# Patient Record
Sex: Female | Born: 1985
Health system: Southern US, Community
[De-identification: ages and names within clinical notes are randomized; demographics above are authoritative.]

## PROBLEM LIST (undated history)

## (undated) ENCOUNTER — Inpatient Hospital Stay (HOSPITAL_COMMUNITY): Payer: Self-pay

## (undated) DIAGNOSIS — D649 Anemia, unspecified: Secondary | ICD-10-CM

## (undated) DIAGNOSIS — A64 Unspecified sexually transmitted disease: Secondary | ICD-10-CM

## (undated) DIAGNOSIS — R7301 Impaired fasting glucose: Secondary | ICD-10-CM

## (undated) DIAGNOSIS — N83209 Unspecified ovarian cyst, unspecified side: Secondary | ICD-10-CM

## (undated) DIAGNOSIS — B009 Herpesviral infection, unspecified: Secondary | ICD-10-CM

## (undated) DIAGNOSIS — O039 Complete or unspecified spontaneous abortion without complication: Secondary | ICD-10-CM

## (undated) DIAGNOSIS — Z8249 Family history of ischemic heart disease and other diseases of the circulatory system: Secondary | ICD-10-CM

## (undated) DIAGNOSIS — R748 Abnormal levels of other serum enzymes: Secondary | ICD-10-CM

## (undated) DIAGNOSIS — A499 Bacterial infection, unspecified: Secondary | ICD-10-CM

## (undated) DIAGNOSIS — E782 Mixed hyperlipidemia: Secondary | ICD-10-CM

## (undated) DIAGNOSIS — D219 Benign neoplasm of connective and other soft tissue, unspecified: Secondary | ICD-10-CM

## (undated) DIAGNOSIS — B379 Candidiasis, unspecified: Secondary | ICD-10-CM

## (undated) DIAGNOSIS — A539 Syphilis, unspecified: Secondary | ICD-10-CM

## (undated) DIAGNOSIS — R7303 Prediabetes: Secondary | ICD-10-CM

## (undated) HISTORY — DX: Herpesviral infection, unspecified: B00.9

## (undated) HISTORY — DX: Impaired fasting glucose: R73.01

## (undated) HISTORY — DX: Syphilis, unspecified: A53.9

## (undated) HISTORY — DX: Unspecified sexually transmitted disease: A64

## (undated) HISTORY — DX: Unspecified ovarian cyst, unspecified side: N83.209

## (undated) HISTORY — DX: Complete or unspecified spontaneous abortion without complication: O03.9

## (undated) HISTORY — DX: Mixed hyperlipidemia: E78.2

## (undated) HISTORY — DX: Bacterial infection, unspecified: A49.9

## (undated) HISTORY — DX: Family history of ischemic heart disease and other diseases of the circulatory system: Z82.49

## (undated) HISTORY — DX: Candidiasis, unspecified: B37.9

---
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
## 2005-06-23 ENCOUNTER — Emergency Department (HOSPITAL_COMMUNITY): Admission: EM | Admit: 2005-06-23 | Discharge: 2005-06-23 | Payer: Self-pay | Admitting: Emergency Medicine

## 2006-05-04 ENCOUNTER — Emergency Department (HOSPITAL_COMMUNITY): Admission: EM | Admit: 2006-05-04 | Discharge: 2006-05-04 | Payer: Self-pay | Admitting: Family Medicine

## 2008-08-10 DIAGNOSIS — A539 Syphilis, unspecified: Secondary | ICD-10-CM

## 2008-08-10 HISTORY — DX: Syphilis, unspecified: A53.9

## 2009-06-19 ENCOUNTER — Ambulatory Visit (HOSPITAL_COMMUNITY): Admission: RE | Admit: 2009-06-19 | Discharge: 2009-06-19 | Payer: Self-pay | Admitting: Internal Medicine

## 2010-08-19 ENCOUNTER — Encounter
Admission: RE | Admit: 2010-08-19 | Discharge: 2010-08-19 | Payer: Self-pay | Source: Home / Self Care | Attending: Family Medicine | Admitting: Family Medicine

## 2012-02-25 ENCOUNTER — Emergency Department (HOSPITAL_COMMUNITY)
Admission: EM | Admit: 2012-02-25 | Discharge: 2012-02-26 | Disposition: A | Discharge status: Home / Self Care | Payer: 59 | Attending: Emergency Medicine | Admitting: Emergency Medicine

## 2012-02-25 ENCOUNTER — Encounter (HOSPITAL_COMMUNITY): Payer: Self-pay | Admitting: *Deleted

## 2012-02-25 DIAGNOSIS — J029 Acute pharyngitis, unspecified: Secondary | ICD-10-CM | POA: Insufficient documentation

## 2012-02-25 LAB — RAPID STREP SCREEN (MED CTR MEBANE ONLY): Streptococcus, Group A Screen (Direct): NEGATIVE

## 2012-02-25 NOTE — ED Provider Notes (Signed)
History     CSN: [id number]  Arrival date & time 02/25/12  2104   First MD Initiated Contact with Patient 02/25/12 2328      Chief Complaint  Patient presents with  . Sore Throat    (Consider location/radiation/quality/duration/timing/severity/associated sxs/prior treatment) Patient is a 26 y.o. female presenting with pharyngitis.  Sore Throat This is a new problem. The current episode started in the past 7 days. The problem occurs constantly. The problem has been gradually worsening. Associated symptoms include coughing and a sore throat. Pertinent negatives include no abdominal pain, anorexia, chest pain, chills, congestion, diaphoresis, fatigue, fever, headaches, myalgias, neck pain, numbness, rash, swollen glands, urinary symptoms, vertigo, visual change, vomiting or weakness. The symptoms are aggravated by drinking, coughing and swallowing. 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 The treatment provided mild relief.    History reviewed. No pertinent past medical history.  History reviewed. No pertinent past surgical history.  History reviewed. No pertinent family history.  History  Substance Use Topics  . Smoking status: Never Smoker   . Smokeless tobacco: Not on file  . Alcohol Use: No    OB History    Grav Para Term Preterm Abortions TAB SAB Ect Mult Living                  Review of Systems  Constitutional: Negative for fever, chills, diaphoresis and fatigue.  HENT: Positive for sore throat. Negative for congestion and neck pain.   Respiratory: Positive for cough.   Cardiovascular: Negative for chest pain.  Gastrointestinal: Negative for vomiting, abdominal pain and anorexia.  Musculoskeletal: Negative for myalgias.  Skin: Negative for rash.  Neurological: Negative for vertigo, weakness, numbness and headaches.  All other systems reviewed and are negative.    Allergies  Review of patient's allergies indicates no known allergies.  Home Medications   Current  Outpatient Rx  Name Route Sig Dispense Refill  . ALKA-SELTZER ANTACID PO Oral Take 2 tablets by mouth once.      BP 127/83  Pulse 68  Temp 98.6 F (37 C) (Oral)  Resp 16  SpO2 99%  Physical Exam  Nursing note and vitals reviewed. Constitutional: She is oriented to person, place, and time. She appears well-developed and well-nourished. No distress.  HENT:  Head: Normocephalic and atraumatic. No trismus in the jaw.  Right Ear: Tympanic membrane, external ear and ear canal normal.  Left Ear: Tympanic membrane, external ear and ear canal normal.  Nose: Nose normal. No rhinorrhea. Right sinus exhibits no maxillary sinus tenderness and no frontal sinus tenderness. Left sinus exhibits no maxillary sinus tenderness and no frontal sinus tenderness.  Mouth/Throat: Uvula is midline and mucous membranes are normal. Normal dentition. No dental abscesses or uvula swelling. No oropharyngeal exudate, posterior oropharyngeal edema, posterior oropharyngeal erythema or tonsillar abscesses.       No submental edema, tongue not elevated, no trismus. No impending airway obstruction; Pt able to speak full sentences, swallow intact, no drooling, stridor, or tonsillar/uvula displacement. No palatal petechia  Eyes: Conjunctivae are normal.  Neck: Trachea normal, normal range of motion and full passive range of motion without pain. Neck supple. No rigidity. No erythema and normal range of motion present. No Brudzinski's sign noted.       Flexion and extension of neck without pain or difficulty. Able to breath without difficulty in extension.  Cardiovascular: Normal rate and regular rhythm.   Pulmonary/Chest: Effort normal and breath sounds normal. No stridor. No respiratory distress. She has no wheezes.  Abdominal:  Soft. There is no tenderness.       No obvious evidence of splenomegaly. Non ttp.   Musculoskeletal: Normal range of motion.  Lymphadenopathy:       Head (right side): No preauricular and no posterior  auricular adenopathy present.       Head (left side): No preauricular and no posterior auricular adenopathy present.    She has no cervical adenopathy.  Neurological: She is alert and oriented to person, place, and time.  Skin: Skin is warm and dry. No rash noted. She is not diaphoretic.  Psychiatric: She has a normal mood and affect.    ED Course  Procedures (including critical care time)   Labs Reviewed  RAPID STREP SCREEN   No results found.   No diagnosis found.    MDM   Pt afebrile without tonsillar exudate, negative strep. Presents with mild cervical lymphadenopathy, & dysphagia; diagnosis of viral pharyngitis. No abx indicated. DC w symptomatic tx for pain  Pt does not appear dehydrated, but did discuss importance of water rehydration. Presentation non concerning for PTA or infxn spread to soft tissue. No trismus or uvula deviation. Specific return precautions discussed. Pt able to drink water in ED without difficulty with intact air way. Recommended PCP follow up.         Jaci Carrel, [place] 02/26/12 0003

## 2012-02-25 NOTE — ED Notes (Signed)
Patient with sore throat for about a week.  Patient denies fever

## 2012-02-26 MED ORDER — HYDROCODONE-HOMATROPINE 5-1.5 MG/5ML PO SYRP: 5.0000 mL | ORAL_SOLUTION | Freq: Four times a day (QID) | ORAL | Status: AC | PRN

## 2012-02-26 NOTE — ED Notes (Signed)
Rx x 1, pt voiced understanding to f/u with PCP and return with worsening sx.

## 2012-03-01 NOTE — ED Provider Notes (Signed)
Medical screening examination/treatment/procedure(s) were performed by non-physician practitioner and as supervising physician I was immediately available for consultation/collaboration.  Jaegar Croft K Taurus Willis, MD 03/01/12 0721 

## 2012-04-27 ENCOUNTER — Ambulatory Visit (INDEPENDENT_AMBULATORY_CARE_PROVIDER_SITE_OTHER): Payer: 59 | Admitting: Obstetrics and Gynecology

## 2012-04-27 ENCOUNTER — Encounter: Payer: Self-pay | Admitting: Obstetrics and Gynecology

## 2012-04-27 VITALS — BP 112/64 | Temp 98.7°F | Ht 61.0 in | Wt 187.0 lb

## 2012-04-27 DIAGNOSIS — Z23 Encounter for immunization: Secondary | ICD-10-CM

## 2012-04-27 DIAGNOSIS — A749 Chlamydial infection, unspecified: Secondary | ICD-10-CM

## 2012-04-27 DIAGNOSIS — B977 Papillomavirus as the cause of diseases classified elsewhere: Secondary | ICD-10-CM | POA: Insufficient documentation

## 2012-04-27 DIAGNOSIS — B009 Herpesviral infection, unspecified: Secondary | ICD-10-CM | POA: Insufficient documentation

## 2012-04-27 DIAGNOSIS — A539 Syphilis, unspecified: Secondary | ICD-10-CM | POA: Insufficient documentation

## 2012-04-27 DIAGNOSIS — Z124 Encounter for screening for malignant neoplasm of cervix: Secondary | ICD-10-CM

## 2012-04-27 DIAGNOSIS — A549 Gonococcal infection, unspecified: Secondary | ICD-10-CM | POA: Insufficient documentation

## 2012-04-27 DIAGNOSIS — A5400 Gonococcal infection of lower genitourinary tract, unspecified: Secondary | ICD-10-CM

## 2012-04-27 DIAGNOSIS — N83209 Unspecified ovarian cyst, unspecified side: Secondary | ICD-10-CM | POA: Insufficient documentation

## 2012-04-27 NOTE — Progress Notes (Signed)
AEX:  Last Pap: 2011 WNL: Yes Regular Periods:yes Contraception: None  Monthly Breast exam:no Tetanus<88yrs:yes Nl.Bladder Function:yes Daily BMs:yes Healthy Diet:yes Calcium:no Mammogram:no Date of Mammogram: n/a Exercise:yes Have often Exercise: occasional Seatbelt: yes Abuse at home: no Stressful work:yes Sigmoid-colonoscopy: n/a Bone Density: No PCP: None Change in PMH: None Change in [facility]: None  Subjective:    Laura Cross is a 26 y.o. female, G0P0000, who presents for an annual exam.     History   Social History  . Marital Status: Single    Spouse Name: N/A    Number of Children: N/A  . Years of Education: N/A   Social History Main Topics  . Smoking status: Never Smoker   . Smokeless tobacco: Never Used  . Alcohol Use: Yes  . Drug Use: No  . Sexually Active: Not Currently    Birth Control/ Protection: Abstinence   Other Topics Concern  . None   Social History Narrative  . None    Menstrual cycle:   LMP: Patient's last menstrual period was 04/06/2012.           Cycle: Regular but heavy and painful with passage of clots  The following portions of the patient's history were reviewed and updated as appropriate: allergies, current medications, past family history, past medical history, past social history, past surgical history and problem list.  Review of Systems Pertinent items are noted in HPI. Breast:Negative for breast lump,nipple discharge or nipple retraction Gastrointestinal: Negative for abdominal pain, change in bowel habits or rectal bleeding Urinary:negative   Objective:    BP 112/64  Temp 98.7 F (37.1 C) (Oral)  Ht 5\' 1"  (1.549 m)  Wt 187 lb (84.823 kg)  BMI 35.33 kg/m2  LMP 04/06/2012    Weight:  Wt Readings from Last 1 Encounters:  04/27/12 187 lb (84.823 kg)          BMI: Body mass index is 35.33 kg/(m^2).  General Appearance: Alert, appropriate appearance for age. No acute distress.  Multiple tatoos HEENT: Grossly  normal Neck / Thyroid: Supple, no masses, nodes or enlargement Lungs: clear to auscultation bilaterally Back: No CVA tenderness Breast Exam: No masses or nodes.No dimpling, nipple retraction or discharge. Cardiovascular: Regular rate and rhythm. S1, S2, no murmur Gastrointestinal: Soft, non-tender, no masses or organomegaly Pelvic Exam: Vulva and vagina appear normal. Bimanual exam reveals normal uterus and adnexa. Exam limited by body habitus Rectovaginal: normal rectal, no masses Lymphatic Exam: Non-palpable nodes in neck, clavicular, axillary, or inguinal regions Skin: no rash or abnormalities Neurologic: Normal gait and speech, no tremor  Psychiatric: Alert and oriented, appropriate affect.   Wet Prep:not applicable Urinalysis:not applicable UPT: Not done   Assessment:    Hx HPV and multiple other STD's  Menorrhagia and dysmenorrhea Needs and desires contraception  Plan:    pap smear additional lab tests per orders return annually or prn Written info given for LARCs per pt request.  Doesn't do well with daily methods per her report STD screening: done Contraception:TBD gardasil vaccination offered and accepted.  Will precert      Dierdre Forth, MD

## 2012-04-27 NOTE — Patient Instructions (Addendum)

## 2012-04-28 LAB — RPR

## 2012-04-28 LAB — HIV ANTIBODY (ROUTINE TESTING W REFLEX): HIV: NONREACTIVE

## 2012-04-29 LAB — PAP IG, CT-NG, RFX HPV ASCU
Chlamydia Probe Amp: NEGATIVE
GC Probe Amp: NEGATIVE

## 2012-06-27 ENCOUNTER — Other Ambulatory Visit (INDEPENDENT_AMBULATORY_CARE_PROVIDER_SITE_OTHER): Payer: 59

## 2012-06-27 DIAGNOSIS — Z23 Encounter for immunization: Secondary | ICD-10-CM

## 2012-06-27 MED ORDER — HPV QUADRIVALENT VACCINE IM SUSP
0.5000 mL | Freq: Once | INTRAMUSCULAR | Status: AC
  Administered 2012-06-27: 0.5 mL via INTRAMUSCULAR

## 2012-08-10 DIAGNOSIS — O039 Complete or unspecified spontaneous abortion without complication: Secondary | ICD-10-CM

## 2012-08-10 HISTORY — DX: Complete or unspecified spontaneous abortion without complication: O03.9

## 2012-09-12 ENCOUNTER — Ambulatory Visit (INDEPENDENT_AMBULATORY_CARE_PROVIDER_SITE_OTHER): Payer: 59 | Admitting: Family Medicine

## 2012-09-12 VITALS — BP 127/83 | HR 83 | Temp 97.9°F | Resp 16 | Ht 61.0 in | Wt 186.0 lb

## 2012-09-12 DIAGNOSIS — R059 Cough, unspecified: Secondary | ICD-10-CM

## 2012-09-12 DIAGNOSIS — J40 Bronchitis, not specified as acute or chronic: Secondary | ICD-10-CM

## 2012-09-12 DIAGNOSIS — R05 Cough: Secondary | ICD-10-CM

## 2012-09-12 MED ORDER — AZITHROMYCIN 250 MG PO TABS: ORAL_TABLET | ORAL | Status: DC

## 2012-09-12 NOTE — Patient Instructions (Addendum)
Use the antibiotic as directed.  If you are not better in the next several days please let me know- Sooner if worse.

## 2012-09-12 NOTE — Progress Notes (Signed)
[facility] [address] [place] [address] [phone]  Date:  09/12/2012   Name:  Laura Cross   DOB:  1985-09-22   MRN:  [id number]  PCP:  Default, Provider, MD    Chief Complaint: Cough and Nasal Congestion   History of Present Illness:  Laura Cross is a 27 y.o. very pleasant female patient who presents with the following:  Of note the problem list below seems to be in error- it lists the tests she has had, not diagnoses. Corrected this list for her She is here today with illness- she has been coughing for about one month.  The cough got even worse last night.  It had been productive, but is now dry.  She states these symptoms are just like when she had bronchitis in college She has not noted fever or chills. She does have body aches off an on- these were worst last night.   She does have a runny and stuffy nose.  She did have a ST but this is now resolved, no earache No GI symptoms.    Her LMP was about 3 weeks ago.   She is taking mucinex, but not other medications at this time.   Patient Active Problem List  Diagnosis  . HPV (human papilloma virus) infection  . Chlamydia  . Gonorrhea  . Herpes  . Syphilis  . Ovarian cyst    Past Medical History  Diagnosis Date  . Yeast infection   . Bacterial infection   . Chlamydia   . Gonorrhea   . HPV (human papilloma virus) infection   . Herpes   . Syphilis   . Ovarian cyst     No past surgical history on file.  History  Substance Use Topics  . Smoking status: Never Smoker   . Smokeless tobacco: Never Used  . Alcohol Use: Yes    Family History  Problem Relation Age of Onset  . Heart disease Father   . Hypertension Maternal Grandmother   . Hypertension Maternal Grandfather   . Diabetes Maternal Grandmother   . Stroke Maternal Grandmother     Allergies  Allergen Reactions  . Nuvaring (Etonogestrel-Ethinyl Estradiol)     Flu-like symptoms    Medication list has been  reviewed and updated.  No current outpatient prescriptions on file prior to visit.    Review of Systems:  As per HPI- otherwise negative.   Physical Examination: Filed Vitals:   09/12/12 0932  BP: 127/83  Pulse: 83  Temp: 97.9 F (36.6 C)  Resp: 16   Filed Vitals:   09/12/12 0932  Height: 5\' 1"  (1.549 m)  Weight: 186 lb (84.369 kg)   Body mass index is 35.14 kg/(m^2). Ideal Body Weight: Weight in (lb) to have BMI = 25: 132   GEN: WDWN, NAD, Non-toxic, A & O x 3, obese HEENT: Atraumatic, Normocephalic. Neck supple. No masses, No LAD.  Bilateral TM wnl, oropharynx normal.  PEERL,EOMI.  Nasal congestion Ears and Nose: No external deformity. CV: RRR, No M/G/R. No JVD. No thrill. No extra heart sounds. PULM: CTA B, no wheezes, crackles, rhonchi. No retractions. No resp. distress. No accessory muscle use. ABD: S, NT, ND EXTR: No c/c/e NEURO Normal gait.  PSYCH: Normally interactive. Conversant. Not depressed or anxious appearing.  Calm demeanor.    Assessment and Plan: 1. Cough  azithromycin (ZITHROMAX) 250 MG tablet  2. Bronchitis     Treat as above- let us know if not  better in the next few days. Corrected her problem list, VS and exam are reassuring today  Kien Mirsky, MD

## 2012-12-03 ENCOUNTER — Ambulatory Visit (INDEPENDENT_AMBULATORY_CARE_PROVIDER_SITE_OTHER): Payer: 59 | Admitting: Family Medicine

## 2012-12-03 ENCOUNTER — Ambulatory Visit: Payer: 59

## 2012-12-03 VITALS — BP 122/82 | HR 87 | Temp 98.9°F | Resp 17 | Ht 61.0 in | Wt 187.0 lb

## 2012-12-03 DIAGNOSIS — R0789 Other chest pain: Secondary | ICD-10-CM

## 2012-12-03 DIAGNOSIS — S139XXA Sprain of joints and ligaments of unspecified parts of neck, initial encounter: Secondary | ICD-10-CM

## 2012-12-03 DIAGNOSIS — R071 Chest pain on breathing: Secondary | ICD-10-CM

## 2012-12-03 DIAGNOSIS — S161XXA Strain of muscle, fascia and tendon at neck level, initial encounter: Secondary | ICD-10-CM

## 2012-12-03 MED ORDER — MELOXICAM 7.5 MG PO TABS: 7.5000 mg | ORAL_TABLET | Freq: Every day | ORAL | Status: DC

## 2012-12-03 NOTE — Patient Instructions (Signed)
Heat, gentle range of motion and stretches, and either over the counter advil OR prescribed meloxicam. Return to the clinic or go to the nearest emergency room if any of your symptoms worsen or new symptoms occur.

## 2012-12-03 NOTE — Progress Notes (Signed)
  Subjective:    Patient ID: Laura Cross, female    DOB: 1986/05/14, 27 y.o.   MRN: [id number]  HPI Laura Cross is a 27 y.o. female  R upper chest sharp pains, noted this morning with lifting arm, and radiates to R neck.  Noticed all week - nki, but had move last week  - more heavy lifting, twisting with move, but does not remember one specific injury. No heartburn.  Felt worse after lying on R side last night. Not dyspneic, but sore/pulling with deep breath. No n/v/fever. No neck pain with mvt, sore to lift R arm.   Tx: none.    No recent prolonged car travel or air travel, no recent calf pain or swelling   Review of Systems  Constitutional: Negative for fever and chills.  Respiratory: Negative for shortness of breath.   Cardiovascular: Positive for chest pain.       Objective:   Physical Exam  Constitutional: She is oriented to person, place, and time. She appears well-developed and well-nourished.  HENT:  Head: Normocephalic and atraumatic.  Eyes: Conjunctivae and EOM are normal. Pupils are equal, round, and reactive to light.  Neck: Carotid bruit is not present.  Cardiovascular: Normal rate, regular rhythm, normal heart sounds and intact distal pulses.   Pulmonary/Chest: Effort normal and breath sounds normal. No respiratory distress. She has no wheezes.  Clear bs in all lung fields including anteriorly. Notes R upper chest soreness with deep inspiration  Abdominal: Soft. She exhibits no pulsatile midline mass. There is no tenderness.  Musculoskeletal:       Cervical back: She exhibits decreased range of motion (from, but slight R lateral neck pain with L lateral flex/rotn. ). She exhibits no tenderness, no bony tenderness and no spasm.  Calves nt, no swelling.   Neurological: She is alert and oriented to person, place, and time.  Skin: Skin is warm and dry.  Psychiatric: She has a normal mood and affect. Her behavior is normal.   [facility] reading (PRIMARY) by  Dr.  Neva Seat: CXR - no apparent pneumothorax, no acute findings.       Assessment & Plan:  Laura Cross is a 27 y.o. female Chest wall pain - Plan: DG Chest 2 View, meloxicam (MOBIC) 7.5 MG tablet  Neck strain, initial encounter - Plan: meloxicam (MOBIC) 7.5 MG tablet   Suspected R sided neck/chest wall strain with lifting. Sx care with gentle stretches/rom, heat or cool compresses as needed and mobic QD prn.  Recheck if not improving next week - sooner if worse.  Understanding expressed.   Meds ordered this encounter  Medications  . Multiple Vitamins-Minerals (MULTIVITAMIN WITH MINERALS) tablet    Sig: Take 1 tablet by mouth daily.  . meloxicam (MOBIC) 7.5 MG tablet    Sig: Take 1 tablet (7.5 mg total) by mouth daily.    Dispense:  30 tablet    Refill:  0   Patient Instructions  Heat, gentle range of motion and stretches, and either over the counter advil OR prescribed meloxicam. Return to the clinic or go to the nearest emergency room if any of your symptoms worsen or new symptoms occur.

## 2012-12-21 ENCOUNTER — Ambulatory Visit: Payer: 59 | Attending: Family Medicine | Admitting: Family Medicine

## 2012-12-21 VITALS — BP 134/82 | HR 82 | Temp 99.1°F | Resp 18 | Ht 61.0 in | Wt 187.0 lb

## 2012-12-21 DIAGNOSIS — R5383 Other fatigue: Secondary | ICD-10-CM

## 2012-12-21 DIAGNOSIS — N83209 Unspecified ovarian cyst, unspecified side: Secondary | ICD-10-CM | POA: Insufficient documentation

## 2012-12-21 DIAGNOSIS — R5381 Other malaise: Secondary | ICD-10-CM

## 2012-12-21 DIAGNOSIS — B977 Papillomavirus as the cause of diseases classified elsewhere: Secondary | ICD-10-CM | POA: Insufficient documentation

## 2012-12-21 DIAGNOSIS — B009 Herpesviral infection, unspecified: Secondary | ICD-10-CM | POA: Insufficient documentation

## 2012-12-21 DIAGNOSIS — E860 Dehydration: Secondary | ICD-10-CM

## 2012-12-21 DIAGNOSIS — N920 Excessive and frequent menstruation with regular cycle: Secondary | ICD-10-CM

## 2012-12-21 DIAGNOSIS — N898 Other specified noninflammatory disorders of vagina: Secondary | ICD-10-CM

## 2012-12-21 NOTE — Progress Notes (Signed)
Patient ID: Laura Cross, female   DOB: August 13, 1985, 27 y.o.   MRN: [id number] CC:  HPI: Pt reports 2 weeks of having a heavy menstrual period.  She says that she is having vaginal discharge and wanting to have her STD testing done as she has had a history of STD in past.  Pt says she has had some nausea and vomiting.  Pt has her own gynecologist but reports that she wanted to have her blood tested for anemia and reporting that she is wanting to check her blood. Pt is sexually active and not taking any contraception at this time.  Pt denies fever or chills.  No diarrhea.     Allergies  Allergen Reactions  . Nuvaring (Etonogestrel-Ethinyl Estradiol)     Flu-like symptoms   Past Medical History  Diagnosis Date  . Yeast infection   . Bacterial infection   . Chlamydia   . Gonorrhea   . HPV (human papilloma virus) infection   . Herpes   . Syphilis   . Ovarian cyst    Current Outpatient Prescriptions on File Prior to Visit  Medication Sig Dispense Refill  . meloxicam (MOBIC) 7.5 MG tablet Take 1 tablet (7.5 mg total) by mouth daily.  30 tablet  0  . Multiple Vitamins-Minerals (MULTIVITAMIN WITH MINERALS) tablet Take 1 tablet by mouth daily.       No current facility-administered medications on file prior to visit.   Family History  Problem Relation Age of Onset  . Heart disease Father   . Hypertension Maternal Grandmother   . Diabetes Maternal Grandmother   . Stroke Maternal Grandmother   . Hypertension Maternal Grandfather   . GER disease Mother    History   Social History  . Marital Status: Single    Spouse Name: N/A    Number of Children: N/A  . Years of Education: N/A   Occupational History  . Not on file.   Social History Main Topics  . Smoking status: Never Smoker   . Smokeless tobacco: Never Used  . Alcohol Use: Yes  . Drug Use: No  . Sexually Active: Not Currently    Birth Control/ Protection: Abstinence   Other Topics Concern  . Not on file   Social  History Narrative  . No narrative on file    Review of Systems  Constitutional: Negative for fever, chills, diaphoresis, activity change, appetite change and fatigue.  HENT: Negative for ear pain, nosebleeds, congestion, facial swelling, rhinorrhea, neck pain, neck stiffness and ear discharge.   Eyes: Negative for pain, discharge, redness, itching and visual disturbance.  Respiratory: Negative for cough, choking, chest tightness, shortness of breath, wheezing and stridor.   Cardiovascular: Negative for chest pain, palpitations and leg swelling.  Gastrointestinal: Negative for abdominal distention.  Genitourinary: Negative for dysuria, urgency, frequency, hematuria, flank pain, decreased urine volume, difficulty urinating and dyspareunia.  Musculoskeletal: Negative for back pain, joint swelling, arthralgias and gait problem.  Neurological: Negative for dizziness, tremors, seizures, syncope, facial asymmetry, speech difficulty, weakness, light-headedness, numbness and headaches.  Hematological: Negative for adenopathy. Does not bruise/bleed easily.  Psychiatric/Behavioral: Negative for hallucinations, behavioral problems, confusion, dysphoric mood, decreased concentration and agitation.    Objective:   Filed Vitals:   12/21/12 1008  BP: 134/82  Pulse: 82  Temp: 99.1 F (37.3 C)  Resp: 18    Physical Exam  Constitutional: Appears well-developed and well-nourished. No distress.  HENT: Normocephalic. External right and left ear normal. Oropharynx is clear and moist.  Eyes: Conjunctivae and EOM are normal. PERRLA, no scleral icterus.  Neck: Normal ROM. Neck supple. No JVD. No tracheal deviation. No thyromegaly.  CVS: RRR, S1/S2 +, no murmurs, no gallops, no carotid bruit.  Pulmonary: Effort and breath sounds normal, no stridor, rhonchi, wheezes, rales.  Abdominal: Soft. BS +,  no distension, tenderness, rebound or guarding.  Musculoskeletal: Normal range of motion. No edema and no  tenderness.  Lymphadenopathy: No lymphadenopathy noted, cervical, inguinal. Neuro: Alert. Normal reflexes, muscle tone coordination. No cranial nerve deficit. Skin: Skin is warm and dry. No rash noted. Not diaphoretic. No erythema. No pallor.  Psychiatric: Normal mood and affect. Behavior, judgment, thought content normal.   No results found for this basename: WBC, HGB, HCT, MCV, PLT   No results found for this basename: CREATININE, BUN, NA, K, CL, CO2    No results found for this basename: HGBA1C        Assessment:   Patient Active Problem List   Diagnosis Date Noted  . HPV (human papilloma virus) infection   . Herpes   . Ovarian cyst         Plan:     Pt says that she had a normal PAP back in September.   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  I gave her a written Rx with instructions to fax results to our office.  Pt told to please call if she has not heard of results in 1 week.   The patient was given clear instructions to go to ER or return to medical center if symptoms don't improve, worsen or new problems develop.  The patient verbalized understanding.  The patient was told to call to get lab results if they haven't heard anything in the next week.      Rodney Langton, MD, CDE, FAAFP [facility] [place], [place]

## 2012-12-21 NOTE — Patient Instructions (Addendum)

## 2012-12-21 NOTE — Progress Notes (Signed)
Patient present with heavy menstrual cycle x2 weeks

## 2012-12-29 ENCOUNTER — Encounter (HOSPITAL_COMMUNITY): Payer: Self-pay | Admitting: Emergency Medicine

## 2012-12-29 ENCOUNTER — Emergency Department (HOSPITAL_COMMUNITY): Payer: 59

## 2012-12-29 ENCOUNTER — Inpatient Hospital Stay (HOSPITAL_COMMUNITY): Payer: 59

## 2012-12-29 ENCOUNTER — Inpatient Hospital Stay (HOSPITAL_COMMUNITY)
Admission: EM | Admit: 2012-12-29 | Discharge: 2012-12-29 | Disposition: A | Discharge status: Home / Self Care | Payer: 59 | Attending: Obstetrics and Gynecology | Admitting: Obstetrics and Gynecology

## 2012-12-29 DIAGNOSIS — O039 Complete or unspecified spontaneous abortion without complication: Secondary | ICD-10-CM | POA: Insufficient documentation

## 2012-12-29 DIAGNOSIS — O209 Hemorrhage in early pregnancy, unspecified: Secondary | ICD-10-CM | POA: Insufficient documentation

## 2012-12-29 LAB — CBC WITH DIFFERENTIAL/PLATELET
Basophils Absolute: 0 10*3/uL (ref 0.0–0.1)
Basophils Relative: 0 % (ref 0–1)
Eosinophils Absolute: 0 10*3/uL (ref 0.0–0.7)
Eosinophils Relative: 1 % (ref 0–5)
HCT: 34 % — ABNORMAL LOW (ref 36.0–46.0)
Hemoglobin: 11.5 g/dL — ABNORMAL LOW (ref 12.0–15.0)
Lymphocytes Relative: 46 % (ref 12–46)
Lymphs Abs: 2.9 10*3/uL (ref 0.7–4.0)
MCH: 27.1 pg (ref 26.0–34.0)
MCHC: 33.8 g/dL (ref 30.0–36.0)
MCV: 80 fL (ref 78.0–100.0)
Monocytes Absolute: 0.5 10*3/uL (ref 0.1–1.0)
Monocytes Relative: 8 % (ref 3–12)
Neutro Abs: 2.8 10*3/uL (ref 1.7–7.7)
Neutrophils Relative %: 46 % (ref 43–77)
Platelets: 233 10*3/uL (ref 150–400)
RBC: 4.25 MIL/uL (ref 3.87–5.11)
RDW: 14.7 % (ref 11.5–15.5)
WBC: 6.2 10*3/uL (ref 4.0–10.5)

## 2012-12-29 LAB — COMPREHENSIVE METABOLIC PANEL
ALT: 31 U/L (ref 0–35)
Albumin: 3.4 g/dL — ABNORMAL LOW (ref 3.5–5.2)
Alkaline Phosphatase: 63 U/L (ref 39–117)
Calcium: 9.2 mg/dL (ref 8.4–10.5)
GFR calc Af Amer: >90 mL/min (ref >90)
Glucose, Bld: 111 mg/dL — ABNORMAL HIGH (ref 70–99)
Potassium: 3.7 mEq/L (ref 3.5–5.1)
Sodium: 134 mEq/L — ABNORMAL LOW (ref 135–145)
Total Protein: 7.1 g/dL (ref 6.0–8.3)

## 2012-12-29 LAB — URINALYSIS, ROUTINE W REFLEX MICROSCOPIC
Bilirubin Urine: NEGATIVE
Hgb urine dipstick: NEGATIVE
Ketones, ur: NEGATIVE mg/dL
Nitrite: NEGATIVE
Protein, ur: NEGATIVE mg/dL
Specific Gravity, Urine: 1.027 (ref 1.005–1.030)
Urobilinogen, UA: 0.2 mg/dL (ref 0.0–1.0)

## 2012-12-29 LAB — ABO/RH: ABO/RH(D): O POS

## 2012-12-29 LAB — HCG, QUANTITATIVE, PREGNANCY: hCG, Beta Chain, Quant, S: 53141 m[IU]/mL — ABNORMAL HIGH (ref <5)

## 2012-12-29 MED ORDER — OXYCODONE-ACETAMINOPHEN 5-325 MG PO TABS
1.0000 | ORAL_TABLET | Freq: Once | ORAL | Status: AC
  Administered 2012-12-29: 1 via ORAL
  Filled 2012-12-29: qty 1

## 2012-12-29 MED ORDER — ONDANSETRON 4 MG PO TBDP
4.0000 mg | ORAL_TABLET | Freq: Once | ORAL | Status: AC
  Administered 2012-12-29: 4 mg via ORAL
  Filled 2012-12-29: qty 1

## 2012-12-29 MED ORDER — SODIUM CHLORIDE 0.9 % IV BOLUS (SEPSIS)
500.0000 mL | Freq: Once | INTRAVENOUS | Status: AC
  Administered 2012-12-29: 500 mL via INTRAVENOUS

## 2012-12-29 NOTE — ED Notes (Signed)
Pt transferred from [facility]. Report received from [facility]. Pt reports minimal pain now and vag bleeding a small amount on pad. Repeat U/S ordered.

## 2012-12-29 NOTE — ED Notes (Signed)
Report given to [facility] and [facility].  Pt transported via stretcher to MAU

## 2012-12-29 NOTE — MAU Provider Note (Signed)
Attestation of Attending Supervision of Advanced Practitioner (PA/CNM/NP): Evaluation and management procedures were performed by the Advanced Practitioner under my supervision and collaboration.  I have reviewed the Advanced Practitioner's note and chart, and I agree with the management and plan.  Dhani Imel, MD, FACOG Attending Obstetrician & Gynecologist Faculty Practice, [facility]  

## 2012-12-29 NOTE — ED Notes (Signed)
PT. REPORTS VAGINAL BLEEDING X 1 MONTH GOT WORSE THIS EVENING WITH BLOOD CLOTS AND LOW ABDOMINAL CRAMPING .

## 2012-12-29 NOTE — ED Notes (Signed)
NT showed urine sample, pt has bright red blood with blood clots in her urine. Unable to perform testing at this time.

## 2012-12-29 NOTE — ED Notes (Signed)
Pt states she has had heavy menses for past month Intermittently  On 5.21 she began have heavy discharge 3-4 pads/ hr and crampy abd pain.  Pain now rated 10/10  Associated with nausea and vomiting.  Denies diarrhea. Active bowel sounds..  Abd soft  LMP 4/21

## 2012-12-29 NOTE — ED Provider Notes (Signed)
History     CSN: [id number]  Arrival date & time 12/29/12  0219   First MD Initiated Contact with Patient 12/29/12 [phone]      Chief Complaint  Patient presents with  . Vaginal Bleeding    (Consider location/radiation/quality/duration/timing/severity/associated sxs/prior treatment) Patient is a 27 y.o. female presenting with vaginal bleeding. The history is provided by the patient. No language interpreter was used.  Vaginal Bleeding Quality:  Clots and dark red Severity:  Moderate Onset quality:  Gradual Timing:  Constant Progression:  Worsening Chronicity:  New Menstrual history:  Regular Possible pregnancy: yes   Context: not genital trauma   Relieved by:  Nothing Worsened by:  Activity Ineffective treatments:  None tried Associated symptoms: nausea   Risk factors: no hx of ectopic pregnancy   Patient now G1 P0--- Bleeding x 1 month, spotted 4/21 for 3-4 days seen for same at clinic on the 15th.  Blood tests done but no pelvic or urine preg.  Passing clots and cramping x 24 hours  Past Medical History  Diagnosis Date  . Yeast infection   . Bacterial infection   . Chlamydia   . Gonorrhea   . HPV (human papilloma virus) infection   . Herpes   . Syphilis   . Ovarian cyst     History reviewed. No pertinent past surgical history.  Family History  Problem Relation Age of Onset  . Heart disease Father   . Hypertension Maternal Grandmother   . Diabetes Maternal Grandmother   . Stroke Maternal Grandmother   . Hypertension Maternal Grandfather   . GER disease Mother     History  Substance Use Topics  . Smoking status: Never Smoker   . Smokeless tobacco: Never Used  . Alcohol Use: Yes    OB History   Grav Para Term Preterm Abortions TAB SAB Ect Mult Living   0 0 0 0 0 0 0 0 0 0       Review of Systems  Gastrointestinal: Positive for nausea.  Genitourinary: Positive for vaginal bleeding.  All other systems reviewed and are negative.    Allergies   Meloxicam and Nuvaring  Home Medications   Current Outpatient Rx  Name  Route  Sig  Dispense  Refill  . Multiple Vitamins-Minerals (MULTIVITAMIN WITH MINERALS) tablet   Oral   Take 1 tablet by mouth daily.           BP 123/66  Pulse 92  Temp(Src) 98.4 F (36.9 C) (Oral)  Resp 18  SpO2 99%  LMP 11/28/2012  Physical Exam  Constitutional: She is oriented to person, place, and time. She appears well-developed and well-nourished. No distress.  HENT:  Head: Normocephalic and atraumatic.  Mouth/Throat: Oropharynx is clear and moist.  Eyes: Conjunctivae are normal. Pupils are equal, round, and reactive to light.  Neck: Normal range of motion. Neck supple.  Cardiovascular: Normal rate, regular rhythm and intact distal pulses.   Pulmonary/Chest: Effort normal and breath sounds normal. She has no wheezes. She has no rales.  Abdominal: Soft. Bowel sounds are normal. There is no tenderness. There is no rebound and no guarding.  Genitourinary: Uterus is enlarged and tender. No vaginal discharge found.  Bleeding chaperone present os closed  Musculoskeletal: Normal range of motion.  Neurological: She is alert and oriented to person, place, and time.  Skin: Skin is warm and dry.  Psychiatric: She has a normal mood and affect.    ED Course  Procedures (including critical care time)  Labs Reviewed  CBC WITH DIFFERENTIAL - Abnormal; Notable for the following:    Hemoglobin 11.5 (*)    HCT 34.0 (*)    All other components within normal limits  COMPREHENSIVE METABOLIC PANEL - Abnormal; Notable for the following:    Sodium 134 (*)    Glucose, Bld 111 (*)    Albumin 3.4 (*)    Total Bilirubin 0.2 (*)    All other components within normal limits  POCT PREGNANCY, URINE - Abnormal; Notable for the following:    Preg Test, Ur POSITIVE (*)    All other components within normal limits  GC/CHLAMYDIA PROBE AMP  WET PREP, GENITAL  URINALYSIS, ROUTINE W REFLEX MICROSCOPIC  HCG,  QUANTITATIVE, PREGNANCY  ABO/RH  SURGICAL PATHOLOGY   No results found.   No diagnosis found.    MDM   Results for orders placed during the hospital encounter of 12/29/12  WET PREP, GENITAL      Result Value Range   Yeast Wet Prep HPF POC NONE SEEN  NONE SEEN   Trich, Wet Prep NONE SEEN  NONE SEEN   Clue Cells Wet Prep HPF POC NONE SEEN  NONE SEEN   WBC, Wet Prep HPF POC FEW (*) NONE SEEN  URINALYSIS, ROUTINE W REFLEX MICROSCOPIC      Result Value Range   Color, Urine YELLOW  YELLOW   APPearance CLEAR  CLEAR   Specific Gravity, Urine 1.027  1.005 - 1.030   pH 5.5  5.0 - 8.0   Glucose, UA NEGATIVE  NEGATIVE mg/dL   Hgb urine dipstick NEGATIVE  NEGATIVE   Bilirubin Urine NEGATIVE  NEGATIVE   Ketones, ur NEGATIVE  NEGATIVE mg/dL   Protein, ur NEGATIVE  NEGATIVE mg/dL   Urobilinogen, UA 0.2  0.0 - 1.0 mg/dL   Nitrite NEGATIVE  NEGATIVE   Leukocytes, UA NEGATIVE  NEGATIVE  CBC WITH DIFFERENTIAL      Result Value Range   WBC 6.2  4.0 - 10.5 K/uL   RBC 4.25  3.87 - 5.11 MIL/uL   Hemoglobin 11.5 (*) 12.0 - 15.0 g/dL   HCT 82.9 (*) 56.2 - 13.0 %   MCV 80.0  78.0 - 100.0 fL   MCH 27.1  26.0 - 34.0 pg   MCHC 33.8  30.0 - 36.0 g/dL   RDW 86.5  78.4 - 69.6 %   Platelets 233  150 - 400 K/uL   Neutrophils Relative % 46  43 - 77 %   Neutro Abs 2.8  1.7 - 7.7 K/uL   Lymphocytes Relative 46  12 - 46 %   Lymphs Abs 2.9  0.7 - 4.0 K/uL   Monocytes Relative 8  3 - 12 %   Monocytes Absolute 0.5  0.1 - 1.0 K/uL   Eosinophils Relative 1  0 - 5 %   Eosinophils Absolute 0.0  0.0 - 0.7 K/uL   Basophils Relative 0  0 - 1 %   Basophils Absolute 0.0  0.0 - 0.1 K/uL  COMPREHENSIVE METABOLIC PANEL      Result Value Range   Sodium 134 (*) 135 - 145 mEq/L   Potassium 3.7  3.5 - 5.1 mEq/L   Chloride 101  96 - 112 mEq/L   CO2 20  19 - 32 mEq/L   Glucose, Bld 111 (*) 70 - 99 mg/dL   BUN 6  6 - 23 mg/dL   Creatinine, Ser 2.95  0.50 - 1.10 mg/dL   Calcium 9.2  8.4 - 28.4 mg/dL   Total  Protein 7.1  6.0 - 8.3 g/dL   Albumin 3.4 (*) 3.5 - 5.2 g/dL   AST 30  0 - 37 U/L   ALT 31  0 - 35 U/L   Alkaline Phosphatase 63  39 - 117 U/L   Total Bilirubin 0.2 (*) 0.3 - 1.2 mg/dL   GFR calc non Af Amer >90  >90 mL/min   GFR calc Af Amer >90  >90 mL/min  POCT PREGNANCY, URINE      Result Value Range   Preg Test, Ur POSITIVE (*) NEGATIVE  ABO/RH      Result Value Range   ABO/RH(D) O POS     No rh immune globuloin NOT A RH IMMUNE GLOBULIN CANDIDATE, PT RH POSITIVE     Dg Chest 2 View  12/03/2012   *RADIOLOGY REPORT*  Clinical Data: 27 year old female with chest pain.  CHEST - 2 VIEW  Comparison: None  Findings: Upper limits normal heart size noted. The lungs are clear. There is no evidence of focal airspace disease, pulmonary edema, suspicious pulmonary nodule/mass, pleural effusion, or pneumothorax. No acute bony abnormalities are identified.  IMPRESSION: Upper limits normal heart size without evidence of acute cardiopulmonary disease.   Original Report Authenticated By: Harmon Pier, M.D.   US Ob Comp Less 14 Wks  12/29/2012   *RADIOLOGY REPORT*  Clinical Data: Vaginal bleeding.  Positive urine pregnancy test  OBSTETRIC <14 WK [name] AND TRANSVAGINAL OB US  Technique:  Both transabdominal and transvaginal ultrasound examinations were performed for complete evaluation of the gestation as well as the maternal uterus, adnexal regions, and pelvic cul-de-sac.  Transvaginal technique was performed to assess early pregnancy.  Comparison:  None.  Intrauterine gestational sac:  None Yolk sac: None Embryo: None Cardiac Activity: N/A Heart Rate: N/A bpm  Maternal uterus/adnexae: Normal right ovary. Normal left ovary. Uterus measures 9.6 x 5.3 x 5.8 cm.  The endometrium is markedly thickened measuring 3.1 cm.  Moderate heterogeneity. No free pelvic fluid collections.  IMPRESSION:  1.  No intrauterine gestational sac. 2.  Thickened and mildly heterogeneous endometrium. 3.  Normal ovaries. 4.  No free pelvic  fluid.   Original Report Authenticated By: Rudie Meyer, M.D.   US Ob Transvaginal  12/29/2012   *RADIOLOGY REPORT*  Clinical Data: Vaginal bleeding.  Positive urine pregnancy test  OBSTETRIC <14 WK [name] AND TRANSVAGINAL OB US  Technique:  Both transabdominal and transvaginal ultrasound examinations were performed for complete evaluation of the gestation as well as the maternal uterus, adnexal regions, and pelvic cul-de-sac.  Transvaginal technique was performed to assess early pregnancy.  Comparison:  None.  Intrauterine gestational sac:  None Yolk sac: None Embryo: None Cardiac Activity: N/A Heart Rate: N/A bpm  Maternal uterus/adnexae: Normal right ovary. Normal left ovary. Uterus measures 9.6 x 5.3 x 5.8 cm.  The endometrium is markedly thickened measuring 3.1 cm.  Moderate heterogeneity. No free pelvic fluid collections.  IMPRESSION:  1.  No intrauterine gestational sac. 2.  Thickened and mildly heterogeneous endometrium. 3.  Normal ovaries. 4.  No free pelvic fluid.   Original Report Authenticated By: Rudie Meyer, M.D.    Case d/w Dr. Emelda Fear who will accept the patient in transfer        Stephen Baruch K Lary Eckardt-Rasch, MD 12/29/12 [phone]

## 2012-12-29 NOTE — MAU Provider Note (Signed)
History     CSN: [id number]  Arrival date and time: 12/29/12 0219   None     Chief Complaint  Patient presents with  . Vaginal Bleeding   HPI 27 y.o. G1P0 at Unknown EGA. Sent from [facility] ED for r/o ectopic. Came to ED because of bleeding. Had what she thought was her period on 4/21, which she described as"hemorrhaging", then heavy discharge for the entire month, then started bleeding with clots last night. + cramping earlier, no pain now. Quant was [id number] at [facility], u/s showed no IUP or adnexal mass, thickened endometrium.    Past Medical History  Diagnosis Date  . Yeast infection   . Bacterial infection   . Chlamydia   . Gonorrhea   . HPV (human papilloma virus) infection   . Herpes   . Syphilis   . Ovarian cyst     History reviewed. No pertinent past surgical history.  Family History  Problem Relation Age of Onset  . Heart disease Father   . Hypertension Maternal Grandmother   . Diabetes Maternal Grandmother   . Stroke Maternal Grandmother   . Hypertension Maternal Grandfather   . GER disease Mother     History  Substance Use Topics  . Smoking status: Never Smoker   . Smokeless tobacco: Never Used  . Alcohol Use: Yes    Allergies:  Allergies  Allergen Reactions  . Meloxicam Other (See Comments)    Bleeding  . Nuvaring (Etonogestrel-Ethinyl Estradiol)     Flu-like symptoms    Prescriptions prior to admission  Medication Sig Dispense Refill  . Multiple Vitamins-Minerals (MULTIVITAMIN WITH MINERALS) tablet Take 1 tablet by mouth daily.        Review of Systems  Constitutional: Negative.   Respiratory: Negative.   Cardiovascular: Negative.   Gastrointestinal: Positive for abdominal pain (cramping).  Genitourinary:       + bleeding    Physical Exam   Blood pressure 123/65, pulse 70, temperature 97.3 F (36.3 C), temperature source Oral, resp. rate 20, last menstrual period 11/28/2012, SpO2 100.00%.  Physical Exam  Nursing note and vitals  reviewed. Constitutional: She is oriented to person, place, and time. She appears well-developed and well-nourished. No distress.  Cardiovascular: Normal rate.   Respiratory: Effort normal.  Genitourinary:  Pelvic exam complete at [facility] ED today   Musculoskeletal: Normal range of motion.  Neurological: She is alert and oriented to person, place, and time.  Skin: Skin is warm and dry.  Psychiatric: She has a normal mood and affect.    MAU Course  Procedures Results for orders placed during the hospital encounter of 12/29/12 (from the past 24 hour(s))  CBC WITH DIFFERENTIAL     Status: Abnormal   Collection Time    12/29/12  2:32 AM      Result Value Range   WBC 6.2  4.0 - 10.5 K/uL   RBC 4.25  3.87 - 5.11 MIL/uL   Hemoglobin 11.5 (*) 12.0 - 15.0 g/dL   HCT 78.2 (*) 95.6 - 21.3 %   MCV 80.0  78.0 - 100.0 fL   MCH 27.1  26.0 - 34.0 pg   MCHC 33.8  30.0 - 36.0 g/dL   RDW 08.6  57.8 - 46.9 %   Platelets 233  150 - 400 K/uL   Neutrophils Relative % 46  43 - 77 %   Neutro Abs 2.8  1.7 - 7.7 K/uL   Lymphocytes Relative 46  12 - 46 %   Lymphs Abs  2.9  0.7 - 4.0 K/uL   Monocytes Relative 8  3 - 12 %   Monocytes Absolute 0.5  0.1 - 1.0 K/uL   Eosinophils Relative 1  0 - 5 %   Eosinophils Absolute 0.0  0.0 - 0.7 K/uL   Basophils Relative 0  0 - 1 %   Basophils Absolute 0.0  0.0 - 0.1 K/uL  COMPREHENSIVE METABOLIC PANEL     Status: Abnormal   Collection Time    12/29/12  2:32 AM      Result Value Range   Sodium 134 (*) 135 - 145 mEq/L   Potassium 3.7  3.5 - 5.1 mEq/L   Chloride 101  96 - 112 mEq/L   CO2 20  19 - 32 mEq/L   Glucose, Bld 111 (*) 70 - 99 mg/dL   BUN 6  6 - 23 mg/dL   Creatinine, Ser 4.54  0.50 - 1.10 mg/dL   Calcium 9.2  8.4 - 09.8 mg/dL   Total Protein 7.1  6.0 - 8.3 g/dL   Albumin 3.4 (*) 3.5 - 5.2 g/dL   AST 30  0 - 37 U/L   ALT 31  0 - 35 U/L   Alkaline Phosphatase 63  39 - 117 U/L   Total Bilirubin 0.2 (*) 0.3 - 1.2 mg/dL   GFR calc non Af Amer >90  >90  mL/min   GFR calc Af Amer >90  >90 mL/min  URINALYSIS, ROUTINE W REFLEX MICROSCOPIC     Status: None   Collection Time    12/29/12  3:43 AM      Result Value Range   Color, Urine YELLOW  YELLOW   APPearance CLEAR  CLEAR   Specific Gravity, Urine 1.027  1.005 - 1.030   pH 5.5  5.0 - 8.0   Glucose, UA NEGATIVE  NEGATIVE mg/dL   Hgb urine dipstick NEGATIVE  NEGATIVE   Bilirubin Urine NEGATIVE  NEGATIVE   Ketones, ur NEGATIVE  NEGATIVE mg/dL   Protein, ur NEGATIVE  NEGATIVE mg/dL   Urobilinogen, UA 0.2  0.0 - 1.0 mg/dL   Nitrite NEGATIVE  NEGATIVE   Leukocytes, UA NEGATIVE  NEGATIVE  POCT PREGNANCY, URINE     Status: Abnormal   Collection Time    12/29/12  3:50 AM      Result Value Range   Preg Test, Ur POSITIVE (*) NEGATIVE  WET PREP, GENITAL     Status: Abnormal   Collection Time    12/29/12  4:48 AM      Result Value Range   Yeast Wet Prep HPF POC NONE SEEN  NONE SEEN   Trich, Wet Prep NONE SEEN  NONE SEEN   Clue Cells Wet Prep HPF POC NONE SEEN  NONE SEEN   WBC, Wet Prep HPF POC FEW (*) NONE SEEN  ABO/RH     Status: None   Collection Time    12/29/12  5:20 AM      Result Value Range   ABO/RH(D) O POS     No rh immune globuloin NOT A RH IMMUNE GLOBULIN CANDIDATE, PT RH POSITIVE    HCG, QUANTITATIVE, PREGNANCY     Status: Abnormal   Collection Time    12/29/12  5:20 AM      Result Value Range   hCG, Beta Chain, Quant, S 53141 (*) <5 mIU/mL    Repeat u/s shows no IUP or adnexal mass, endometrium thinner than u/s earlier this morning  Assessment and Plan   1.  Miscarriage   U/S and pt's history c/w SAB - will f/u quant in 48 hours to verify, precautions rev'd    Medication List    TAKE these medications       multivitamin with minerals tablet  Take 1 tablet by mouth daily.            Follow-up Information   Follow up with [facility] [place] [facility] On 12/31/2012. (for repeat labs)    Contact information:   [address] [id number] [place] [address] [phone]        [facility] 12/29/2012, 8:46 AM

## 2012-12-30 ENCOUNTER — Telehealth: Payer: Self-pay | Admitting: General Practice

## 2012-12-30 LAB — GC/CHLAMYDIA PROBE AMP
CT Probe RNA: NEGATIVE
GC Probe RNA: NEGATIVE

## 2012-12-30 NOTE — Telephone Encounter (Signed)
12/30/12 Patient stated she will call [facility].  P.Selassie Spatafore,RN

## 2012-12-30 NOTE — Telephone Encounter (Signed)
Requested [facility] to do labs and would like to know if we received results and who she can call to receive results.

## 2012-12-31 ENCOUNTER — Inpatient Hospital Stay (HOSPITAL_COMMUNITY)
Admission: AD | Admit: 2012-12-31 | Discharge: 2012-12-31 | Disposition: A | Discharge status: Home / Self Care | Payer: 59 | Source: Ambulatory Visit | Attending: Obstetrics & Gynecology | Admitting: Obstetrics & Gynecology

## 2012-12-31 ENCOUNTER — Encounter (HOSPITAL_COMMUNITY): Payer: Self-pay | Admitting: *Deleted

## 2012-12-31 DIAGNOSIS — O039 Complete or unspecified spontaneous abortion without complication: Secondary | ICD-10-CM | POA: Insufficient documentation

## 2012-12-31 NOTE — MAU Provider Note (Signed)
History     CSN: [id number]  Arrival date and time: 12/31/12 1006   None     Chief Complaint  Patient presents with  . Follow-up   HPI Laura Cross 27 y.o.  Coming to MAU today for a follow up quant from visit on 12-29-12 where diagnosis was probable miscarriage.  Client was unaware she was pregnant, but had a large amount of bleeding with clots.  No pain today.  Still having small amount of vaginal bleeding.  OB History   Grav Para Term Preterm Abortions TAB SAB Ect Mult Living   1 0 0 0 0 0 0 0 0 0       Past Medical History  Diagnosis Date  . Yeast infection   . Bacterial infection   . Chlamydia   . Gonorrhea   . HPV (human papilloma virus) infection   . Herpes   . Syphilis   . Ovarian cyst     No past surgical history on file.  Family History  Problem Relation Age of Onset  . Heart disease Father   . Hypertension Maternal Grandmother   . Diabetes Maternal Grandmother   . Stroke Maternal Grandmother   . Hypertension Maternal Grandfather   . GER disease Mother     History  Substance Use Topics  . Smoking status: Never Smoker   . Smokeless tobacco: Never Used  . Alcohol Use: Yes    Allergies:  Allergies  Allergen Reactions  . Meloxicam Other (See Comments)    Bleeding  . Nuvaring (Etonogestrel-Ethinyl Estradiol)     Flu-like symptoms    Prescriptions prior to admission  Medication Sig Dispense Refill  . acetaminophen (TYLENOL) 500 MG tablet Take 1,000 mg by mouth every 4 (four) hours as needed for pain.      . Multiple Vitamins-Minerals (MULTIVITAMIN WITH MINERALS) tablet Take 1 tablet by mouth daily.        Review of Systems  Constitutional: Negative for fever.  Gastrointestinal: Negative for nausea, vomiting and abdominal pain.  Genitourinary: Negative for dysuria.       Vaginal bleeding   Physical Exam   Blood pressure 133/75, pulse 79, temperature 98.2 F (36.8 C), temperature source Oral, resp. rate 18, last menstrual period  11/28/2012.  Physical Exam  Nursing note and vitals reviewed. Constitutional: She is oriented to person, place, and time. She appears well-developed and well-nourished.  HENT:  Head: Normocephalic.  Eyes: EOM are normal.  Neck: Neck supple.  Musculoskeletal: Normal range of motion.  Neurological: She is alert and oriented to person, place, and time.  Skin: Skin is warm and dry.  Psychiatric: She has a normal mood and affect.    MAU Course  Procedures Results for LUCY, BOARDMAN (MRN [id number]) as of 12/31/2012 11:23  Ref. Range 12/29/2012 05:20 12/29/2012 08:15 12/29/2012 09:49 12/31/2012 05:40 12/31/2012 10:39  hCG, Beta Chain, Quant, S Latest Range: <5 mIU/mL 53141 (H)    9882 (H)   MDM Consult with Dr. Debroah Loop  - Significant drop in BHCG - likely a miscarriage, not ectopic pregnancy.  Follow up in clinic this week but client has an appointment on Wednesday with [facility] and will follow up there.  Instructions given to tell her provider about being seen here with diagnosis of miscarriage and have them obtain her records.  Assessment and Plan  Spontaneous miscarriage  Plan Follow up with [facility] on Wednesday as scheduled. No intercourse until seen by the medical provider there. Return sooner with  worsening symptoms or fever.  Keyan Folson 12/31/2012, 11:33 AM

## 2012-12-31 NOTE — MAU Note (Signed)
Pt here for follow up BHCG. Stated still having some bleeding and cramping but less than on Thursday.

## 2013-01-01 ENCOUNTER — Telehealth: Payer: Self-pay | Admitting: Medical

## 2013-01-01 NOTE — Telephone Encounter (Signed)
Patient called MAU requesting pain medication. Patient states that she had recent SAB. Notes in Epic confirm. Patient states that she was told to take OTC pain medications. She has been taking 400 mg Aleve PRN without significant relief. I have instructed the patient that she can take up to 800 mg of a ibuprofen q 8 hours PRN for pain. If this is not significant for pain management then she will need to return to MAU for further evaluation as stronger pain medications would need to be prescribed in person. The patient voices understanding.   Freddi Starr, PA-C 01/01/2013 10:47 AM

## 2013-01-05 ENCOUNTER — Encounter: Payer: Self-pay | Admitting: Gynecology

## 2013-01-11 ENCOUNTER — Encounter: Payer: Self-pay | Admitting: Family Medicine

## 2013-01-25 ENCOUNTER — Ambulatory Visit: Payer: 59

## 2014-06-07 ENCOUNTER — Ambulatory Visit (INDEPENDENT_AMBULATORY_CARE_PROVIDER_SITE_OTHER): Payer: 59 | Admitting: Family Medicine

## 2014-06-07 VITALS — BP 140/90 | HR 84 | Temp 98.9°F | Resp 16 | Ht 61.0 in | Wt 195.6 lb

## 2014-06-07 DIAGNOSIS — R7309 Other abnormal glucose: Secondary | ICD-10-CM

## 2014-06-07 DIAGNOSIS — E669 Obesity, unspecified: Secondary | ICD-10-CM

## 2014-06-07 DIAGNOSIS — R7303 Prediabetes: Secondary | ICD-10-CM

## 2014-06-07 DIAGNOSIS — E785 Hyperlipidemia, unspecified: Secondary | ICD-10-CM

## 2014-06-07 DIAGNOSIS — N926 Irregular menstruation, unspecified: Secondary | ICD-10-CM

## 2014-06-07 DIAGNOSIS — N62 Hypertrophy of breast: Secondary | ICD-10-CM

## 2014-06-07 LAB — POCT URINE PREGNANCY: PREG TEST UR: NEGATIVE

## 2014-06-07 MED ORDER — METFORMIN HCL 500 MG PO TABS: 500.0000 mg | ORAL_TABLET | Freq: Two times a day (BID) | ORAL | Status: DC

## 2014-06-07 NOTE — Patient Instructions (Signed)
We are going to start you on metformin for PCOS and weight management.  Start with one tablet at bedtime and increase to one twice a day after about one week. Work on getting exercise to the point of sweating at least 4-5 days per week.  We will refer you to nutrition counseling.  In the meantime work towards a diet of 1600-1800 calories a day for gradual weight loss of 1-2 lbs per week   I will also set you up to see plastic surgery so you can discuss a breast reduction   Don't forget that metformin can increase your fertility.  If you do decide to have intercourse be SURE to use a condom  Please see me in about 6 weeks for a recheck

## 2014-06-07 NOTE — Progress Notes (Signed)
[facility] [address], [place] [address] [phone]  Date:  06/07/2014   Name:  Laura Cross   DOB:  Oct 10, 1985   MRN:  [id number]  PCP:  No PCP Per Patient    Chief Complaint: Weight Loss, Hyperlipidemia and Elevated A1C   History of Present Illness:  Laura Cross is a 28 y.o. very pleasant female patient who presents with the following:  Here today with a few issues.  She had a health screening at her job a couple of months ago.  She was concerned because some of her labs are high. Also, she is interested in weight loss.   She brought her lab results with her as follows:  A1c was 6.2% CHL 295 Tri 295 HDL 39 LDL 197 Renal function ok  She is trying to exercise more to help manage her health.  She likes to go to the gym and do cardio but this can be hard for her.  She tends to have back pain which may be due to her large breasts and limits her ability to exercise.   She notes some cutting in from her bra straps.    She is trying to work on her diet.   She has made some changes in her diet and exercise program since her labs were drawn  There is a family history of diabetes and HTN.   LMP - she tends to have irregular bleeding.  She did have a miscarriage last year.   She is not on contraception right now.  Reports she is not regularly sexually active and does not want to be on contraceptions currently  Wt Readings from Last 3 Encounters:  06/07/14 195 lb 9.6 oz (88.724 kg)  12/21/12 187 lb (84.823 kg)  12/03/12 187 lb (84.823 kg)     Patient Active Problem List   Diagnosis Date Noted  . HPV (human papilloma virus) infection   . Herpes   . Ovarian cyst     Past Medical History  Diagnosis Date  . Yeast infection   . Bacterial infection   . Chlamydia   . Gonorrhea   . HPV (human papilloma virus) infection   . Herpes   . Syphilis   . Ovarian cyst     History reviewed. No pertinent past surgical history.  History   Substance Use Topics  . Smoking status: Never Smoker   . Smokeless tobacco: Never Used  . Alcohol Use: Yes    Family History  Problem Relation Age of Onset  . Heart disease Father   . Hypertension Maternal Grandmother   . Diabetes Maternal Grandmother   . Stroke Maternal Grandmother   . Heart disease Maternal Grandmother   . Hypertension Maternal Grandfather   . GER disease Mother     Allergies  Allergen Reactions  . Meloxicam Other (See Comments)    Bleeding  . Nuvaring [Etonogestrel-Ethinyl Estradiol]     Flu-like symptoms    Medication list has been reviewed and updated.  Current Outpatient Prescriptions on File Prior to Visit  Medication Sig Dispense Refill  . acetaminophen (TYLENOL) 500 MG tablet Take 1,000 mg by mouth every 4 (four) hours as needed for pain.      . Multiple Vitamins-Minerals (MULTIVITAMIN WITH MINERALS) tablet Take 1 tablet by mouth daily.       No current facility-administered medications on file prior to visit.    Review of Systems:  As per HPI- otherwise negative.   Physical  Examination: Filed Vitals:   06/07/14 1054  BP: 140/90  Pulse: 84  Temp: 98.9 F (37.2 C)  Resp: 16   Filed Vitals:   06/07/14 1054  Height: 5\' 1"  (1.549 m)  Weight: 195 lb 9.6 oz (88.724 kg)   Body mass index is 36.98 kg/(m^2). Ideal Body Weight: Weight in (lb) to have BMI = 25: 132  GEN: WDWN, NAD, Non-toxic, A & O x 3, obese, looks well HEENT: Atraumatic, Normocephalic. Neck supple. No masses, No LAD. Ears and Nose: No external deformity. CV: RRR, No M/G/R. No JVD. No thrill. No extra heart sounds. PULM: CTA B, no wheezes, crackles, rhonchi. No retractions. No resp. distress. No accessory muscle use. EXTR: No c/c/e NEURO Normal gait.  PSYCH: Normally interactive. Conversant. Not depressed or anxious appearing.  Calm demeanor.  Large breasts Results for orders placed in visit on 06/07/14  POCT URINE PREGNANCY      Result Value Ref Range   Preg  Test, Ur Negative      Assessment and Plan: Pre-diabetes - Plan: Ambulatory referral to diabetic education  Irregular menses - Plan: POCT urine pregnancy, metFORMIN (GLUCOPHAGE) 500 MG tablet  Macromastia - Plan: Ambulatory referral to Plastic Surgery  Obesity - Plan: Ambulatory referral to diabetic education  Dyslipidemia  Laura Cross is here today with concern regarding obesity, pre- diabetes, dyslipidemia.   Went over a reasonable diet and exercise program for her.  Will start on metformin for likely PCOS, cautioned about increased fertility with this medication Referral to nutrition and plastics Plan recheck in 6 weeks See patient instructions for more details.     Signed Lamar Blinks, MD

## 2014-06-13 ENCOUNTER — Telehealth: Payer: Self-pay | Admitting: Family Medicine

## 2014-06-13 ENCOUNTER — Other Ambulatory Visit: Payer: Self-pay

## 2014-06-13 DIAGNOSIS — N926 Irregular menstruation, unspecified: Secondary | ICD-10-CM

## 2014-06-13 MED ORDER — METFORMIN HCL 500 MG PO TABS: 500.0000 mg | ORAL_TABLET | Freq: Two times a day (BID) | ORAL | Status: DC

## 2014-06-13 NOTE — Telephone Encounter (Signed)
Pt states that the bleeding has resolved. She has been using ibuprofen and aleve together for her cramping. Advised pt to not take these medications together.  Advised pt to try warm compress, ibuprofen OR Aleve. Suggested pt could also try Midol with the caffeine but not use these medications together.  Pt understands RTC precautions.

## 2014-06-13 NOTE — Telephone Encounter (Signed)
Please advise.   Information I have read is that Metformin can improve menstrual symptoms.

## 2014-06-13 NOTE — Telephone Encounter (Signed)
When used for PCOS, women who have been amenorrheic or had very irregular periods, often begin to have heavier, more regular periods when they begin metformin. But, she just started it at the visit 06/07/2014, so it's unlikely that the metformin is causing her symptoms.  If her heavy bleeding persists, I recommend evaluation.

## 2014-06-13 NOTE — Telephone Encounter (Signed)
Patient states that the Metformin is causing her to have extreme pain and heavy menstrual bleeding. Please advise  [phone]

## 2014-07-27 ENCOUNTER — Ambulatory Visit: Payer: 59 | Admitting: *Deleted

## 2014-08-21 ENCOUNTER — Telehealth: Payer: Self-pay

## 2014-08-21 NOTE — Telephone Encounter (Signed)
Spoke to pt, advised her to take as directed.  Metformin is causing diarrhea, is there anything she can use in conjunction with this to eliminate this issue.   Please advise

## 2014-08-21 NOTE — Telephone Encounter (Signed)
Pt states she was put on 1000 mgs of metformin, to take 1 at lunch and 1 at night, would like to know if she could take them both at night Please call [phone]

## 2014-08-22 NOTE — Telephone Encounter (Signed)
Spoke to pt, she is aware of instructions.

## 2014-08-22 NOTE — Telephone Encounter (Signed)
She currently should be taking 500 mg twice daily with meals.  Taking both tablets at once will likely increase her symptoms.  I suggest that she try taking 1/2 tablet twice each day for several weeks. If the diarrhea resolves, she can try the higher dose again.  If her symptoms persist, would recommend changing to the XR formulation.

## 2014-08-30 ENCOUNTER — Ambulatory Visit: Payer: 59 | Admitting: *Deleted

## 2014-09-17 ENCOUNTER — Encounter: Payer: Self-pay | Admitting: *Deleted

## 2014-09-17 ENCOUNTER — Encounter: Payer: 59 | Attending: Family Medicine | Admitting: *Deleted

## 2014-09-17 DIAGNOSIS — E785 Hyperlipidemia, unspecified: Secondary | ICD-10-CM | POA: Diagnosis not present

## 2014-09-17 DIAGNOSIS — R7309 Other abnormal glucose: Secondary | ICD-10-CM | POA: Insufficient documentation

## 2014-09-17 DIAGNOSIS — Z713 Dietary counseling and surveillance: Secondary | ICD-10-CM | POA: Insufficient documentation

## 2014-09-17 DIAGNOSIS — R7303 Prediabetes: Secondary | ICD-10-CM

## 2014-09-17 NOTE — Progress Notes (Signed)
  Medical Nutrition Therapy:  Appt start time: 0800 end time:  0900.  Assessment:  Primary concerns today: Laura Cross is here for nutrition counseling pertaining to desire to lose weight and diagnosis of prediabetes.  She wants to lose about 20-30 pounds.  [name] had a biometric health screening at work in August and her A1c was 6.2%, her total cholesterol was 295, triglycerides were 295 and HDL was 39 mg/dl. Laura Cross lives alone and does her own grocery shopping and cooking.  She states she tries to bake and grill, and sometime she might fry.  She eats out maybe once a week: [employer] or [employer].  She typically eats in the living room while watching tv or on her phone.  She thinks she is a slow eater and it takes 40 minutes to finish a meal.  Sometimes she feels like she over eats.  Last year she lost 40-50 pounds through (excessive) exercise and food changes (restricted to 1200 calories/day) but it wasn't sustainable so she gained it all back and then some.    Preferred Learning Style:   No preference indicated   Learning Readiness:   Ready   MEDICATIONS: metformin 750 mg   DIETARY INTAKE:  Suggested 1800 calories by Dr. Edilia Bo  Usual eating pattern includes 3-4 meals and 2 snacks per day.  Everyday foods include proteins, some carbohdrates.  Avoided foods include none, but tries to limit breads and pasta.  .    24-hr recall:  B ( AM): yogurt with granola  Snk ( AM): oatmeal with cinnamon and brown sugar  L ( PM): might go out or bring something from home: salad and pork chop Snk ( PM): yogurt or chips D ( PM): cereal sometimes.  Might cook chicken.  Skips a lot Snk ( PM): not usually Beverages: water sometimes with sugar-free mixed in.  Soda rarely. Juice some, no tea, lemonade sometimes, sometimes smoothies  Usual physical activity: goes to gym 3-4 times/week: Zumba for an hour or other classes   Estimated energy needs: 1800 calories 200 g carbohydrates 135 g protein 50 g  fat   Nutritional Diagnosis:  NB-1.1 Food and nutrition-related knowledge deficit As related to proper balance of fats, carbohydrates, and proteins.  As evidenced by HbA1c of 6.2% and dyslipidemia.    Intervention:  Nutrition counseling provided.  Discussed metabolic effects of meal skipping/severe calorie restriction and discouraged this practice.  Discussed MyPlate recommendations for meal planning: 1/4 plate carbohydrates, 1/2 plate vegetables, 1/4 plate protein.  Discussed more mindful eating practices of eating without distractions and stopping when comfortably full, not stuffed.    Goals: Aim for 3 meals day/ avoid meal skipping.   Plan ahead with meals and snacks Aim to eat about every 3-5 hours (if you're hungry) Honor those internal hunger and fullness cues Eat without distractions (no tv and no phone) Stop eating when comfortable, not stuffed Follow MyPlate recommendations for meal planning Say goodbye to diets!  Teaching Method Utilized:  Visual Auditory Hands on  Handouts given during visit include:  MyPlate for diabetes  My meal plan card  15 g carb snack handout  Barriers to learning/adherence to lifestyle change: busy schedule  Demonstrated degree of understanding via:  Teach Back   Monitoring/Evaluation:  Dietary intake, exercise, lab results, and body weight prn.

## 2014-09-17 NOTE — Patient Instructions (Signed)
Aim for 3 meals day/ avoid meal skipping.   Plan ahead with meals and snacks Aim to eat about every 3-5 hours (if you're hungry) Honor those internal hunger and fullness cues Eat without distractions (no tv and no phone) Stop eating when comfortable, not stuffed Follow MyPlate recommendations for meal planning Say goodbye to diets!

## 2014-12-23 ENCOUNTER — Ambulatory Visit (INDEPENDENT_AMBULATORY_CARE_PROVIDER_SITE_OTHER): Payer: 59 | Admitting: Physician Assistant

## 2014-12-23 VITALS — BP 118/76 | HR 89 | Temp 99.0°F | Resp 20 | Ht 61.5 in | Wt 193.0 lb

## 2014-12-23 DIAGNOSIS — J069 Acute upper respiratory infection, unspecified: Secondary | ICD-10-CM

## 2014-12-23 DIAGNOSIS — B9789 Other viral agents as the cause of diseases classified elsewhere: Principal | ICD-10-CM

## 2014-12-23 MED ORDER — HYDROCODONE-HOMATROPINE 5-1.5 MG/5ML PO SYRP: 5.0000 mL | ORAL_SOLUTION | Freq: Three times a day (TID) | ORAL | Status: DC | PRN

## 2014-12-23 MED ORDER — NAPROXEN 500 MG PO TABS: 500.0000 mg | ORAL_TABLET | Freq: Two times a day (BID) | ORAL | Status: DC

## 2014-12-23 MED ORDER — LORATADINE-PSEUDOEPHEDRINE ER 5-120 MG PO TB12: 1.0000 | ORAL_TABLET | Freq: Two times a day (BID) | ORAL | Status: DC

## 2014-12-23 NOTE — Progress Notes (Signed)
12/23/2014 at 11:45 AM  Stormy Card / DOB: 1985-12-13 / MRN: [id number]  The patient has HPV (human papilloma virus) infection; Herpes; and Ovarian cyst on Laura Cross problem list.  SUBJECTIVE  Chief complaint: Sore Throat; Chest Pain; and Nasal Congestion  Laura Cross is a 29 y.o. female complaining of sore throat that started 4 days ago.  Associated symptoms include runny nose, congestion and cough today, and Laura Cross denies fever, difficulty breathing and headache.The patient symptoms are worsening. Treatments tried thus far include acetaminophen, cough suppressant of choice with fair  relief. Laura Cross denies sick contacts. Laura Cross denies a history of asthma. Laura Cross reports Laura Cross had Laura Cross period roughly 25 days ago and has not had intercourse since that time.    Laura Cross  has a past medical history of Yeast infection; Bacterial infection; Chlamydia; Gonorrhea; HPV (human papilloma virus) infection; Herpes; Syphilis; and Ovarian cyst.    Medications reviewed and updated by myself where necessary, and exist elsewhere in the encounter.   Laura Cross is allergic to meloxicam and nuvaring. Laura Cross  reports that Laura Cross has never smoked. Laura Cross has never used smokeless tobacco. Laura Cross reports that Laura Cross drinks alcohol. Laura Cross reports that Laura Cross does not use illicit drugs. Laura Cross  has no sexual activity history on file. The patient  has no past surgical history on file.  Laura Cross family history includes Diabetes in Laura Cross maternal grandmother; GER disease in Laura Cross mother; Heart disease in Laura Cross father and maternal grandmother; Hypertension in Laura Cross maternal grandfather and maternal grandmother; Stroke in Laura Cross maternal grandmother.  Review of Systems  Respiratory: Negative for wheezing.   Cardiovascular: Negative for chest pain.  Gastrointestinal: Negative for nausea, vomiting and abdominal pain.    OBJECTIVE  Laura Cross  height is 5' 1.5" (1.562 m) and weight is 193 lb (87.544 kg). Laura Cross oral temperature is 99 F (37.2 C). Laura Cross blood pressure is 118/76 and Laura Cross  pulse is 89. Laura Cross respiration is 20 and oxygen saturation is 98%.  The patient's body mass index is 35.88 kg/(m^2).  Physical Exam  Constitutional: Laura Cross is oriented to person, place, and time. Laura Cross appears well-developed and well-nourished. No distress.  HENT:  Right Ear: Hearing, tympanic membrane, external ear and ear canal normal.  Left Ear: Hearing, tympanic membrane, external ear and ear canal normal.  Nose: Mucosal edema present. Right sinus exhibits no maxillary sinus tenderness and no frontal sinus tenderness. Left sinus exhibits no maxillary sinus tenderness and no frontal sinus tenderness.  Mouth/Throat: Uvula is midline, oropharynx is clear and moist and mucous membranes are normal.  Cardiovascular: Normal rate, regular rhythm and normal heart sounds.   Respiratory: Effort normal and breath sounds normal. Laura Cross has no wheezes. Laura Cross has no rales.  Neurological: Laura Cross is alert and oriented to person, place, and time.  Skin: Skin is warm and dry. Laura Cross is not diaphoretic.  Psychiatric: Laura Cross has a normal mood and affect.    No results found for this or any previous visit (from the past 24 hour(s)).  ASSESSMENT & PLAN  Chestine was seen today for sore throat, chest pain and nasal congestion.  Diagnoses and all orders for this visit:  Viral URI with cough Orders: -     naproxen (NAPROSYN) 500 MG tablet; Take 1 tablet (500 mg total) by mouth 2 (two) times daily with a meal. -     HYDROcodone-homatropine (HYCODAN) 5-1.5 MG/5ML syrup; Take 5 mLs by mouth every 8 (eight) hours as needed for cough. -     loratadine-pseudoephedrine (CLARITIN-D 12 HOUR)  5-120 MG per tablet; Take 1 tablet by mouth 2 (two) times daily.   The patient was advised to call or come back to clinic if Laura Cross does not see an improvement in symptoms, or worsens with the above plan.   Philis Fendt, MHS, PA-C [facility] 12/23/2014 11:45 AM

## 2015-06-05 ENCOUNTER — Ambulatory Visit (INDEPENDENT_AMBULATORY_CARE_PROVIDER_SITE_OTHER): Payer: 59 | Admitting: Medical

## 2015-06-05 ENCOUNTER — Telehealth: Payer: Self-pay

## 2015-06-05 ENCOUNTER — Encounter: Payer: Self-pay | Admitting: Medical

## 2015-06-05 VITALS — BP 110/88 | HR 80 | Temp 97.8°F | Ht 60.5 in | Wt 186.0 lb

## 2015-06-05 DIAGNOSIS — Z8249 Family history of ischemic heart disease and other diseases of the circulatory system: Secondary | ICD-10-CM | POA: Insufficient documentation

## 2015-06-05 DIAGNOSIS — E782 Mixed hyperlipidemia: Secondary | ICD-10-CM

## 2015-06-05 DIAGNOSIS — A6000 Herpesviral infection of urogenital system, unspecified: Secondary | ICD-10-CM

## 2015-06-05 DIAGNOSIS — Z23 Encounter for immunization: Secondary | ICD-10-CM

## 2015-06-05 DIAGNOSIS — R7301 Impaired fasting glucose: Secondary | ICD-10-CM | POA: Insufficient documentation

## 2015-06-05 DIAGNOSIS — Z113 Encounter for screening for infections with a predominantly sexual mode of transmission: Secondary | ICD-10-CM | POA: Insufficient documentation

## 2015-06-05 DIAGNOSIS — Z8619 Personal history of other infectious and parasitic diseases: Secondary | ICD-10-CM | POA: Diagnosis not present

## 2015-06-05 DIAGNOSIS — Z6836 Body mass index (BMI) 36.0-36.9, adult: Secondary | ICD-10-CM

## 2015-06-05 DIAGNOSIS — E669 Obesity, unspecified: Secondary | ICD-10-CM | POA: Diagnosis not present

## 2015-06-05 DIAGNOSIS — L989 Disorder of the skin and subcutaneous tissue, unspecified: Secondary | ICD-10-CM

## 2015-06-05 DIAGNOSIS — Z0000 Encounter for general adult medical examination without abnormal findings: Secondary | ICD-10-CM

## 2015-06-05 LAB — POCT URINALYSIS DIPSTICK
Bilirubin, UA: NEGATIVE
Blood, UA: NEGATIVE
Glucose, UA: NEGATIVE
Ketones, UA: NEGATIVE
LEUKOCYTES UA: NEGATIVE
NITRITE UA: NEGATIVE
PH UA: 6
PROTEIN UA: NEGATIVE
Spec Grav, UA: 1.025
UROBILINOGEN UA: NEGATIVE

## 2015-06-05 NOTE — Progress Notes (Signed)
Subjective:   HPI  Laura Cross is a 29 y.o. female who presents for a complete physical.  New patient today.   Referred by coworker that comes here for care.     Concerns: Needs biometric screen completed, has recent 03/2015 cholesterol labs to review.    Wants help with weight loss.  Is exercising, eating healthy, but no seeing weight loss.     Reviewed their medical, surgical, family, social, medication, and allergy history and updated chart as appropriate.  Past Medical History  Diagnosis Date  . Yeast infection   . Bacterial infection   . Herpes     genital  . Syphilis 2010  . Ovarian cyst   . Sexually transmitted disease (STD)     hx/o HPV, gonorrhea, chlamydia, genital herpes  . Miscarriage 2014  . Family history of premature CAD     father  . Mixed dyslipidemia     Past Surgical History  Procedure Laterality Date  . No past surgeries  05/2015    Social History   Social History  . Marital Status: Single    Spouse Name: N/A  . Number of Children: N/A  . Years of Education: N/A   Occupational History  . Not on file.   Social History Main Topics  . Smoking status: Never Smoker   . Smokeless tobacco: Never Used  . Alcohol Use: 0.0 oz/week    0 Standard drinks or equivalent per week     Comment: occasionally  . Drug Use: No  . Sexual Activity: Not on file   Other Topics Concern  . Not on file   Social History Narrative   Lives alone. No children.  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   Exercise - 3 days per week of aerobic, zumba, some weight lifting.  No significant other.  05/2015    Family History  Problem Relation Age of Onset  . Heart disease Father 22    died MI  . Hypertension Maternal Grandmother   . Diabetes Maternal Grandmother   . Stroke Maternal Grandmother   . Heart disease Maternal Grandmother   . Hypertension Maternal Grandfather   . COPD Maternal Grandfather   . GER disease Mother   . Deep vein thrombosis Mother    with MVA  . COPD Paternal Grandfather      Current outpatient prescriptions:  Marland Kitchen  Multiple Vitamins-Minerals (MULTIVITAMIN WITH MINERALS) tablet, Take 1 tablet by mouth daily., Disp: , Rfl:  .  acetaminophen (TYLENOL) 500 MG tablet, Take 1,000 mg by mouth every 4 (four) hours as needed for pain., Disp: , Rfl:  .  loratadine-pseudoephedrine (CLARITIN-D 12 HOUR) 5-120 MG per tablet, Take 1 tablet by mouth 2 (two) times daily. (Patient not taking: Reported on 06/05/2015), Disp: 14 tablet, Rfl: 0  Allergies  Allergen Reactions  . Meloxicam Other (See Comments)    Bleeding?  Can tolerate other NSAIDs  . Metformin And Related     Diarrhea, hair falling out  . Nuvaring [Etonogestrel-Ethinyl Estradiol]     Flu-like symptoms    Review of Systems Constitutional: -fever, -chills, -sweats, -unexpected weight change, -decreased appetite, -fatigue Allergy: -sneezing, -itching, -congestion Dermatology: -changing moles, --rash, -lumps ENT: -runny nose, -ear pain, -sore throat, -hoarseness, -sinus pain, -teeth pain, - ringing in ears, -hearing loss, -nosebleeds Cardiology: -chest pain, -palpitations, -swelling, -difficulty breathing when lying flat, -waking up short of breath Respiratory: -cough, -shortness of breath, -difficulty breathing with exercise or exertion, -wheezing, -coughing up blood Gastroenterology: -abdominal pain, -  nausea, -vomiting, -diarrhea, -constipation, -blood in stool, -changes in bowel movement, -difficulty swallowing or eating Hematology: -bleeding, -bruising  Musculoskeletal: -joint aches, -muscle aches, -joint swelling, -back pain, -neck pain, -cramping, -changes in gait Ophthalmology: denies vision changes, eye redness, itching, discharge Urology: -burning with urination, -difficulty urinating, -blood in urine, -urinary frequency, -urgency, -incontinence Neurology: -headache, -weakness, -tingling, -numbness, -memory loss, -falls, -dizziness Psychology: -depressed mood,  -agitation, -sleep problems     Objective:   Physical Exam  BP 110/88 mmHg  Pulse 80  Temp(Src) 97.8 F (36.6 C) (Oral)  Ht 5' 0.5" (1.537 m)  Wt 186 lb (84.369 kg)  BMI 35.71 kg/m2  LMP 05/30/2015  General appearance: alert, no distress, WD/WN, AA female Skin: small scattered skin tags of bilat cheeks and left lower eyelid, no worrisome lesions HEENT: normocephalic, conjunctiva/corneas normal, sclerae anicteric, PERRLA, EOMi, nares patent, no discharge or erythema, pharynx normal Oral cavity: MMM, tongue normal, teeth in good repair Neck: supple, no lymphadenopathy, no thyromegaly, no masses, normal ROM, no bruits Chest: non tender, normal shape and expansion Heart: RRR, normal S1, S2, no murmurs Lungs: CTA bilaterally, no wheezes, rhonchi, or rales Abdomen: +bs, soft, non tender, non distended, no masses, no hepatomegaly, no splenomegaly, no bruits Back: non tender, normal ROM, no scoliosis Musculoskeletal: upper extremities non tender, no obvious deformity, normal ROM throughout, lower extremities non tender, no obvious deformity, normal ROM throughout Extremities: no edema, no cyanosis, no clubbing Pulses: 2+ symmetric, upper and lower extremities, normal cap refill Neurological: alert, oriented x 3, CN2-12 intact, strength normal upper extremities and lower extremities, sensation normal throughout, DTRs 2+ throughout, no cerebellar signs, gait normal Psychiatric: normal affect, behavior normal, pleasant  Breast/gyn - deferred to later visit at her request    Assessment and Plan :    Encounter Diagnoses  Name Primary?  . Routine general medical examination at a health care facility Yes  . Impaired fasting blood sugar   . Obesity   . Mixed dyslipidemia   . Genital herpes   . History of syphilis   . Family history of premature CAD   . Need for TD vaccine   . Screen for STD (sexually transmitted disease)   . Skin lesion     Physical exam - discussed healthy  lifestyle, diet, exercise, preventative care, vaccinations, and addressed their concerns.  Handout given. See your eye doctor yearly for routine vision care. See your dentist yearly for routine dental care including hygiene visits twice yearly. Return at her convenience for pap smear Discusses safe sex, condom use.  She is abstinent currently and does not wish to pursue birth control at this time.   No desire for acute therapy for genital herpes flare up to have on hand.  She has only had 1 prior genital herpes outbreak.  Has hx/o treatment for various STD including syphilis.   Discussed her cardiac risks factors, premature CAD ,mixed dyslipidemia  Reviewed the recent labs she had done showing high total and LDL cholesterol, low HDL.  She will use lifestyle measures currently to try and improve weight and lipids.  Pending labs, consider short term phentermine which she has used prior.  Also advised getting trainer for a month or 2.  She has seen dietician a year ago when diagnosed with impaired glucose.  Counseled on the Td (tetanus, diptheria) vaccine.  Vaccine information sheet given. Td vaccine given after consent obtained. She declines influenza vaccine today. Follow-up pending labs

## 2015-06-05 NOTE — Telephone Encounter (Signed)
Called and left her a message to call me back to inform her about the appt made for her at [facility] on 07/01/15 at 2:30pm. [address] [place] [address] [phone]

## 2015-06-05 NOTE — Telephone Encounter (Signed)
Pt informed about appt.  

## 2015-06-12 ENCOUNTER — Encounter: Payer: Self-pay | Admitting: Medical

## 2015-06-27 ENCOUNTER — Telehealth: Payer: Self-pay

## 2015-06-27 NOTE — Telephone Encounter (Signed)
Pt called to ask if we had her results from [facility] said it was an hiv test and a few other things.

## 2015-06-27 NOTE — Telephone Encounter (Signed)
When I saw her I thought we reviewed labs she brought in for cholesterol and possibly HgbA1C.  Does she still have those lab results?  If not, we can call in or fax order for HgbA1C and she can have this done.

## 2015-06-27 NOTE — Telephone Encounter (Signed)
I reviewed the labs.  They were fine except 1 liver test mildly elevated.   I recommend healthy diet, routine exercise as we discussed last visit.  We should plan to repeat the liver tests in 2-3 months.   I be live she had other labs last visit I reviewed, but don't see those for cholesterol and diabetes screen.  Do we have them?

## 2015-06-27 NOTE — Telephone Encounter (Signed)
pls check but I don't see them

## 2015-06-27 NOTE — Telephone Encounter (Signed)
Look under media, its from 06/05/15 says misc lab

## 2015-06-27 NOTE — Telephone Encounter (Signed)
They threw out her sample so it will have to be started over I cant add it on

## 2015-06-27 NOTE — Telephone Encounter (Signed)
Spoke with pt she is calling [facility] about those tests. It doesn't look like a hemoglobin A1C was ordered but cbc, cmp, and tsh where. I dont know whats included in each test but she is calling to see about getting that faxed over to [name]

## 2015-06-27 NOTE — Telephone Encounter (Signed)
Called and left her a message to call me back, but the only other scanned lab type documents i see are on 06/07/14 and 12/21/12. Is it either of those?

## 2015-06-27 NOTE — Telephone Encounter (Signed)
pls call [facility] and add it unless she had a recent one done.

## 2015-06-28 NOTE — Telephone Encounter (Signed)
Spoke with pt she is faxing results over

## 2015-07-11 ENCOUNTER — Telehealth: Payer: Self-pay

## 2015-07-11 NOTE — Telephone Encounter (Signed)
Asked me to find her paper chart and I can not locate one, sending you the message per your request. 3 other pts you had on the list but you said hers first

## 2015-07-29 ENCOUNTER — Telehealth: Payer: Self-pay

## 2015-07-29 NOTE — Telephone Encounter (Signed)
I do see the results now.   Her cholesterol numbers are definately high.   As we probably discussed at her October physical, the major recommendations would be healthy low fat/low cholesterol diet, weight loss, and regular exercise.  certainly eating low cholesterol foods would be the recommendations.   So do include in diet fish, avocado, almonds, nuts, olive oil vinaigrette in salads, olive oil if sauteing food, whole grains, and green leafy vegetables.   In general avoid red meat, fast food, fried foods, and baked goods (donuts, sweets, cakes, etc. ).   I recommend we recheck labs in the next 2-3 months during a time period of diet and exercise changes.  If these cholesterol numbers persist this high, then medication would be recommended.    See if that answers her questions?

## 2015-07-29 NOTE — Telephone Encounter (Signed)
Pt wanted to know if you ever got her [facility] test results regarding cholesterol? Said she tried to fax it multiple times but it wasn't going through. Spoke with someone up front and they told her to try one more time then she heard nothing back.

## 2015-07-29 NOTE — Telephone Encounter (Signed)
Pt aware and it answered all question

## 2015-07-30 ENCOUNTER — Other Ambulatory Visit: Payer: 59 | Admitting: Medical

## 2015-08-28 ENCOUNTER — Other Ambulatory Visit (HOSPITAL_COMMUNITY)
Admission: RE | Admit: 2015-08-28 | Discharge: 2015-08-28 | Disposition: A | Discharge status: Home / Self Care | Payer: 59 | Source: Ambulatory Visit | Attending: Medical | Admitting: Medical

## 2015-08-28 ENCOUNTER — Encounter: Payer: Self-pay | Admitting: Medical

## 2015-08-28 ENCOUNTER — Ambulatory Visit (INDEPENDENT_AMBULATORY_CARE_PROVIDER_SITE_OTHER): Payer: 59 | Admitting: Medical

## 2015-08-28 VITALS — BP 100/60 | HR 74 | Wt 190.0 lb

## 2015-08-28 DIAGNOSIS — Z124 Encounter for screening for malignant neoplasm of cervix: Secondary | ICD-10-CM

## 2015-08-28 DIAGNOSIS — N898 Other specified noninflammatory disorders of vagina: Secondary | ICD-10-CM

## 2015-08-28 DIAGNOSIS — Z113 Encounter for screening for infections with a predominantly sexual mode of transmission: Secondary | ICD-10-CM

## 2015-08-28 DIAGNOSIS — Z01419 Encounter for gynecological examination (general) (routine) without abnormal findings: Secondary | ICD-10-CM | POA: Insufficient documentation

## 2015-08-28 NOTE — Progress Notes (Signed)
Subjective: Chief Complaint  Patient presents with  . Gynecologic Exam    pap only. having problems with lt pointer finger, says its hurting.    Here for pap as f/u from her physical in 05/2015.   She notes currently abstinent from sexual activity.  Not on birth control and no desire for birth control at the moment.   She has hx/o 1 prior pregnancy that ended in miscarriage.   LMP 08/03/15.  Periods are regular.  She does note some mild discharge.  Has hx/o BV, no concern for STD.  She just got back from vacation to [place].   No other aggravating or relieving factors. No other complaint.  Past Medical History  Diagnosis Date  . Yeast infection   . Bacterial infection   . Herpes     genital  . Syphilis 2010  . Ovarian cyst   . Sexually transmitted disease (STD)     hx/o HPV, gonorrhea, chlamydia, genital herpes  . Miscarriage 2014  . Family history of premature CAD     father  . Mixed dyslipidemia    ROS as in subjective  Objective: BP 100/60 mmHg  Pulse 74  Wt 190 lb (86.183 kg)  LMP 08/03/2015  Breastfeeding? No  Gen: wd, wn, nad Breast: non tender,, denser tissue bilat upper lateral quadrant of breasts, otherwise no masses or lumps, no skin changes, no nipple discharge or inversion, no axillary lymphadenopathy Gyn: Normal external genitalia without lesions, vagina with normal mucosa, cervix without lesions, no cervical motion tenderness, no abnormal vaginal discharge.  Uterus and adnexa not enlarged, non tender, no masses.  Pap performed.  Exam chaperoned by nurse. Rectal: deferred    Assessment: Encounter Diagnoses  Name Primary?  . Vaginal discharge Yes  . Screening for cervical cancer   . Screen for STD (sexually transmitted disease)     Plan: Advised unscented soap such as Dove for routine hygiene.  Avoid sweets.   Labs sent.   F/u pending labs   [facility] was [facility] today for [facility]necologic exam.  Diagnoses and all orders for this visit:  Vaginal discharge -      Cytology - PAP [facility])  Screening for cervical cancer -     Cytology - PAP [facility])  Screen for STD (sexually transmitted disease) -     Cytology - PAP [place] [facility]

## 2015-08-30 LAB — CYTOLOGY - PAP

## 2015-09-02 ENCOUNTER — Telehealth: Payer: Self-pay

## 2015-09-02 DIAGNOSIS — N62 Hypertrophy of breast: Secondary | ICD-10-CM

## 2015-09-02 NOTE — Telephone Encounter (Signed)
Done called to let her know

## 2015-09-02 NOTE — Telephone Encounter (Signed)
Patient called to request a new plastic surgery referral for macromastia.  She said she had a referral placed in 2015 by Dr Lorelei Pont but did not follow through with an appointment.  She would now like to go for a consultation.  Please advise, thank you.  CB#: [phone]

## 2015-09-02 NOTE — Telephone Encounter (Signed)
Can we place another referral?

## 2015-10-23 ENCOUNTER — Ambulatory Visit (INDEPENDENT_AMBULATORY_CARE_PROVIDER_SITE_OTHER): Payer: 59 | Admitting: Medical

## 2015-10-23 ENCOUNTER — Encounter: Payer: Self-pay | Admitting: Medical

## 2015-10-23 VITALS — BP 120/78 | HR 83 | Wt 192.0 lb

## 2015-10-23 DIAGNOSIS — Z8249 Family history of ischemic heart disease and other diseases of the circulatory system: Secondary | ICD-10-CM | POA: Diagnosis not present

## 2015-10-23 DIAGNOSIS — E782 Mixed hyperlipidemia: Secondary | ICD-10-CM

## 2015-10-23 DIAGNOSIS — E669 Obesity, unspecified: Secondary | ICD-10-CM | POA: Diagnosis not present

## 2015-10-23 DIAGNOSIS — R7301 Impaired fasting glucose: Secondary | ICD-10-CM

## 2015-10-23 MED ORDER — PHENTERMINE-TOPIRAMATE ER 7.5-46 MG PO CP24: 1.0000 | ORAL_CAPSULE | ORAL | Status: DC

## 2015-10-23 MED ORDER — PHENTERMINE-TOPIRAMATE ER 3.75-23 MG PO CP24: 1.0000 | ORAL_CAPSULE | ORAL | Status: DC

## 2015-10-23 NOTE — Progress Notes (Signed)
   Subjective: Chief Complaint  Patient presents with  . Advice Only    discuss weight loss medication, has been trying on her own and nothing is helping her   Here for concerns for obesity, weight loss efforts.   She has hx/o impaired glucose, mixed dyslipidemia, family hx/o premature CAD.    Current diet: in general, a "healthy" diet  , uses My fItness Pal sometimes to track calories, has cut out red meat and avoids fried food, sweets, drinks no soda or sweet tea but does drink some juice Current exercise: walking and weightlifting Current medications to assist weight loss efforts: reduced calorie, around 1200 cal/day  No prior weight watchers, but has used Adipex in the past with improvements.  Saw dietician 1.5 years ago, has incorporated efforts they recommended but not seeing improvements.  Tries to be careful with portion sizes.  Eats 3 meals daily, drinks a good amount of water ,gets good sleep.    Wt Readings from Last 3 Encounters:  10/23/15 192 lb (87.091 kg)  08/28/15 190 lb (86.183 kg)  06/05/15 186 lb (84.369 kg)   The following portions of the patient's history were reviewed and updated as appropriate: allergies, current medications, past family history, past medical history, past social history, past surgical history and problem list.  ROS Otherwise as in subjective above   Objective: BP 120/78 mmHg  Pulse 83  Wt 192 lb (87.091 kg)  LMP 10/04/2015  General appearance: alert, no distress, AA  female Neck: supple, no lymphadenopathy, no thyromegaly, no masses Heart: RRR, normal S1, S2, no murmurs Lungs: CTA bilaterally, no wheezes, rhonchi, or rales Pulses: 2+ symmetric, upper and lower extremities, normal cap refill Ext: no edema   Assessment: Encounter Diagnoses  Name Primary?  . Obesity Yes  . Mixed dyslipidemia   . Family history of premature CAD   . Impaired fasting blood sugar      Plan: Obesity: BMI: II = 35-39.9 Obesity associated conditions:  hyperlipidemia and impaired glucse Reviewed 05/2015 labs   Treatment recommendations:  General weight loss/lifestyle modification strategies discussed (elicit support from others; identify saboteurs; non-food rewards, etc).  Behavioral treatment: Advised stress reduction where possible, consider counseling  Diet interventions: 1500 cal/day.  Informal exercise measures discussed, e.g. taking stairs instead of elevator, parking further away.  Regular aerobic exercise program discussed, including considering trainer  Discussed goal setting.  Medication: begin Qsymia.  Discussed risks/benefits of medication.  Patient voiced understanding of diagnosis, recommendations, and treatment plan.  After visit summary given.   Spent > 30 minutes face to face with patient in discussion of symptoms, evaluation, plan and recommendations.    Return in about 2 months (around 12/23/2015).

## 2015-10-23 NOTE — Patient Instructions (Signed)
  Thank you for giving me the opportunity to serve you today.    Your diagnosis today includes: No diagnosis found.   Specific recommendations today include: Diet  Increase your water intake, get at least 64 ounces of water daily  Eat 3-4 fruits daily  Eat plenty of vegetables throughout the day, preferably each meal  Eat good sources of grains such as oatmeal, barley, whole grain pasta, whole grain bread, but limit the serving size to 1 cup of oatmeal or pasta per meal or 2 slices of bread per meal  We don't need to meat at each meal, however if you do eat meat, limit serving size to the size of your palm, and eat chicken fish or [place], lean cuts of meat  Eat beans every day as this is a good nutrient source and helps to curb appetite  Consider using a program such as Weight Watchers  Consider using a Smart phone app such as My Fitness PAL or [employer] to track your calories and progress   Things to limit or avoid:  Avoid fast food, fried foods, fatty foods  Limit sweets, ice cream, cake and other baked goods  Avoid soda, beer, alcohol, sweet tea  Exercise  You need to be exercising most days of the week for 30-45 minutes or more  Good forms of exercise include walking, hiking, stationary bike or bicycling outside, lap swimming, aerobics class, dance, Zumba  Consider getting a trainer at a gym to help with exercise  Medication  Begin Qsymia weight loss medication  Start by taking the Qsymia 3.75/23 mg dose, once daily in the morning before breakfast for the first 2 weeks  Then increase to the Qsymia 7.5/46mg  dose, once daily in the morning before breakfast  You will need to use the coupon card to call and activate the medication  If your insurance does not cover the weight loss medicine listed above, check on the insurance coverage for the following medications:  Saxenda  Contrave  Consider weighing yourself daily to keep track of your weight

## 2015-10-26 ENCOUNTER — Telehealth: Payer: Self-pay | Admitting: Medical

## 2015-10-26 NOTE — Telephone Encounter (Signed)
P.A. QSYMIA 

## 2015-10-28 NOTE — Telephone Encounter (Signed)
P.A. Denied because diagnosis is not a covered benefit and excluded from coverage, Pt informed.  I asked her to call her insurance company to see if there are any weight loss meds that they cover, probably not but she will check.  She will call me back afterwards.

## 2015-11-05 NOTE — Telephone Encounter (Signed)
Pt called insurance company & they states there is no covered weight loss meds.  Do you want to switch her to Phentermine which seems to be the cheapest when paying out of pocket?

## 2015-11-06 ENCOUNTER — Other Ambulatory Visit: Payer: Self-pay | Admitting: Medical

## 2015-11-06 MED ORDER — PHENTERMINE HCL 37.5 MG PO TABS: 37.5000 mg | ORAL_TABLET | Freq: Every day | ORAL | Status: DC

## 2015-11-06 NOTE — Telephone Encounter (Signed)
Call out plain phentermine, take this once daily, and c/t healthy diet and exercise efforts, f/u 4-6 wk after starting this.

## 2015-11-07 NOTE — Telephone Encounter (Signed)
Pt is aware.  

## 2015-11-07 NOTE — Telephone Encounter (Signed)
Yes, but ask her to check BP a few times a week to make sure Bp not running high.

## 2015-11-07 NOTE — Telephone Encounter (Signed)
Pt is having breast reduction surgery on 12/04/15 so is it still ok to start this now?

## 2015-11-27 ENCOUNTER — Encounter (HOSPITAL_BASED_OUTPATIENT_CLINIC_OR_DEPARTMENT_OTHER): Payer: Self-pay | Admitting: *Deleted

## 2015-12-02 ENCOUNTER — Other Ambulatory Visit: Payer: Self-pay | Admitting: Plastic Surgery

## 2015-12-02 DIAGNOSIS — N62 Hypertrophy of breast: Secondary | ICD-10-CM

## 2015-12-02 NOTE — H&P (Signed)
Laura Cross is an 30 y.o. female.   Chief Complaint: symptomatic mammary hypertrophy HPI: The patient is a 30 yo female here for pre operative history and physical prior to bilateral breast reduction.  She has extremely large breasts causing symptoms that include the following: Back pain (upper and lower) and neck pain. She frequently pins bra cups higher on straps for better lift and relief. Notices relief when holding breast up in her hands. Shoulder straps causing grooves, pain occasionally requiring padding. Pain medication is sometimes required with motrin and tylenol. She was seen at an urgent care with complaints listed above.  Her breasts are extremely large and fairly symmetric. She has hyperpigmentation of the inframammary area on both sides. The sternal to nipple distance on the right is 32 and the left is 34. She is 5 feet 1 inches tall and weighs 190 pounds. Preoperative bra size = 40DD cup. The estimated excess breast tissue to be removed at the time of surgery is 550 grams on the left and 550 grams on the right.  Past Medical History  Diagnosis Date  . Yeast infection   . Bacterial infection   . Herpes     genital  . Syphilis 2010  . Ovarian cyst   . Sexually transmitted disease (STD)     hx/o HPV, gonorrhea, chlamydia, genital herpes  . Miscarriage 2014  . Family history of premature CAD     father  . Mixed dyslipidemia     Past Surgical History  Procedure Laterality Date  . No past surgeries  05/2015    Family History  Problem Relation Age of Onset  . Heart disease Father 88    died MI  . Hypertension Maternal Grandmother   . Diabetes Maternal Grandmother   . Stroke Maternal Grandmother   . Heart disease Maternal Grandmother   . Hypertension Maternal Grandfather   . COPD Maternal Grandfather   . GER disease Mother   . Deep vein thrombosis Mother     with MVA  . COPD Paternal Grandfather    Social History:  reports that she has never smoked. She has  never used smokeless tobacco. She reports that she drinks alcohol. She reports that she does not use illicit drugs.  Allergies:  Allergies  Allergen Reactions  . Meloxicam Other (See Comments)    Bleeding?  Can tolerate other NSAIDs  . Metformin And Related     Diarrhea, hair falling out  . Nuvaring [Etonogestrel-Ethinyl Estradiol]     Flu-like symptoms    (Not in a hospital admission)  No results found for this or any previous visit (from the past 48 hour(s)). No results found.  Review of Systems  Constitutional: Negative.   HENT: Negative.   Eyes: Negative.   Respiratory: Negative.   Cardiovascular: Negative.   Gastrointestinal: Negative.   Genitourinary: Negative.   Musculoskeletal: Negative.   Skin: Negative.   Psychiatric/Behavioral: Negative.     There were no vitals taken for this visit. Physical Exam  Constitutional: She is oriented to person, place, and time. She appears well-developed and well-nourished.  HENT:  Head: Normocephalic and atraumatic.  Eyes: Conjunctivae and EOM are normal. Pupils are equal, round, and reactive to light.  Cardiovascular: Normal rate.   Respiratory: Effort normal.  GI: Soft.  Musculoskeletal: Normal range of motion.  Neurological: She is alert and oriented to person, place, and time.  Skin: Skin is warm.  Psychiatric: She has a normal mood and affect. Her behavior is normal. Judgment  and thought content normal.     Assessment/Plan The risks that can be encountered with and after a breast reduction were discussed and include the following but not limited to these: breast asymmetry, fluid accumulation, firmness of the breast, inability to breast feed, loss of nipple or areola, skin loss, decrease or loss in nipple sensation, fat necrosis with death of fat tissue, bleeding, infection, delayed healing, anesthesia risks, skin sensation changes, injury to structures including nerves, blood vessels, and muscles which may be temporary or  permanent, allergies to tape, suture materials and glues, blood products, topical preparations or injected agents, skin and breast contour irregularities, skin discoloration and swelling, deep vein thrombosis, cardiac and pulmonary complications, pain, which may persist, persistent pain, recurrence of the lesion, poor healing of the incision, possible need for revisional surgery or staged procedures. A breast reduction can also interfere with certain diagnostic procedures. Breast and nipple piercing can cause an infection. This procedure can be performed at any age, but is best done when your breasts are fully developed. Changes in the breasts during pregnancy can alter the outcomes of previous breast reduction surgery, as can significant weight fluctuations. The patient desires to proceed and consent was obtained.   Wallace Going, DO 12/02/2015, 2:42 PM

## 2015-12-04 ENCOUNTER — Ambulatory Visit (HOSPITAL_BASED_OUTPATIENT_CLINIC_OR_DEPARTMENT_OTHER)
Admission: RE | Admit: 2015-12-04 | Discharge: 2015-12-04 | Disposition: A | Discharge status: Home / Self Care | Payer: 59 | Source: Ambulatory Visit | Attending: Plastic Surgery | Admitting: Plastic Surgery

## 2015-12-04 ENCOUNTER — Ambulatory Visit (HOSPITAL_BASED_OUTPATIENT_CLINIC_OR_DEPARTMENT_OTHER): Payer: 59 | Admitting: Anesthesiology

## 2015-12-04 ENCOUNTER — Encounter (HOSPITAL_BASED_OUTPATIENT_CLINIC_OR_DEPARTMENT_OTHER)
Admission: RE | Disposition: A | Discharge status: Home / Self Care | Payer: Self-pay | Source: Ambulatory Visit | Attending: Plastic Surgery

## 2015-12-04 ENCOUNTER — Encounter (HOSPITAL_BASED_OUTPATIENT_CLINIC_OR_DEPARTMENT_OTHER): Payer: Self-pay | Admitting: Anesthesiology

## 2015-12-04 DIAGNOSIS — M549 Dorsalgia, unspecified: Secondary | ICD-10-CM | POA: Diagnosis not present

## 2015-12-04 DIAGNOSIS — N62 Hypertrophy of breast: Secondary | ICD-10-CM | POA: Insufficient documentation

## 2015-12-04 DIAGNOSIS — E782 Mixed hyperlipidemia: Secondary | ICD-10-CM | POA: Diagnosis not present

## 2015-12-04 DIAGNOSIS — M542 Cervicalgia: Secondary | ICD-10-CM | POA: Diagnosis not present

## 2015-12-04 HISTORY — PX: BREAST REDUCTION SURGERY: SHX8

## 2015-12-04 LAB — POCT HEMOGLOBIN-HEMACUE: HEMOGLOBIN: 12.5 g/dL (ref 12.0–15.0)

## 2015-12-04 SURGERY — BREAST REDUCTION WITH LIPOSUCTION
Anesthesia: General | Site: Breast | Laterality: Bilateral

## 2015-12-04 MED ORDER — BUPIVACAINE-EPINEPHRINE (PF) 0.25% -1:200000 IJ SOLN
INTRAMUSCULAR | Status: AC
  Filled 2015-12-04: qty 90

## 2015-12-04 MED ORDER — MEPERIDINE HCL 25 MG/ML IJ SOLN: 6.2500 mg | INTRAMUSCULAR | Status: DC | PRN

## 2015-12-04 MED ORDER — SUCCINYLCHOLINE CHLORIDE 20 MG/ML IJ SOLN
INTRAMUSCULAR | Status: AC
  Filled 2015-12-04: qty 1

## 2015-12-04 MED ORDER — LIDOCAINE HCL (CARDIAC) 20 MG/ML IV SOLN
INTRAVENOUS | Status: AC
  Filled 2015-12-04: qty 5

## 2015-12-04 MED ORDER — DEXAMETHASONE SODIUM PHOSPHATE 10 MG/ML IJ SOLN
INTRAMUSCULAR | Status: AC
  Filled 2015-12-04: qty 1

## 2015-12-04 MED ORDER — PROPOFOL 10 MG/ML IV BOLUS
INTRAVENOUS | Status: DC | PRN
  Administered 2015-12-04: 150 mg via INTRAVENOUS

## 2015-12-04 MED ORDER — ATROPINE SULFATE 0.4 MG/ML IJ SOLN
INTRAMUSCULAR | Status: AC
  Filled 2015-12-04: qty 1

## 2015-12-04 MED ORDER — HYDROMORPHONE HCL 1 MG/ML IJ SOLN
0.2500 mg | INTRAMUSCULAR | Status: DC | PRN
  Administered 2015-12-04: 0.5 mg via INTRAVENOUS

## 2015-12-04 MED ORDER — ROCURONIUM BROMIDE 100 MG/10ML IV SOLN
INTRAVENOUS | Status: DC | PRN
  Administered 2015-12-04: 50 mg via INTRAVENOUS

## 2015-12-04 MED ORDER — EPINEPHRINE HCL 1 MG/ML IJ SOLN
INTRAMUSCULAR | Status: AC
  Filled 2015-12-04: qty 1

## 2015-12-04 MED ORDER — MIDAZOLAM HCL 2 MG/2ML IJ SOLN
INTRAMUSCULAR | Status: AC
Start: 2015-12-04 — End: 2015-12-04
  Filled 2015-12-04: qty 2

## 2015-12-04 MED ORDER — LIDOCAINE HCL (PF) 1 % IJ SOLN
INTRAMUSCULAR | Status: AC
  Filled 2015-12-04: qty 180

## 2015-12-04 MED ORDER — PROMETHAZINE HCL 25 MG/ML IJ SOLN
6.2500 mg | Freq: Once | INTRAMUSCULAR | Status: AC | PRN
  Administered 2015-12-04: 6.25 mg via INTRAVENOUS

## 2015-12-04 MED ORDER — PROPOFOL 10 MG/ML IV BOLUS
INTRAVENOUS | Status: AC
Start: 2015-12-04 — End: 2015-12-04
  Filled 2015-12-04: qty 20

## 2015-12-04 MED ORDER — HYDROMORPHONE HCL 1 MG/ML IJ SOLN
INTRAMUSCULAR | Status: AC
  Filled 2015-12-04: qty 1

## 2015-12-04 MED ORDER — GLYCOPYRROLATE 0.2 MG/ML IJ SOLN
0.2000 mg | Freq: Once | INTRAMUSCULAR | Status: AC | PRN
  Administered 2015-12-04: 0.2 mg via INTRAVENOUS

## 2015-12-04 MED ORDER — SUFENTANIL CITRATE 50 MCG/ML IV SOLN
INTRAVENOUS | Status: AC
  Filled 2015-12-04: qty 1

## 2015-12-04 MED ORDER — SUFENTANIL CITRATE 50 MCG/ML IV SOLN
INTRAVENOUS | Status: DC | PRN
  Administered 2015-12-04: 30 ug via INTRAVENOUS
  Administered 2015-12-04: 10 ug via INTRAVENOUS

## 2015-12-04 MED ORDER — LIDOCAINE-EPINEPHRINE (PF) 1 %-1:200000 IJ SOLN
INTRAMUSCULAR | Status: AC
  Filled 2015-12-04: qty 90

## 2015-12-04 MED ORDER — PROMETHAZINE HCL 25 MG/ML IJ SOLN
INTRAMUSCULAR | Status: AC
  Filled 2015-12-04: qty 1

## 2015-12-04 MED ORDER — PHENYLEPHRINE 40 MCG/ML (10ML) SYRINGE FOR IV PUSH (FOR BLOOD PRESSURE SUPPORT)
PREFILLED_SYRINGE | INTRAVENOUS | Status: AC
  Filled 2015-12-04: qty 10

## 2015-12-04 MED ORDER — LIDOCAINE HCL 1 % IJ SOLN
INTRAVENOUS | Status: DC | PRN
  Administered 2015-12-04: 500 mL

## 2015-12-04 MED ORDER — PROPOFOL 10 MG/ML IV BOLUS
INTRAVENOUS | Status: AC
  Filled 2015-12-04: qty 20

## 2015-12-04 MED ORDER — EPHEDRINE SULFATE 50 MG/ML IJ SOLN
INTRAMUSCULAR | Status: AC
  Filled 2015-12-04: qty 1

## 2015-12-04 MED ORDER — CEFAZOLIN SODIUM-DEXTROSE 2-4 GM/100ML-% IV SOLN
2.0000 g | INTRAVENOUS | Status: AC
  Administered 2015-12-04: 2 g via INTRAVENOUS

## 2015-12-04 MED ORDER — SCOPOLAMINE 1 MG/3DAYS TD PT72
MEDICATED_PATCH | TRANSDERMAL | Status: AC
  Filled 2015-12-04: qty 1

## 2015-12-04 MED ORDER — BUPIVACAINE-EPINEPHRINE 0.25% -1:200000 IJ SOLN
INTRAMUSCULAR | Status: DC | PRN
  Administered 2015-12-04: 21 mL

## 2015-12-04 MED ORDER — FENTANYL CITRATE (PF) 100 MCG/2ML IJ SOLN: 50.0000 ug | INTRAMUSCULAR | Status: DC | PRN

## 2015-12-04 MED ORDER — LACTATED RINGERS IV SOLN
INTRAVENOUS | Status: DC
  Administered 2015-12-04 (×3): via INTRAVENOUS

## 2015-12-04 MED ORDER — MIDAZOLAM HCL 2 MG/2ML IJ SOLN
1.0000 mg | INTRAMUSCULAR | Status: DC | PRN
  Administered 2015-12-04: 2 mg via INTRAVENOUS

## 2015-12-04 MED ORDER — CEFAZOLIN SODIUM-DEXTROSE 2-4 GM/100ML-% IV SOLN
INTRAVENOUS | Status: AC
  Filled 2015-12-04: qty 100

## 2015-12-04 MED ORDER — SCOPOLAMINE 1 MG/3DAYS TD PT72
1.0000 | MEDICATED_PATCH | Freq: Once | TRANSDERMAL | Status: DC | PRN
  Administered 2015-12-04: 1.5 mg via TRANSDERMAL

## 2015-12-04 MED ORDER — BACITRACIN-NEOMYCIN-POLYMYXIN 400-5-5000 EX OINT
TOPICAL_OINTMENT | CUTANEOUS | Status: AC
  Filled 2015-12-04: qty 1

## 2015-12-04 MED ORDER — POLYMYXIN B SULFATE 500000 UNITS IJ SOLR
INTRAMUSCULAR | Status: DC | PRN
  Administered 2015-12-04: 500 mL

## 2015-12-04 MED ORDER — ONDANSETRON HCL 4 MG/2ML IJ SOLN: 4.0000 mg | Freq: Once | INTRAMUSCULAR | Status: DC | PRN

## 2015-12-04 MED ORDER — DEXAMETHASONE SODIUM PHOSPHATE 4 MG/ML IJ SOLN
INTRAMUSCULAR | Status: DC | PRN
  Administered 2015-12-04: 10 mg via INTRAVENOUS

## 2015-12-04 MED ORDER — ONDANSETRON HCL 4 MG/2ML IJ SOLN
INTRAMUSCULAR | Status: AC
  Filled 2015-12-04: qty 2

## 2015-12-04 MED ORDER — NEOSTIGMINE METHYLSULFATE 10 MG/10ML IV SOLN
INTRAVENOUS | Status: DC | PRN
  Administered 2015-12-04: 1.5 mg via INTRAVENOUS

## 2015-12-04 MED ORDER — ONDANSETRON HCL 4 MG/2ML IJ SOLN
INTRAMUSCULAR | Status: DC | PRN
  Administered 2015-12-04: 4 mg via INTRAVENOUS

## 2015-12-04 MED ORDER — LIDOCAINE HCL (CARDIAC) 20 MG/ML IV SOLN
INTRAVENOUS | Status: DC | PRN
  Administered 2015-12-04: 10 mg via INTRAVENOUS
  Administered 2015-12-04: 100 mg via INTRAVENOUS

## 2015-12-04 SURGICAL SUPPLY — 68 items
ADH SKN CLS APL DERMABOND .7 (GAUZE/BANDAGES/DRESSINGS)
BAG DECANTER FOR FLEXI CONT (MISCELLANEOUS) ×2 IMPLANT
BINDER BREAST LRG (GAUZE/BANDAGES/DRESSINGS) IMPLANT
BINDER BREAST MEDIUM (GAUZE/BANDAGES/DRESSINGS) IMPLANT
BINDER BREAST XLRG (GAUZE/BANDAGES/DRESSINGS) ×1 IMPLANT
BINDER BREAST XXLRG (GAUZE/BANDAGES/DRESSINGS) IMPLANT
BLADE HEX COATED 2.75 (ELECTRODE) ×2 IMPLANT
BLADE KNIFE PERSONA 10 (BLADE) ×4 IMPLANT
BLADE SURG 15 STRL LF DISP TIS (BLADE) IMPLANT
BLADE SURG 15 STRL SS (BLADE)
BNDG GAUZE ELAST 4 BULKY (GAUZE/BANDAGES/DRESSINGS) ×4 IMPLANT
CANISTER SUCT 1200ML W/VALVE (MISCELLANEOUS) ×2 IMPLANT
CHLORAPREP W/TINT 26ML (MISCELLANEOUS) ×2 IMPLANT
COVER BACK TABLE 60X90IN (DRAPES) ×2 IMPLANT
COVER MAYO STAND STRL (DRAPES) ×2 IMPLANT
DECANTER SPIKE VIAL GLASS SM (MISCELLANEOUS) IMPLANT
DERMABOND ADVANCED (GAUZE/BANDAGES/DRESSINGS)
DERMABOND ADVANCED .7 DNX12 (GAUZE/BANDAGES/DRESSINGS) IMPLANT
DRAIN CHANNEL 19F RND (DRAIN) IMPLANT
DRAPE LAPAROSCOPIC ABDOMINAL (DRAPES) ×2 IMPLANT
DRSG PAD ABDOMINAL 8X10 ST (GAUZE/BANDAGES/DRESSINGS) ×4 IMPLANT
ELECT BLADE 4.0 EZ CLEAN MEGAD (MISCELLANEOUS)
ELECT REM PT RETURN 9FT ADLT (ELECTROSURGICAL) ×2
ELECTRODE BLDE 4.0 EZ CLN MEGD (MISCELLANEOUS) IMPLANT
ELECTRODE REM PT RTRN 9FT ADLT (ELECTROSURGICAL) ×1 IMPLANT
EVACUATOR SILICONE 100CC (DRAIN) IMPLANT
FILTER LIPOSUCTION (MISCELLANEOUS) IMPLANT
GLOVE BIO SURGEON STRL SZ 6.5 (GLOVE) ×4 IMPLANT
GLOVE BIOGEL PI IND STRL 7.0 (GLOVE) IMPLANT
GLOVE BIOGEL PI INDICATOR 7.0 (GLOVE) ×4
GLOVE ECLIPSE 6.5 STRL STRAW (GLOVE) ×5 IMPLANT
GLOVE SURG SS PI 6.5 STRL IVOR (GLOVE) ×1 IMPLANT
GOWN STRL REUS W/ TWL LRG LVL3 (GOWN DISPOSABLE) ×2 IMPLANT
GOWN STRL REUS W/TWL LRG LVL3 (GOWN DISPOSABLE) ×10
LIQUID BAND (GAUZE/BANDAGES/DRESSINGS) ×4 IMPLANT
NDL HYPO 25X1 1.5 SAFETY (NEEDLE) ×1 IMPLANT
NDL SAFETY ECLIPSE 18X1.5 (NEEDLE) IMPLANT
NEEDLE HYPO 18GX1.5 SHARP (NEEDLE) ×2
NEEDLE HYPO 25X1 1.5 SAFETY (NEEDLE) ×2 IMPLANT
NS IRRIG 1000ML POUR BTL (IV SOLUTION) ×1 IMPLANT
PACK BASIN DAY SURGERY FS (CUSTOM PROCEDURE TRAY) ×2 IMPLANT
PAD ALCOHOL SWAB (MISCELLANEOUS) ×1 IMPLANT
PENCIL BUTTON HOLSTER BLD 10FT (ELECTRODE) ×2 IMPLANT
SLEEVE SCD COMPRESS KNEE MED (MISCELLANEOUS) ×2 IMPLANT
SPONGE LAP 18X18 X RAY DECT (DISPOSABLE) ×4 IMPLANT
STRIP SUTURE WOUND CLOSURE 1/2 (SUTURE) ×4 IMPLANT
SUT MNCRL AB 4-0 PS2 18 (SUTURE) ×7 IMPLANT
SUT MON AB 3-0 SH 27 (SUTURE) ×4
SUT MON AB 3-0 SH27 (SUTURE) ×1 IMPLANT
SUT MON AB 5-0 PS2 18 (SUTURE) ×7 IMPLANT
SUT PDS 3-0 CT2 (SUTURE)
SUT PDS AB 2-0 CT2 27 (SUTURE) IMPLANT
SUT PDS II 3-0 CT2 27 ABS (SUTURE) IMPLANT
SUT SILK 3 0 PS 1 (SUTURE) IMPLANT
SUT VIC AB 3-0 SH 27 (SUTURE)
SUT VIC AB 3-0 SH 27X BRD (SUTURE) IMPLANT
SUT VICRYL 4-0 PS2 18IN ABS (SUTURE) IMPLANT
SYR 3ML 23GX1 SAFETY (SYRINGE) ×2 IMPLANT
SYR 50ML LL SCALE MARK (SYRINGE) IMPLANT
SYR BULB IRRIGATION 50ML (SYRINGE) ×2 IMPLANT
SYR CONTROL 10ML LL (SYRINGE) ×2 IMPLANT
TAPE MEASURE VINYL STERILE (MISCELLANEOUS) ×1 IMPLANT
TOWEL OR 17X24 6PK STRL BLUE (TOWEL DISPOSABLE) ×4 IMPLANT
TUBE CONNECTING 20X1/4 (TUBING) ×2 IMPLANT
TUBING INFILTRATION IT-10001 (TUBING) ×1 IMPLANT
TUBING SET GRADUATE ASPIR 12FT (MISCELLANEOUS) ×1 IMPLANT
UNDERPAD 30X30 (UNDERPADS AND DIAPERS) ×4 IMPLANT
YANKAUER SUCT BULB TIP NO VENT (SUCTIONS) ×2 IMPLANT

## 2015-12-04 NOTE — Interval H&P Note (Signed)
History and Physical Interval Note:  12/04/2015 7:54 AM  Laura Cross  has presented today for surgery, with the diagnosis of BREAST HYPERTROPHY, UPPER AND LOWER BACK PAIN, NECK PAIN, SHOULDER STRAP GROOVING, BREAST PAIN  The various methods of treatment have been discussed with the patient and family. After consideration of risks, benefits and other options for treatment, the patient has consented to  Procedure(s): BILATERAL BREAST REDUCTION   (Bilateral) as a surgical intervention .  The patient's history has been reviewed, patient examined, no change in status, stable for surgery.  I have reviewed the patient's chart and labs.  Questions were answered to the patient's satisfaction.     Wallace Going

## 2015-12-04 NOTE — Anesthesia Procedure Notes (Signed)
Procedure Name: Intubation Date/Time: 12/04/2015 8:36 AM Performed by: Melynda Ripple D Pre-anesthesia Checklist: Patient identified, Emergency Drugs available, Suction available and Patient being monitored Patient Re-evaluated:Patient Re-evaluated prior to inductionOxygen Delivery Method: Circle System Utilized Preoxygenation: Pre-oxygenation with 100% oxygen Intubation Type: IV induction Ventilation: Mask ventilation without difficulty Laryngoscope Size: Mac and 3 Grade View: Grade I Tube type: Oral Number of attempts: 1 Airway Equipment and Method: Stylet and Oral airway Placement Confirmation: ETT inserted through vocal cords under direct vision,  positive ETCO2 and breath sounds checked- equal and bilateral Secured at: 22 cm Tube secured with: Tape Dental Injury: Teeth and Oropharynx as per pre-operative assessment

## 2015-12-04 NOTE — H&P (View-Only) (Signed)
Laura Cross is an 30 y.o. female.   Chief Complaint: symptomatic mammary hypertrophy HPI: The patient is a 30 yo female here for pre operative history and physical prior to bilateral breast reduction.  She has extremely large breasts causing symptoms that include the following: Back pain (upper and lower) and neck pain. She frequently pins bra cups higher on straps for better lift and relief. Notices relief when holding breast up in her hands. Shoulder straps causing grooves, pain occasionally requiring padding. Pain medication is sometimes required with motrin and tylenol. She was seen at an urgent care with complaints listed above.  Her breasts are extremely large and fairly symmetric. She has hyperpigmentation of the inframammary area on both sides. The sternal to nipple distance on the right is 32 and the left is 34. She is 5 feet 1 inches tall and weighs 190 pounds. Preoperative bra size = 40DD cup. The estimated excess breast tissue to be removed at the time of surgery is 550 grams on the left and 550 grams on the right.  Past Medical History  Diagnosis Date  . Yeast infection   . Bacterial infection   . Herpes     genital  . Syphilis 2010  . Ovarian cyst   . Sexually transmitted disease (STD)     hx/o HPV, gonorrhea, chlamydia, genital herpes  . Miscarriage 2014  . Family history of premature CAD     father  . Mixed dyslipidemia     Past Surgical History  Procedure Laterality Date  . No past surgeries  05/2015    Family History  Problem Relation Age of Onset  . Heart disease Father 55    died MI  . Hypertension Maternal Grandmother   . Diabetes Maternal Grandmother   . Stroke Maternal Grandmother   . Heart disease Maternal Grandmother   . Hypertension Maternal Grandfather   . COPD Maternal Grandfather   . GER disease Mother   . Deep vein thrombosis Mother     with MVA  . COPD Paternal Grandfather    Social History:  reports that she has never smoked. She has  never used smokeless tobacco. She reports that she drinks alcohol. She reports that she does not use illicit drugs.  Allergies:  Allergies  Allergen Reactions  . Meloxicam Other (See Comments)    Bleeding?  Can tolerate other NSAIDs  . Metformin And Related     Diarrhea, hair falling out  . Nuvaring [Etonogestrel-Ethinyl Estradiol]     Flu-like symptoms    (Not in a hospital admission)  No results found for this or any previous visit (from the past 48 hour(s)). No results found.  Review of Systems  Constitutional: Negative.   HENT: Negative.   Eyes: Negative.   Respiratory: Negative.   Cardiovascular: Negative.   Gastrointestinal: Negative.   Genitourinary: Negative.   Musculoskeletal: Negative.   Skin: Negative.   Psychiatric/Behavioral: Negative.     There were no vitals taken for this visit. Physical Exam  Constitutional: She is oriented to person, place, and time. She appears well-developed and well-nourished.  HENT:  Head: Normocephalic and atraumatic.  Eyes: Conjunctivae and EOM are normal. Pupils are equal, round, and reactive to light.  Cardiovascular: Normal rate.   Respiratory: Effort normal.  GI: Soft.  Musculoskeletal: Normal range of motion.  Neurological: She is alert and oriented to person, place, and time.  Skin: Skin is warm.  Psychiatric: She has a normal mood and affect. Her behavior is normal. Judgment  and thought content normal.     Assessment/Plan The risks that can be encountered with and after a breast reduction were discussed and include the following but not limited to these: breast asymmetry, fluid accumulation, firmness of the breast, inability to breast feed, loss of nipple or areola, skin loss, decrease or loss in nipple sensation, fat necrosis with death of fat tissue, bleeding, infection, delayed healing, anesthesia risks, skin sensation changes, injury to structures including nerves, blood vessels, and muscles which may be temporary or  permanent, allergies to tape, suture materials and glues, blood products, topical preparations or injected agents, skin and breast contour irregularities, skin discoloration and swelling, deep vein thrombosis, cardiac and pulmonary complications, pain, which may persist, persistent pain, recurrence of the lesion, poor healing of the incision, possible need for revisional surgery or staged procedures. A breast reduction can also interfere with certain diagnostic procedures. Breast and nipple piercing can cause an infection. This procedure can be performed at any age, but is best done when your breasts are fully developed. Changes in the breasts during pregnancy can alter the outcomes of previous breast reduction surgery, as can significant weight fluctuations. The patient desires to proceed and consent was obtained.   Wallace Going, DO 12/02/2015, 2:42 PM

## 2015-12-04 NOTE — Brief Op Note (Signed)
12/04/2015  8:28 AM  PATIENT:  Laura Cross  30 y.o. female  PRE-OPERATIVE DIAGNOSIS:  BREAST HYPERTROPHY, UPPER AND LOWER BACK PAIN, NECK PAIN, SHOULDER STRAP GROOVING, BREAST PAIN  POST-OPERATIVE DIAGNOSIS:  * No post-op diagnosis entered *  PROCEDURE:  Procedure(s): BILATERAL BREAST REDUCTION   (Bilateral)  SURGEON:  Surgeon(s) and Role:    * Claire S Dillingham, DO - Primary  ASSISTANTS: none   ANESTHESIA:   general  EBL:     BLOOD ADMINISTERED:none  DRAINS: none   LOCAL MEDICATIONS USED:  MARCAINE     SPECIMEN:  Source of Specimen:  bilateral breast tissue  DISPOSITION OF SPECIMEN:  PATHOLOGY  COUNTS:  YES  TOURNIQUET:  * No tourniquets in log *  DICTATION: .Dragon Dictation  PLAN OF CARE: Discharge to home after PACU  PATIENT DISPOSITION:  PACU - hemodynamically stable.   Delay start of Pharmacological VTE agent (>24hrs) due to surgical blood loss or risk of bleeding: no

## 2015-12-04 NOTE — Transfer of Care (Signed)
Immediate Anesthesia Transfer of Care Note  Patient: Laura Cross  Procedure(s) Performed: Procedure(s): BILATERAL BREAST REDUCTION  WITH LIPOSUCTION (Bilateral)  Patient Location: PACU  Anesthesia Type:General  Level of Consciousness: awake, alert  and oriented  Airway & Oxygen Therapy: Patient Spontanous Breathing and Patient connected to face mask oxygen  Post-op Assessment: Report given to RN and Post -op Vital signs reviewed and stable  Post vital signs: Reviewed and stable  Last Vitals:  Filed Vitals:   12/04/15 0731  BP: 123/80  Pulse: 74  Temp: 36.8 C  Resp: 16    Last Pain: There were no vitals filed for this visit.    Patients Stated Pain Goal: 0 ([id number])  Complications: No apparent anesthesia complications

## 2015-12-04 NOTE — Discharge Instructions (Signed)
No heavy lifting May shower tomorrow Continue binder or sports bra Up walking around    Post Anesthesia Home Care Instructions  Activity: Get plenty of rest for the remainder of the day. A responsible adult should stay with you for 24 hours following the procedure.  For the next 24 hours, DO NOT: -Drive a car -Paediatric nurse -Drink alcoholic beverages -Take any medication unless instructed by your physician -Make any legal decisions or sign important papers.  Meals: Start with liquid foods such as gelatin or soup. Progress to regular foods as tolerated. Avoid greasy, spicy, heavy foods. If nausea and/or vomiting occur, drink only clear liquids until the nausea and/or vomiting subsides. Call your physician if vomiting continues.  Special Instructions/Symptoms: Your throat may feel dry or sore from the anesthesia or the breathing tube placed in your throat during surgery. If this causes discomfort, gargle with warm salt water. The discomfort should disappear within 24 hours.  If you had a scopolamine patch placed behind your ear for the management of post- operative nausea and/or vomiting:  1. The medication in the patch is effective for 72 hours, after which it should be removed.  Wrap patch in a tissue and discard in the trash. Wash hands thoroughly with soap and water. 2. You may remove the patch earlier than 72 hours if you experience unpleasant side effects which may include dry mouth, dizziness or visual disturbances. 3. Avoid touching the patch. Wash your hands with soap and water after contact with the patch.

## 2015-12-04 NOTE — Anesthesia Postprocedure Evaluation (Signed)
Anesthesia Post Note  Patient: Laura Cross  Procedure(s) Performed: Procedure(s) (LRB): BILATERAL BREAST REDUCTION  WITH LIPOSUCTION (Bilateral)  Patient location during evaluation: PACU Anesthesia Type: General Level of consciousness: awake and alert Pain management: pain level controlled Vital Signs Assessment: post-procedure vital signs reviewed and stable Respiratory status: non-rebreather facemask Cardiovascular status: blood pressure returned to baseline and stable Postop Assessment: no signs of nausea or vomiting Anesthetic complications: no    Last Vitals:  Filed Vitals:   12/04/15 1415 12/04/15 1430  BP: 108/71 110/60  Pulse: 96 88  Temp:    Resp:      Last Pain:  Filed Vitals:   12/04/15 1510  PainSc: 2                  Taunya Goral DAVID

## 2015-12-04 NOTE — Anesthesia Preprocedure Evaluation (Signed)
Anesthesia Evaluation  Patient identified by MRN, date of birth, ID band Patient awake    Reviewed: Allergy & Precautions, NPO status , Patient's Chart, lab work & pertinent test results  Airway Mallampati: I  TM Distance: >3 FB Neck ROM: Full    Dental   Pulmonary    Pulmonary exam normal       Cardiovascular Normal cardiovascular exam    Neuro/Psych    GI/Hepatic   Endo/Other    Renal/GU      Musculoskeletal   Abdominal   Peds  Hematology   Anesthesia Other Findings   Reproductive/Obstetrics                             Anesthesia Physical Anesthesia Plan  ASA: I  Anesthesia Plan: General   Post-op Pain Management:    Induction: Intravenous  Airway Management Planned: Oral ETT  Additional Equipment:   Intra-op Plan:   Post-operative Plan: Extubation in OR  Informed Consent: I have reviewed the patients History and Physical, chart, labs and discussed the procedure including the risks, benefits and alternatives for the proposed anesthesia with the patient or authorized representative who has indicated his/her understanding and acceptance.     Plan Discussed with: CRNA and Surgeon  Anesthesia Plan Comments:         Anesthesia Quick Evaluation  

## 2015-12-04 NOTE — Op Note (Signed)
Breast Reduction Op note:    DATE OF PROCEDURE: 12/04/2015  LOCATION: [place]  SURGEON: Lyndee Leo Sanger Haasini Patnaude, DO  PREOPERATIVE DIAGNOSIS 1. Macromastia 2. Neck Pain 3. Back Pain  POSTOPERATIVE DIAGNOSIS 1. Macromastia 2. Neck Pain 3. Back Pain  PROCEDURES 1. Bilateral breast reduction.  Right reduction 842g, Left reduction 99991111  COMPLICATIONS: None.  DRAINS: none  INDICATIONS FOR PROCEDURE @FNAMEA @ Laura Cross is a 30 y.o. year-old female born on 10/09/1985,with a history of symptomatic macromastia with concominant back pain, neck pain, shoulder grooving from her bra.   MRN: [id number]  CONSENT Informed consent was obtained directly from the patient. The risks, benefits and alternatives were fully discussed. Specific risks including but not limited to bleeding, infection, hematoma, seroma, scarring, pain, nipple necrosis, asymmetry, poor cosmetic results, and need for further surgery were discussed. The patient had ample opportunity to have her questions answered to her satisfaction.  DESCRIPTION OF PROCEDURE  Patient was brought into the operating room and placed in a supine position.  SCDs were placed and appropriate padding was performed.  Antibiotics were given. The patient underwent general anesthesia and the chest was prepped and draped in a sterile fashion.  A timeout was performed and all information was confirmed to be correct.  Right side: Preoperative markings were confirmed.  Incision lines were injected with 1% Xylocaine with epinephrine.  After waiting for vasoconstriction, the marked lines were incised.  A Wise-pattern superomedial breast reduction was performed by de-epithelializing the pedicle, using bovie to create the superomedial pedicle, and removing breast tissue from the superior, lateral, and inferior portions of the breast.  Care was taken to not undermine the breast pedicle. Hemostasis was achieved.  The nipple was gently  rotated into position and the soft tissue closed with 4-0 Monocryl.   The pocket was irrigated and hemostasis confirmed.  Tumescent was placed in the lateral breast and axillary areas and liposuction done for reduction and better contour.   The deep tissues were approximated with 3-0 monocryl sutures and the skin was closed with deep dermal and subcuticular 4-0 Monocryl sutures.  The nipple and skin flaps had good capillary refill at the end of the procedure.    Left side: Preoperative markings were confirmed.  Incision lines were injected with 1% Xylocaine with epinephrine.  After waiting for vasoconstriction, the marked lines were incised.  A Wise-pattern superomedial breast reduction was performed by de-epithelializing the pedicle, using bovie to create the superomedial pedicle, and removing breast tissue from the superior, lateral, and inferior portions of the breast.  Care was taken to not undermine the breast pedicle. Hemostasis was achieved.  The nipple was gently rotated into position and the soft tissue was closed with 4-0 Monocryl.  The patient was sat upright and size and shape symmetry was confirmed.  The pocket was irrigated and hemostasis confirmed.  The deep tissues were approximated with 3-0 monocryl sutures and the skin was closed with deep dermal and subcuticular 4-0 Monocryl sutures. Tumescent was placed in the lateral breast and axillary areas and liposuction done for reduction and better contour.   Dermabond was applied.  A breast binder and ABDs were placed.  The nipple and skin flaps had good capillary refill at the end of the procedure.  The patient tolerated the procedure well. The patient was allowed to wake from anesthesia and taken to the recovery room in satisfactory condition

## 2015-12-05 ENCOUNTER — Encounter (HOSPITAL_BASED_OUTPATIENT_CLINIC_OR_DEPARTMENT_OTHER): Payer: Self-pay | Admitting: Plastic Surgery

## 2016-03-14 ENCOUNTER — Ambulatory Visit (INDEPENDENT_AMBULATORY_CARE_PROVIDER_SITE_OTHER): Payer: 59 | Admitting: Urgent Care

## 2016-03-14 ENCOUNTER — Encounter: Payer: Self-pay | Admitting: Urgent Care

## 2016-03-14 ENCOUNTER — Other Ambulatory Visit: Payer: Self-pay | Admitting: *Deleted

## 2016-03-14 VITALS — BP 118/80 | HR 80 | Temp 98.0°F | Resp 18 | Ht 61.0 in | Wt 186.2 lb

## 2016-03-14 DIAGNOSIS — Z8619 Personal history of other infectious and parasitic diseases: Secondary | ICD-10-CM

## 2016-03-14 DIAGNOSIS — R21 Rash and other nonspecific skin eruption: Secondary | ICD-10-CM | POA: Diagnosis not present

## 2016-03-14 DIAGNOSIS — N898 Other specified noninflammatory disorders of vagina: Secondary | ICD-10-CM

## 2016-03-14 DIAGNOSIS — Z7251 High risk heterosexual behavior: Secondary | ICD-10-CM | POA: Diagnosis not present

## 2016-03-14 LAB — POCT URINALYSIS DIP (MANUAL ENTRY)
BILIRUBIN UA: NEGATIVE
Glucose, UA: NEGATIVE
Ketones, POC UA: NEGATIVE
Leukocytes, UA: NEGATIVE
NITRITE UA: NEGATIVE
PH UA: 5.5
Protein Ur, POC: NEGATIVE
SPEC GRAV UA: 1.025
UROBILINOGEN UA: 0.2

## 2016-03-14 LAB — POCT WET + KOH PREP
Trich by wet prep: ABSENT
YEAST BY WET PREP: ABSENT
Yeast by KOH: ABSENT

## 2016-03-14 LAB — POCT URINE PREGNANCY: PREG TEST UR: NEGATIVE

## 2016-03-14 LAB — POC MICROSCOPIC URINALYSIS (UMFC): MUCUS RE: ABSENT

## 2016-03-14 MED ORDER — CEFTRIAXONE SODIUM 250 MG IJ SOLR
250.0000 mg | Freq: Once | INTRAMUSCULAR | Status: AC
  Administered 2016-03-14: 250 mg via INTRAMUSCULAR

## 2016-03-14 MED ORDER — AZITHROMYCIN 500 MG PO TABS: 500.0000 mg | ORAL_TABLET | Freq: Once | ORAL | 0 refills | Status: DC

## 2016-03-14 MED ORDER — AZITHROMYCIN 500 MG PO TABS: 1000.0000 mg | ORAL_TABLET | Freq: Once | ORAL | 0 refills | Status: AC

## 2016-03-14 NOTE — Progress Notes (Signed)
MRN: [id number] DOB: November 30, 1985  Subjective:   Laura Cross is a 30 y.o. female presenting for chief complaint of Vaginal Discharge (burning, some itching, x last weekend, has a new sex partner)  Reports ~1 week history of vaginal discharge, intermittent pelvic pain, dysuria, feels that she has a rash that stings. She has had a new sex partner for the past 2 months. Patient has history of genital warts, has not had a major outbreak. Denies fever, n/v, hematuria, abdominal pain.  Laura Cross has a current medication list which includes the following prescription(s): multivitamin with minerals and phentermine. Also is allergic to meloxicam; metformin and related; and nuvaring [etonogestrel-ethinyl estradiol].  Laura Cross  has a past medical history of Bacterial infection; Family history of premature CAD; Herpes; Miscarriage (2014); Mixed dyslipidemia; Ovarian cyst; Sexually transmitted disease (STD); Syphilis (2010); and Yeast infection. Also  has a past surgical history that includes No past surgeries (05/2015) and Breast reduction surgery (Bilateral, 12/04/2015).  Objective:   Vitals: BP 118/80 (BP Location: Right Arm, Patient Position: Sitting, Cuff Size: Normal)   Pulse 80   Temp 98 F (36.7 C) (Oral)   Resp 18   Ht 5\' 1"  (1.549 m)   Wt 186 lb 3.2 oz (84.5 kg)   LMP 02/24/2016   SpO2 97%   BMI 35.18 kg/m   Physical Exam  Constitutional: She is oriented to person, place, and time. She appears well-developed and well-nourished.  Cardiovascular: Normal rate, regular rhythm and intact distal pulses.  Exam reveals no gallop and no friction rub.   No murmur heard. Pulmonary/Chest: No respiratory distress. She has no wheezes. She has no rales.  Abdominal: Soft. Bowel sounds are normal. She exhibits no distension. There is tenderness (mild mid-abdominal).  Genitourinary:    Uterus is not deviated, not enlarged, not fixed and not tender. Cervix exhibits no motion tenderness, no discharge  and no friability. Right adnexum displays no mass, no tenderness and no fullness. Left adnexum displays no mass, no tenderness and no fullness. No erythema, tenderness or bleeding in the vagina. No foreign body in the vagina. No signs of injury around the vagina. No vaginal discharge found.  Neurological: She is alert and oriented to person, place, and time.  Skin: Skin is warm and dry.    Results for orders placed or performed in visit on 03/14/16 (from the past 24 hour(s))  POCT Microscopic Urinalysis ([facility])     Status: Abnormal   Collection Time: 03/14/16  8:37 AM  Result Value Ref Range   WBC,UR,HPF,POC Few (A) None WBC/hpf   RBC,UR,HPF,POC None None RBC/hpf   Bacteria Few (A) None, Too numerous to count   Mucus Absent Absent   Epithelial Cells, UR Per Microscopy Moderate (A) None, Too numerous to count cells/hpf  POCT urinalysis dipstick     Status: Abnormal   Collection Time: 03/14/16  8:37 AM  Result Value Ref Range   Color, UA yellow yellow   Clarity, UA clear clear   Glucose, UA negative negative   Bilirubin, UA negative negative   Ketones, POC UA negative negative   Spec Grav, UA 1.025    Blood, UA trace-lysed (A) negative   pH, UA 5.5    Protein Ur, POC negative negative   Urobilinogen, UA 0.2    Nitrite, UA Negative Negative   Leukocytes, UA Negative Negative  POCT Wet + KOH Prep     Status: Abnormal   Collection Time: 03/14/16  8:39 AM  Result Value Ref Range  Yeast by KOH Absent Present, Absent   Yeast by wet prep Absent Present, Absent   WBC by wet prep Few None, Few, Too numerous to count   Clue Cells Wet Prep HPF POC Few (A) None, Too numerous to count   Trich by wet prep Absent Present, Absent   Bacteria Wet Prep HPF POC Few None, Few, Too numerous to count   Epithelial Cells By [employer] ([facility]) Many (A) None, Few, Too numerous to count   RBC,UR,HPF,POC Few (A) None RBC/hpf  POCT urine pregnancy     Status: None   Collection Time: 03/14/16  8:39 AM  Result  Value Ref Range   Preg Test, Ur Negative Negative    Assessment and Plan :   1. Vaginal discharge 2. Rash of genital area 3. High risk sexual behavior 4. History of genital warts - Labs pending, will cover empirically for gonorrhea and chlamydia. At this point treatment for genital herpes may not be effective. I discussed this with the patient and she declined treatment until we see her labs. Follow up in 1 week if no improvement.  Jaynee Eagles, PA-C [facility] [phone] 03/14/2016 8:20 AM

## 2016-03-14 NOTE — Patient Instructions (Addendum)
Safe Sex Safe sex is about reducing the risk of giving or getting a sexually transmitted disease (STD). STDs are spread through sexual contact involving the genitals, mouth, or rectum. Some STDs can be cured and others cannot. Safe sex can also prevent unintended pregnancies.  WHAT ARE SOME SAFE SEX PRACTICES?  Limit your sexual activity to only one partner who is having sex with only you.  Talk to your partner about his or her past partners, past STDs, and drug use.  Use a condom every time you have sexual intercourse. This includes vaginal, oral, and anal sexual activity. Both females and males should wear condoms during oral sex. Only use latex or polyurethane condoms and water-based lubricants. Using petroleum-based lubricants or oils to lubricate a condom will weaken the condom and increase the chance that it will break. The condom should be in place from the beginning to the end of sexual activity. Wearing a condom reduces, but does not completely eliminate, your risk of getting or giving an STD. STDs can be spread by contact with infected body fluids and skin.  Get vaccinated for hepatitis B and HPV.  Avoid alcohol and recreational drugs, which can affect your judgment. You may forget to use a condom or participate in high-risk sex.  For females, avoid douching after sexual intercourse. Douching can spread an infection farther into the reproductive tract.  Check your body for signs of sores, blisters, rashes, or unusual discharge. See your health care provider if you notice any of these signs.  Avoid sexual contact if you have symptoms of an infection or are being treated for an STD. If you or your partner has herpes, avoid sexual contact when blisters are present. Use condoms at all other times.  If you are at risk of being infected with HIV, it is recommended that you take a prescription medicine daily to prevent HIV infection. This is called pre-exposure prophylaxis (PrEP). You are  considered at risk if:  You are a man who has sex with other men (MSM).  You are a heterosexual man or woman who is sexually active with more than one partner.  You take drugs by injection.  You are sexually active with a partner who has HIV.  Talk with your health care provider about whether you are at high risk of being infected with HIV. If you choose to begin PrEP, you should first be tested for HIV. You should then be tested every 3 months for as long as you are taking PrEP.  See your health care provider for regular screenings, exams, and tests for other STDs. Before having sex with a new partner, each of you should be screened for STDs and should talk about the results with each other. WHAT ARE THE BENEFITS OF SAFE SEX?   There is less chance of getting or giving an STD.  You can prevent unwanted or unintended pregnancies.  By discussing safe sex concerns with your partner, you may increase feelings of intimacy, comfort, trust, and honesty between the two of you.   This information is not intended to replace advice given to you by your health care provider. Make sure you discuss any questions you have with your health care provider.   Document Released: 09/03/2004 Document Revised: 08/17/2014 Document Reviewed: 01/18/2012 Elsevier Interactive Patient Education 2016 [employer].     IF you received an x-ray today, you will receive an invoice from [facility]. Please contact [facility] at [phone] with questions or concerns regarding your invoice.  IF you received labwork today, you will receive an invoice from [employer]. Please contact [facility] at [phone] with questions or concerns regarding your invoice.   Our billing staff will not be able to assist you with questions regarding bills from these companies.  You will be contacted with the lab results as soon as they are available. The fastest way to get your results  is to activate your My Chart account. Instructions are located on the last page of this paperwork. If you have not heard from [name] regarding the results in 2 weeks, please contact this office.

## 2016-03-17 LAB — SPECIMEN STATUS

## 2016-03-17 LAB — GC/CHLAMYDIA PROBE AMP
CHLAMYDIA, DNA PROBE: NEGATIVE
NEISSERIA GONORRHOEAE BY PCR: NEGATIVE

## 2016-03-17 LAB — RPR: RPR Ser Ql: NONREACTIVE

## 2016-03-17 LAB — HIV ANTIBODY (ROUTINE TESTING W REFLEX): HIV Screen 4th Generation wRfx: NONREACTIVE

## 2016-03-19 ENCOUNTER — Other Ambulatory Visit: Payer: Self-pay | Admitting: Urgent Care

## 2016-03-19 DIAGNOSIS — A6000 Herpesviral infection of urogenital system, unspecified: Secondary | ICD-10-CM

## 2016-03-19 LAB — HERPES SIMPLEX VIRUS CULTURE

## 2016-03-19 MED ORDER — ACYCLOVIR 400 MG PO TABS: 400.0000 mg | ORAL_TABLET | Freq: Two times a day (BID) | ORAL | 3 refills | Status: DC

## 2016-03-19 NOTE — Progress Notes (Signed)
Will have patient start suppression therapy for 400mg  BID for genital herpes.

## 2016-05-19 ENCOUNTER — Ambulatory Visit (INDEPENDENT_AMBULATORY_CARE_PROVIDER_SITE_OTHER): Payer: 59 | Admitting: Medical

## 2016-05-19 ENCOUNTER — Telehealth: Payer: Self-pay | Admitting: Medical

## 2016-05-19 ENCOUNTER — Encounter: Payer: Self-pay | Admitting: Medical

## 2016-05-19 VITALS — BP 136/88 | HR 79 | Ht 61.0 in | Wt 190.5 lb

## 2016-05-19 DIAGNOSIS — IMO0001 Reserved for inherently not codable concepts without codable children: Secondary | ICD-10-CM

## 2016-05-19 DIAGNOSIS — Z6835 Body mass index (BMI) 35.0-35.9, adult: Secondary | ICD-10-CM

## 2016-05-19 DIAGNOSIS — R7301 Impaired fasting glucose: Secondary | ICD-10-CM

## 2016-05-19 DIAGNOSIS — E669 Obesity, unspecified: Secondary | ICD-10-CM

## 2016-05-19 DIAGNOSIS — E782 Mixed hyperlipidemia: Secondary | ICD-10-CM

## 2016-05-19 DIAGNOSIS — Z8249 Family history of ischemic heart disease and other diseases of the circulatory system: Secondary | ICD-10-CM | POA: Diagnosis not present

## 2016-05-19 MED ORDER — PHENTERMINE-TOPIRAMATE ER 7.5-46 MG PO CP24: 1.0000 | ORAL_CAPSULE | ORAL | 1 refills | Status: DC

## 2016-05-19 MED ORDER — PHENTERMINE-TOPIRAMATE ER 3.75-23 MG PO CP24: 1.0000 | ORAL_CAPSULE | ORAL | 0 refills | Status: DC

## 2016-05-19 NOTE — Patient Instructions (Signed)
  Thank you for giving me the opportunity to serve you today.    Your diagnosis today includes: Encounter Diagnoses  Name Primary?  . Class 2 obesity with serious comorbidity and body mass index (BMI) of 35.0 to 35.9 in adult, unspecified obesity type Yes  . Impaired fasting blood sugar   . Mixed dyslipidemia   . Family history of premature CAD      Specific recommendations today include: Diet  Increase your water intake, get at least 64 ounces of water daily  Eat 3-4 fruits daily  Eat plenty of vegetables throughout the day, preferably each meal  Eat good sources of grains such as oatmeal, barley, whole grain pasta, whole grain bread, but limit the serving size to 1 cup of oatmeal or pasta per meal or 2 slices of bread per meal  We don't need to meat at each meal, however if you do eat meat, limit serving size to the size of your palm, and eat chicken fish or [place], lean cuts of meat  Eat beans every day as this is a good nutrient source and helps to curb appetite  Consider using a program such as Weight Watchers  Consider using a Smart phone app such as My Fitness [employer] or [employer] to track your calories and progress   Things to limit or avoid:  Avoid fast food, fried foods, fatty foods  Limit sweets, ice cream, cake and other baked goods  Avoid soda, beer, alcohol, sweet tea  Exercise  You need to be exercising most days of the week for 30-45 minutes or more  Good forms of exercise include walking, hiking, stationary bike or bicycling outside, lap swimming, aerobics class, dance, Zumba  Consider getting a trainer at a gym to help with exercise  Medication  Begin Qsymia weight loss medication  Start by taking the Qsymia 3.75/23 mg dose, once daily in the morning before breakfast for the first 2 weeks  Then increase to the Qsymia 7.5/46mg  dose, once daily in the morning before breakfast  You will need to use the coupon card to call and activate the  medication  If your insurance does not cover the weight loss medicine listed above, check on the insurance coverage for the following medications:  Saxenda  Contrave  Consider weighing yourself daily to keep track of your weight  Personal Trainer: Charolotte Eke [address],  [place], [place] [address] [phone] [email or web address]

## 2016-05-19 NOTE — Telephone Encounter (Signed)
Pt called & states QSYMIA requiring P.A.  States has tried Phentermine in the past.

## 2016-05-19 NOTE — Telephone Encounter (Signed)
I reviewed pt's chart and P.A. For Qsymia has already been done see 10/26/15 telephone call but was denied, pt's insurance does not cover any weight loss meds therefore you switched her to plain Phentermine as the cheapest out of pocket cost.

## 2016-05-19 NOTE — Progress Notes (Signed)
Subjective: Chief Complaint  Patient presents with  . Follow-up    on labs from work, needs forms completed    Here for concerns for obesity, weight loss efforts.   She has hx/o impaired glucose, mixed dyslipidemia, family hx/o premature CAD.  Recently had labs at work as part of health screen, has to work on [place] for insurance purposes.  Has form for me to complete.  currently not exercising but does walk a lot on her part time job.   Her main job is at [employer].  She recently had breast reduction surgery.    Last year she did use phentermine generic for a short term but this made her feel unusual like a zombie.  Didn't like the response.  She did lose 10 lb right off the bat with this and lifestyle changes.  She knows she needs to do better.   She has seen dietician within the last year.  No prior weight watchers  Past Medical History:  Diagnosis Date  . Bacterial infection   . Family history of premature CAD    father  . Herpes    genital  . Miscarriage 2014  . Mixed dyslipidemia   . Ovarian cyst   . Sexually transmitted disease (STD)    hx/o HPV, gonorrhea, chlamydia, genital herpes  . Syphilis 2010  . Yeast infection    Current Outpatient Prescriptions on File Prior to Visit  Medication Sig Dispense Refill  . acyclovir (ZOVIRAX) 400 MG tablet Take 1 tablet (400 mg total) by mouth 2 (two) times daily. 180 tablet 3  . Multiple Vitamins-Minerals (MULTIVITAMIN WITH MINERALS) tablet Take 1 tablet by mouth daily. Reported on 08/28/2015     No current facility-administered medications on file prior to visit.    ROS as in subjective   Objective; BP 136/88   Pulse 79   Ht 5\' 1"  (1.549 m)   Wt 190 lb 8 oz (86.4 kg)   LMP 05/03/2016 (Approximate)   SpO2 99%   BMI 35.99 kg/m   Wt Readings from Last 3 Encounters:  05/19/16 190 lb 8 oz (86.4 kg)  03/14/16 186 lb 3.2 oz (84.5 kg)  12/04/15 187 lb (84.8 kg)   General appearance: alert, no distress, AA  female Neck: supple,  no lymphadenopathy, no thyromegaly, no masses Heart: RRR, normal S1, S2, no murmurs Lungs: CTA bilaterally, no wheezes, rhonchi, or rales Pulses: 2+ symmetric, upper and lower extremities, normal cap refill Ext: no edema   Assessment: Encounter Diagnoses  Name Primary?  . Class 2 obesity with serious comorbidity and body mass index (BMI) of 35.0 to 35.9 in adult, unspecified obesity type Yes  . Impaired fasting blood sugar   . Mixed dyslipidemia   . Family history of premature CAD      Plan: Completed her form.  Begin back on Qsymia, and she did ok on 2 wk trial this past year.   Obesity associated conditions: hyperlipidemia and impaired glucse Reviewed recent 04/01/16 labs showing HgbA1C 6.0%, LDL 165, Total chol 253.     Treatment recommendations:  General weight loss/lifestyle modification strategies discussed (elicit support from others; identify saboteurs; non-food rewards, etc).  Behavioral treatment: Advised stress reduction where possible, consider counseling  Diet interventions: 1500 cal/day.  Informal exercise measures discussed, e.g. taking stairs instead of elevator, parking further away.  Regular aerobic exercise program discussed, including considering trainer  Discussed goal setting.  Patient voiced understanding of diagnosis, recommendations, and treatment plan.  After visit summary given.  Completed her insurance form/Health Screening form.  Spent > 30 minutes face to face with patient in discussion of symptoms, evaluation, plan and recommendations.    F/u 6-8 wk, sooner prn.

## 2016-05-20 NOTE — Telephone Encounter (Signed)
Left message for pt

## 2016-05-20 NOTE — Telephone Encounter (Signed)
Pt states she has called her insurance company and they told her they are now covering weight loss meds, they will pay for 65%, she called [facility] [phone] ID# [id number]  Send in another P.A. For Qsymia

## 2016-05-20 NOTE — Telephone Encounter (Signed)
Please let her know.  She didn't tolerate phentermine.   She can either pay out of pocket for qsymia, or I'd rather have her pay to work with a trainer for a month or 2 to get her going in the right direction.   I gave her info yesterday on a trainer, and this may ultimately be the cheapest route compared to paying for Qsymia.

## 2016-05-27 ENCOUNTER — Encounter: Payer: Self-pay | Admitting: Medical

## 2016-08-13 ENCOUNTER — Ambulatory Visit (INDEPENDENT_AMBULATORY_CARE_PROVIDER_SITE_OTHER): Payer: 59 | Admitting: Family Medicine

## 2016-08-13 ENCOUNTER — Telehealth: Payer: Self-pay | Admitting: Medical

## 2016-08-13 ENCOUNTER — Encounter: Payer: Self-pay | Admitting: Family Medicine

## 2016-08-13 VITALS — BP 110/60 | HR 76 | Temp 98.3°F | Resp 16 | Wt 192.2 lb

## 2016-08-13 DIAGNOSIS — R05 Cough: Secondary | ICD-10-CM | POA: Diagnosis not present

## 2016-08-13 DIAGNOSIS — J069 Acute upper respiratory infection, unspecified: Secondary | ICD-10-CM

## 2016-08-13 DIAGNOSIS — R059 Cough, unspecified: Secondary | ICD-10-CM

## 2016-08-13 MED ORDER — ALBUTEROL SULFATE HFA 108 (90 BASE) MCG/ACT IN AERS: 2.0000 | INHALATION_SPRAY | Freq: Four times a day (QID) | RESPIRATORY_TRACT | 0 refills | Status: DC | PRN

## 2016-08-13 MED ORDER — BENZONATATE 200 MG PO CAPS: 200.0000 mg | ORAL_CAPSULE | Freq: Two times a day (BID) | ORAL | 0 refills | Status: DC | PRN

## 2016-08-13 MED ORDER — AZITHROMYCIN 250 MG PO TABS: ORAL_TABLET | ORAL | 0 refills | Status: DC

## 2016-08-13 NOTE — Progress Notes (Signed)
Subjective: Chief Complaint  Patient presents with  . cough    cough- started 2 weeks ago. had a cold at first and got better but cough won't go away     Laura Cross is a 31 y.o. female who presents for a 1 week history of dry cough, post nasal drainage, and mild nasal congestion. States her cough is getting worse and had some wheezing yesterday and this morning.  States she had chills and body aches last week but that resolved.   No recent antibiotics. Does not smoke.   Denies fever, chills, headache, sinus pain, chest pain, palpitations, shortness of breath,   LMP: 07/17/16.  Contraception: condoms.   Treatment to date: cough suppressants and decongestants.  Denies sick contacts.  No other aggravating or relieving factors.  No other c/o.  ROS as in subjective.   Objective: Vitals:   08/13/16 1054  BP: 110/60  Pulse: 76  Resp: 16  Temp: 98.3 F (36.8 C)    General appearance: Alert, WD/WN, no distress, mildly ill appearing                             Skin: warm, no rash                           Head: no sinus tenderness                            Eyes: conjunctiva normal, corneas clear, PERRLA                            Ears: pearly TMs, external ear canals normal                          Nose: septum midline, turbinates swollen, with erythema and clear discharge             Mouth/throat: MMM, tongue normal, mild pharyngeal erythema                           Neck: supple, no adenopathy, no thyromegaly, nontender                          Heart: RRR, normal S1, S2, no murmurs                         Lungs: CTA bilaterally, no wheezes, rales, or rhonchi      Assessment: Cough  Acute URI   Plan: Discussed diagnosis and treatment of URI. Z-pak and albuterol inhaler. Advised that her cough may become more productive with the inhaler. She may hold off on the antibiotic for 2-3 days and see if she turns the corner on her own.  Suggested symptomatic OTC remedies.  Nasal saline spray for congestion.  Tylenol or Ibuprofen OTC for fever and malaise.  Call/return if needed.

## 2016-08-13 NOTE — Telephone Encounter (Signed)
Pt called stating that Albuterol is not covered by her insurance and insurance gave her 2 alternative inhalers that are covered. They are Pro Air HFA & Ventolin HFA

## 2016-08-14 MED ORDER — VENTOLIN HFA 108 (90 BASE) MCG/ACT IN AERS: 2.0000 | INHALATION_SPRAY | Freq: Four times a day (QID) | RESPIRATORY_TRACT | 0 refills | Status: DC | PRN

## 2016-08-14 NOTE — Telephone Encounter (Signed)
done

## 2016-08-14 NOTE — Telephone Encounter (Signed)
Please send in one of the other inhalers that will be covered for her.

## 2016-12-16 ENCOUNTER — Ambulatory Visit (INDEPENDENT_AMBULATORY_CARE_PROVIDER_SITE_OTHER): Payer: 59 | Admitting: Medical

## 2016-12-16 ENCOUNTER — Encounter: Payer: Self-pay | Admitting: Medical

## 2016-12-16 VITALS — BP 108/70 | HR 87 | Wt 196.8 lb

## 2016-12-16 DIAGNOSIS — M545 Low back pain, unspecified: Secondary | ICD-10-CM

## 2016-12-16 LAB — POCT URINALYSIS DIPSTICK
BILIRUBIN UA: NEGATIVE
GLUCOSE UA: NEGATIVE
Ketones, UA: NEGATIVE
Leukocytes, UA: NEGATIVE
Nitrite, UA: NEGATIVE
Protein, UA: NEGATIVE
RBC UA: NEGATIVE
Urobilinogen, UA: NEGATIVE E.U./dL — AB
pH, UA: 6 (ref 5.0–8.0)

## 2016-12-16 LAB — POCT URINE PREGNANCY: Preg Test, Ur: NEGATIVE

## 2016-12-16 MED ORDER — CYCLOBENZAPRINE HCL 10 MG PO TABS: 10.0000 mg | ORAL_TABLET | Freq: Every evening | ORAL | 0 refills | Status: DC | PRN

## 2016-12-16 NOTE — Progress Notes (Signed)
Subjective: Chief Complaint  Patient presents with  . Back Pain    back pain , and pain all over  starting on saturday , sharp pain in her back    Here for back pain x 4 days.   Pains mostly in left side lower.  No recent trauma, fall, strenuous exercise or heavy lifting  Saturday went to get up out of chair and felt pain, and has had pain since.    No urinary symptoms.  No burning with urination, no odor in urine, no fever, no nausea or vomiting.   Has some abdominal discomfort but thinks its related to cycle.  Period is a few days past day.  LMP 11/12/16.   Sexually active, uses condoms, not on birth control.  Not usually having back pain.  No other aggravating or relieving factors. No other complaint.   Past Medical History:  Diagnosis Date  . Bacterial infection   . Family history of premature CAD    father  . Herpes    genital  . Miscarriage 2014  . Mixed dyslipidemia   . Ovarian cyst   . Sexually transmitted disease (STD)    hx/o HPV, gonorrhea, chlamydia, genital herpes  . Syphilis 2010  . Yeast infection    Current Outpatient Prescriptions on File Prior to Visit  Medication Sig Dispense Refill  . Multiple Vitamins-Minerals (MULTIVITAMIN WITH MINERALS) tablet Take 1 tablet by mouth daily. Reported on 08/28/2015    . Phentermine-Topiramate (QSYMIA) 3.75-23 MG CP24 Take 1 capsule by mouth every morning. (Patient not taking: Reported on 12/16/2016) 14 capsule 0  . Phentermine-Topiramate (QSYMIA) 7.5-46 MG CP24 Take 1 capsule by mouth every morning. (Patient not taking: Reported on 12/16/2016) 30 capsule 1   No current facility-administered medications on file prior to visit.    ROS as in subjective   Objective: BP 108/70   Pulse 87   Wt 196 lb 12.8 oz (89.3 kg)   SpO2 98%   BMI 37.19 kg/m   General appearance: alert, no distress, WD/WN Abdomen: +bs, soft, non tender, non distended, no masses, no hepatomegaly, no splenomegaly Back: mild tenderness left lower paraspinal  region, no deformity or swelling, pain with ROM in general.   Musculoskeletal: legs non tender, no swelling, no obvious deformity, normal ROM Extremities: no edema, no cyanosis, no clubbing Pulses: 2+ symmetric, upper and lower extremities, normal cap refill Neurological: LE with normal strength and sensation, normal DTRs Psychiatric: normal affect, behavior normal, pleasant     Assessment: Encounter Diagnosis  Name Primary?  . Acute left-sided low back pain without sciatica Yes    Plan:  Discussed symptoms, exam findings.  Etiology appears to be musculoskeletal back pain and spasm . Urinalysis and urine pregnancy negative.   Advised several days of stretching, gentle range of motion activity, heat, OTC NSAID, and muscle relaxer below prn, caution sedation with this medication.   If not much improved in the next 3-4 days let me know.  If new symptoms such as fever, urinary c/o, vaginal c/o worse back pain, leg pain, etc. Then recheck.    [facility] was seen today for back pain.  Diagnoses and all orders for this visit:  Acute left-sided low back pain without sciatica  Other orders -     cyclobenzaprine (FLEXERIL) 10 MG tablet; Take 1 tablet (10 mg total) by mouth at bedtime as needed.

## 2017-05-31 ENCOUNTER — Ambulatory Visit (INDEPENDENT_AMBULATORY_CARE_PROVIDER_SITE_OTHER): Payer: 59 | Admitting: Medical

## 2017-05-31 ENCOUNTER — Encounter: Payer: Self-pay | Admitting: Medical

## 2017-05-31 VITALS — BP 132/80 | HR 84 | Ht 60.5 in | Wt 191.0 lb

## 2017-05-31 DIAGNOSIS — Z6836 Body mass index (BMI) 36.0-36.9, adult: Secondary | ICD-10-CM | POA: Diagnosis not present

## 2017-05-31 DIAGNOSIS — R7301 Impaired fasting glucose: Secondary | ICD-10-CM

## 2017-05-31 DIAGNOSIS — Z0000 Encounter for general adult medical examination without abnormal findings: Secondary | ICD-10-CM

## 2017-05-31 DIAGNOSIS — Z6835 Body mass index (BMI) 35.0-35.9, adult: Secondary | ICD-10-CM

## 2017-05-31 DIAGNOSIS — Z8249 Family history of ischemic heart disease and other diseases of the circulatory system: Secondary | ICD-10-CM | POA: Diagnosis not present

## 2017-05-31 DIAGNOSIS — E782 Mixed hyperlipidemia: Secondary | ICD-10-CM

## 2017-05-31 DIAGNOSIS — B009 Herpesviral infection, unspecified: Secondary | ICD-10-CM | POA: Diagnosis not present

## 2017-05-31 DIAGNOSIS — Z2821 Immunization not carried out because of patient refusal: Secondary | ICD-10-CM | POA: Insufficient documentation

## 2017-05-31 LAB — CBC
HEMATOCRIT: 39.5 % (ref 35.0–45.0)
Hemoglobin: 13.3 g/dL (ref 11.7–15.5)
MCH: 27 pg (ref 27.0–33.0)
MCHC: 33.7 g/dL (ref 32.0–36.0)
MCV: 80.1 fL (ref 80.0–100.0)
MPV: 10.2 fL (ref 7.5–12.5)
Platelets: 333 10*3/uL (ref 140–400)
RBC: 4.93 10*6/uL (ref 3.80–5.10)
RDW: 14.3 % (ref 11.0–15.0)
WBC: 3.1 10*3/uL — AB (ref 3.8–10.8)

## 2017-05-31 LAB — POCT URINALYSIS DIP (PROADVANTAGE DEVICE)
Bilirubin, UA: NEGATIVE
Blood, UA: NEGATIVE
Glucose, UA: NEGATIVE mg/dL
Ketones, POC UA: NEGATIVE mg/dL
LEUKOCYTES UA: NEGATIVE
Nitrite, UA: NEGATIVE
PROTEIN UA: NEGATIVE mg/dL
Specific Gravity, Urine: 1.025
UUROB: NEGATIVE
pH, UA: 6 (ref 5.0–8.0)

## 2017-05-31 LAB — COMPREHENSIVE METABOLIC PANEL
AG Ratio: 1.5 (calc) (ref 1.0–2.5)
ALBUMIN MSPROF: 4.8 g/dL (ref 3.6–5.1)
ALT: 56 U/L — AB (ref 6–29)
AST: 26 U/L (ref 10–30)
Alkaline phosphatase (APISO): 69 U/L (ref 33–115)
BUN: 16 mg/dL (ref 7–25)
CHLORIDE: 104 mmol/L (ref 98–110)
CO2: 24 mmol/L (ref 20–32)
CREATININE: 1.03 mg/dL (ref 0.50–1.10)
Calcium: 9.8 mg/dL (ref 8.6–10.2)
GLOBULIN: 3.1 g/dL (ref 1.9–3.7)
Glucose, Bld: 87 mg/dL (ref 65–99)
Potassium: 4.2 mmol/L (ref 3.5–5.3)
Sodium: 137 mmol/L (ref 135–146)
Total Bilirubin: 0.5 mg/dL (ref 0.2–1.2)
Total Protein: 7.9 g/dL (ref 6.1–8.1)

## 2017-05-31 LAB — TSH: TSH: 1.04 mIU/L

## 2017-05-31 NOTE — Progress Notes (Signed)
Subjective:   HPI  Laura Cross is a 31 y.o. female who presents for a complete physical.     Concerns: Feeling ok.    Has lost some weight recently.  She brought in recent labs from work from late August 2018, and cholesterol was high.  Needs biometric screen completed, has recent 03/2017 cholesterol labs to review.    She recently started ketogenic diet.  Abstinent with sex, no concern for STD, no concern for contraception at this time  Reviewed their medical, surgical, family, social, medication, and allergy history and updated chart as appropriate.  Past Medical History:  Diagnosis Date  . Bacterial infection   . Family history of premature CAD    father  . Herpes    genital  . Impaired fasting blood sugar   . Miscarriage 2014  . Mixed dyslipidemia   . Ovarian cyst   . Sexually transmitted disease (STD)    hx/o HPV, gonorrhea, chlamydia, genital herpes  . Syphilis 2010  . Yeast infection     Past Surgical History:  Procedure Laterality Date  . BREAST REDUCTION SURGERY Bilateral 12/04/2015   Procedure: BILATERAL BREAST REDUCTION  WITH LIPOSUCTION;  Surgeon: Wallace Going, DO;  Location: [facility];  Service: Plastics;  Laterality: Bilateral;    Social History   Social History  . Marital status: Single    Spouse name: N/A  . Number of children: N/A  . Years of education: N/A   Occupational History  . Not on file.   Social History Main Topics  . Smoking status: Never Smoker  . Smokeless tobacco: Never Used  . Alcohol use 0.0 oz/week     Comment: occasionally  . Drug use: No  . Sexual activity: Not Currently    Birth control/ protection: Abstinence   Other Topics Concern  . Not on file   Social History Narrative   Lives alone. No children.  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   Exercise  - some with walking. No significant other.  05/2017    Family History  Problem Relation Age of Onset  . Heart disease Father 68     died MI  . Hypertension Maternal Grandmother   . Diabetes Maternal Grandmother   . Stroke Maternal Grandmother   . Heart disease Maternal Grandmother   . Hypertension Maternal Grandfather   . COPD Maternal Grandfather   . GER disease Mother   . Deep vein thrombosis Mother        with MVA  . COPD Paternal Grandfather      Current Outpatient Prescriptions:  Marland Kitchen  Multiple Vitamins-Minerals (MULTIVITAMIN WITH MINERALS) tablet, Take 1 tablet by mouth daily. Reported on 08/28/2015, Disp: , Rfl:  .  Phentermine-Topiramate (QSYMIA) 3.75-23 MG CP24, Take 1 capsule by mouth every morning. (Patient not taking: Reported on 12/16/2016), Disp: 14 capsule, Rfl: 0  Allergies  Allergen Reactions  . Meloxicam Other (See Comments)    Bleeding?  Can tolerate other NSAIDs  . Metformin And Related     Diarrhea, hair falling out  . Nuvaring [Etonogestrel-Ethinyl Estradiol]     Flu-like symptoms    Review of Systems Constitutional: -fever, -chills, -sweats, -unexpected weight change, -decreased appetite, -fatigue Allergy: -sneezing, -itching, -congestion Dermatology: -changing moles, --rash, -lumps ENT: -runny nose, -ear pain, -sore throat, -hoarseness, -sinus pain, -teeth pain, - ringing in ears, -hearing loss, -nosebleeds Cardiology: -chest pain, -palpitations, -swelling, -difficulty breathing when lying flat, -waking up short of breath Respiratory: -cough, -shortness of breath, -  difficulty breathing with exercise or exertion, -wheezing, -coughing up blood Gastroenterology: -abdominal pain, -nausea, -vomiting, -diarrhea, -constipation, -blood in stool, -changes in bowel movement, -difficulty swallowing or eating Hematology: -bleeding, -bruising  Musculoskeletal: -joint aches, -muscle aches, -joint swelling, -back pain, -neck pain, -cramping, -changes in gait Ophthalmology: denies vision changes, eye redness, itching, discharge Urology: -burning with urination, -difficulty urinating, -blood in urine,  -urinary frequency, -urgency, -incontinence Neurology: -headache, -weakness, -tingling, -numbness, -memory loss, -falls, -dizziness Psychology: -depressed mood, -agitation, -sleep problems     Objective:   Physical Exam  BP 132/80   Pulse 84   Ht 5' 0.5" (1.537 m)   Wt 191 lb (86.6 kg)   SpO2 97%   BMI 36.69 kg/m   General appearance: alert, no distress, WD/WN, AA female Skin: small scattered skin tags of bilat cheeks and left lower eyelid, no worrisome lesions HEENT: normocephalic, conjunctiva/corneas normal, sclerae anicteric, PERRLA, EOMi, nares patent, no discharge or erythema, pharynx normal Oral cavity: MMM, tongue normal, teeth in good repair Neck: supple, no lymphadenopathy, no thyromegaly, no masses, normal ROM, no bruits Chest: non tender, normal shape and expansion Heart: RRR, normal S1, S2, no murmurs Lungs: CTA bilaterally, no wheezes, rhonchi, or rales Abdomen: +bs, soft, non tender, non distended, no masses, no hepatomegaly, no splenomegaly, no bruits Back: non tender, normal ROM, no scoliosis Musculoskeletal: upper extremities non tender, no obvious deformity, normal ROM throughout, lower extremities non tender, no obvious deformity, normal ROM throughout Extremities: no edema, no cyanosis, no clubbing Pulses: 2+ symmetric, upper and lower extremities, normal cap refill Neurological: alert, oriented x 3, CN2-12 intact, strength normal upper extremities and lower extremities, sensation normal throughout, DTRs 2+ throughout, no cerebellar signs, gait normal Psychiatric: normal affect, behavior normal, pleasant  Breast/gyn - declined   Assessment and Plan :    Encounter Diagnoses  Name Primary?  . Routine general medical examination at a health care facility Yes  . Impaired fasting blood sugar   . Family history of premature CAD   . Mixed dyslipidemia   . Class 2 severe obesity with serious comorbidity and body mass index (BMI) of 35.0 to 35.9 in adult,  unspecified obesity type ([place])   . Herpes   . Influenza vaccination declined   . Class 2 severe obesity with serious comorbidity and body mass index (BMI) of 36.0 to 36.9 in adult, unspecified obesity type [facility])     Physical exam - discussed healthy lifestyle, diet, exercise, preventative care, vaccinations, and addressed their concerns.  Handout given. See your eye doctor yearly for routine vision care. See your dentist yearly for routine dental care including hygiene visits twice yearly. She is s/p breast reduction surgery Discusses safe sex, condom use.  She is abstinent currently and does not wish to pursue birth control at this time.   Discussed her cardiac risks factors, premature CAD ,mixed dyslipidemia  Reviewed the recent labs she had done showing high total and LDL cholesterol, low HDL.  She will use lifestyle measures currently to try and improve weight and lipids.   Spent most of the time today discussed obesity, cardiac risk factors and other risks of obesity.  She will consider group exercise, lifestyle modifications, weight loss medications, and will plan to see her back in 3-4 months.  If not improving at that time, will consider adding statin for heart disease risk reduction as well as other modifications to get weight and lipids under better control. She declines influenza vaccine today. Follow-up pending labs  [facility] was seen  today for annual exam.  Diagnoses and all orders for this visit:  Routine general medical examination at a health care facility -     POCT Urinalysis DIP (Proadvantage Device) -     Comprehensive metabolic panel -     CBC -     TSH  Impaired fasting blood sugar  Family history of premature CAD  Mixed dyslipidemia  Class 2 severe obesity with serious comorbidity and body mass index (BMI) of 35.0 to 35.9 in adult, unspecified obesity type ([facility])  Herpes  Influenza vaccination declined  Class 2 severe obesity with serious comorbidity and body  mass index (BMI) of 36.0 to 36.9 in adult, unspecified obesity type ([place])

## 2017-05-31 NOTE — Patient Instructions (Signed)
Recommendations  Set goals for fitness  Considering joining a gym or group exercise  continue the ketogenic diet for now  Consider hiring a trainer  You can check insurance coverage for following weight loss medications  Saxenda, Qsymia, Contrave  F/u in 3-4 months

## 2017-06-03 ENCOUNTER — Encounter: Payer: Self-pay | Admitting: Medical

## 2017-08-26 DIAGNOSIS — Z3491 Encounter for supervision of normal pregnancy, unspecified, first trimester: Secondary | ICD-10-CM | POA: Diagnosis not present

## 2017-08-27 DIAGNOSIS — Z3491 Encounter for supervision of normal pregnancy, unspecified, first trimester: Secondary | ICD-10-CM | POA: Diagnosis not present

## 2017-09-06 ENCOUNTER — Ambulatory Visit: Payer: 59 | Admitting: Medical

## 2017-09-22 DIAGNOSIS — O99810 Abnormal glucose complicating pregnancy: Secondary | ICD-10-CM | POA: Diagnosis not present

## 2017-09-22 DIAGNOSIS — O3680X0 Pregnancy with inconclusive fetal viability, not applicable or unspecified: Secondary | ICD-10-CM | POA: Diagnosis not present

## 2017-09-22 DIAGNOSIS — Z3A12 12 weeks gestation of pregnancy: Secondary | ICD-10-CM | POA: Diagnosis not present

## 2017-09-22 DIAGNOSIS — Z3A01 Less than 8 weeks gestation of pregnancy: Secondary | ICD-10-CM | POA: Diagnosis not present

## 2017-09-22 DIAGNOSIS — N898 Other specified noninflammatory disorders of vagina: Secondary | ICD-10-CM | POA: Diagnosis not present

## 2017-09-22 DIAGNOSIS — O3680X9 Pregnancy with inconclusive fetal viability, other fetus: Secondary | ICD-10-CM | POA: Diagnosis not present

## 2017-10-06 DIAGNOSIS — R42 Dizziness and giddiness: Secondary | ICD-10-CM | POA: Diagnosis not present

## 2017-10-22 DIAGNOSIS — J00 Acute nasopharyngitis [common cold]: Secondary | ICD-10-CM | POA: Diagnosis not present

## 2017-10-22 DIAGNOSIS — Z3A16 16 weeks gestation of pregnancy: Secondary | ICD-10-CM | POA: Diagnosis not present

## 2017-11-09 DIAGNOSIS — D259 Leiomyoma of uterus, unspecified: Secondary | ICD-10-CM | POA: Diagnosis not present

## 2017-11-09 DIAGNOSIS — R102 Pelvic and perineal pain: Secondary | ICD-10-CM | POA: Diagnosis not present

## 2017-11-09 DIAGNOSIS — N898 Other specified noninflammatory disorders of vagina: Secondary | ICD-10-CM | POA: Diagnosis not present

## 2017-11-18 DIAGNOSIS — Z3A20 20 weeks gestation of pregnancy: Secondary | ICD-10-CM | POA: Diagnosis not present

## 2017-11-18 DIAGNOSIS — Z363 Encounter for antenatal screening for malformations: Secondary | ICD-10-CM | POA: Diagnosis not present

## 2017-11-19 DIAGNOSIS — O99212 Obesity complicating pregnancy, second trimester: Secondary | ICD-10-CM | POA: Diagnosis not present

## 2017-11-19 DIAGNOSIS — Z3A20 20 weeks gestation of pregnancy: Secondary | ICD-10-CM | POA: Diagnosis not present

## 2017-11-23 ENCOUNTER — Other Ambulatory Visit (HOSPITAL_COMMUNITY): Payer: Self-pay | Admitting: Obstetrics & Gynecology

## 2017-11-23 DIAGNOSIS — Z3689 Encounter for other specified antenatal screening: Secondary | ICD-10-CM

## 2017-12-02 ENCOUNTER — Encounter (HOSPITAL_COMMUNITY): Payer: Self-pay | Admitting: *Deleted

## 2017-12-03 ENCOUNTER — Ambulatory Visit (HOSPITAL_COMMUNITY)
Admission: RE | Admit: 2017-12-03 | Discharge: 2017-12-03 | Disposition: A | Discharge status: Home / Self Care | Payer: BLUE CROSS/BLUE SHIELD | Source: Ambulatory Visit | Attending: Obstetrics & Gynecology | Admitting: Obstetrics & Gynecology

## 2017-12-03 ENCOUNTER — Other Ambulatory Visit (HOSPITAL_COMMUNITY): Payer: Self-pay | Admitting: Obstetrics & Gynecology

## 2017-12-03 ENCOUNTER — Encounter (HOSPITAL_COMMUNITY): Payer: Self-pay

## 2017-12-03 DIAGNOSIS — D259 Leiomyoma of uterus, unspecified: Secondary | ICD-10-CM

## 2017-12-03 DIAGNOSIS — O2690 Pregnancy related conditions, unspecified, unspecified trimester: Secondary | ICD-10-CM

## 2017-12-03 DIAGNOSIS — O26872 Cervical shortening, second trimester: Secondary | ICD-10-CM | POA: Insufficient documentation

## 2017-12-03 DIAGNOSIS — O99212 Obesity complicating pregnancy, second trimester: Secondary | ICD-10-CM

## 2017-12-03 DIAGNOSIS — Z3A22 22 weeks gestation of pregnancy: Secondary | ICD-10-CM

## 2017-12-03 DIAGNOSIS — O3412 Maternal care for benign tumor of corpus uteri, second trimester: Secondary | ICD-10-CM

## 2017-12-03 DIAGNOSIS — Z3686 Encounter for antenatal screening for cervical length: Secondary | ICD-10-CM

## 2017-12-03 DIAGNOSIS — Z363 Encounter for antenatal screening for malformations: Secondary | ICD-10-CM

## 2017-12-03 DIAGNOSIS — Z3689 Encounter for other specified antenatal screening: Secondary | ICD-10-CM

## 2017-12-03 HISTORY — DX: Benign neoplasm of connective and other soft tissue, unspecified: D21.9

## 2017-12-06 ENCOUNTER — Other Ambulatory Visit (HOSPITAL_COMMUNITY): Payer: Self-pay | Admitting: *Deleted

## 2017-12-06 DIAGNOSIS — Z362 Encounter for other antenatal screening follow-up: Secondary | ICD-10-CM

## 2017-12-06 DIAGNOSIS — O26879 Cervical shortening, unspecified trimester: Secondary | ICD-10-CM

## 2017-12-17 ENCOUNTER — Encounter (HOSPITAL_COMMUNITY): Payer: Self-pay

## 2017-12-17 ENCOUNTER — Other Ambulatory Visit (HOSPITAL_COMMUNITY): Payer: Self-pay | Admitting: Maternal and Fetal Medicine

## 2017-12-17 ENCOUNTER — Observation Stay (HOSPITAL_COMMUNITY)
Admission: AD | Admit: 2017-12-17 | Discharge: 2017-12-18 | Disposition: A | Discharge status: Home / Self Care | Payer: BLUE CROSS/BLUE SHIELD | Source: Ambulatory Visit | Attending: Obstetrics & Gynecology | Admitting: Obstetrics & Gynecology

## 2017-12-17 ENCOUNTER — Ambulatory Visit (HOSPITAL_COMMUNITY)
Admission: RE | Admit: 2017-12-17 | Discharge: 2017-12-17 | Disposition: A | Discharge status: Home / Self Care | Payer: BLUE CROSS/BLUE SHIELD | Source: Ambulatory Visit | Attending: Obstetrics & Gynecology | Admitting: Obstetrics & Gynecology

## 2017-12-17 ENCOUNTER — Other Ambulatory Visit: Payer: Self-pay

## 2017-12-17 DIAGNOSIS — Z3686 Encounter for antenatal screening for cervical length: Secondary | ICD-10-CM

## 2017-12-17 DIAGNOSIS — Z3A24 24 weeks gestation of pregnancy: Secondary | ICD-10-CM

## 2017-12-17 DIAGNOSIS — E282 Polycystic ovarian syndrome: Secondary | ICD-10-CM

## 2017-12-17 DIAGNOSIS — D259 Leiomyoma of uterus, unspecified: Secondary | ICD-10-CM

## 2017-12-17 DIAGNOSIS — O99212 Obesity complicating pregnancy, second trimester: Secondary | ICD-10-CM

## 2017-12-17 DIAGNOSIS — O26872 Cervical shortening, second trimester: Principal | ICD-10-CM | POA: Diagnosis present

## 2017-12-17 DIAGNOSIS — O3412 Maternal care for benign tumor of corpus uteri, second trimester: Secondary | ICD-10-CM

## 2017-12-17 DIAGNOSIS — O26879 Cervical shortening, unspecified trimester: Secondary | ICD-10-CM

## 2017-12-17 LAB — PROTEIN / CREATININE RATIO, URINE
CREATININE, URINE: 204 mg/dL
PROTEIN CREATININE RATIO: 0.05 mg/mg{creat} (ref 0.00–0.15)
TOTAL PROTEIN, URINE: 10 mg/dL

## 2017-12-17 LAB — COMPREHENSIVE METABOLIC PANEL WITH GFR
ALT: 18 U/L (ref 14–54)
AST: 18 U/L (ref 15–41)
Albumin: 3 g/dL — ABNORMAL LOW (ref 3.5–5.0)
Alkaline Phosphatase: 88 U/L (ref 38–126)
Anion gap: 8 (ref 5–15)
BUN: 5 mg/dL — ABNORMAL LOW (ref 6–20)
CO2: 21 mmol/L — ABNORMAL LOW (ref 22–32)
Calcium: 9 mg/dL (ref 8.9–10.3)
Chloride: 105 mmol/L (ref 101–111)
Creatinine, Ser: 0.48 mg/dL (ref 0.44–1.00)
GFR calc Af Amer: >60 mL/min
GFR calc non Af Amer: >60 mL/min
Glucose, Bld: 94 mg/dL (ref 65–99)
Potassium: 3.7 mmol/L (ref 3.5–5.1)
Sodium: 134 mmol/L — ABNORMAL LOW (ref 135–145)
Total Bilirubin: 0.1 mg/dL — ABNORMAL LOW (ref 0.3–1.2)
Total Protein: 6.6 g/dL (ref 6.5–8.1)

## 2017-12-17 LAB — TYPE AND SCREEN
ABO/RH(D): O POS
Antibody Screen: NEGATIVE

## 2017-12-17 LAB — CBC
HCT: 33.2 % — ABNORMAL LOW (ref 36.0–46.0)
Hemoglobin: 10.7 g/dL — ABNORMAL LOW (ref 12.0–15.0)
MCH: 28.3 pg (ref 26.0–34.0)
MCHC: 32.2 g/dL (ref 30.0–36.0)
MCV: 87.8 fL (ref 78.0–100.0)
Platelets: 231 10*3/uL (ref 150–400)
RBC: 3.78 MIL/uL — ABNORMAL LOW (ref 3.87–5.11)
RDW: 14.8 % (ref 11.5–15.5)
WBC: 4.4 10*3/uL (ref 4.0–10.5)

## 2017-12-17 LAB — WET PREP, GENITAL
Clue Cells Wet Prep HPF POC: NONE SEEN
SPERM: NONE SEEN
TRICH WET PREP: NONE SEEN
Yeast Wet Prep HPF POC: NONE SEEN

## 2017-12-17 LAB — ABO/RH: ABO/RH(D): O POS

## 2017-12-17 LAB — OB RESULTS CONSOLE GBS: STREP GROUP B AG: NEGATIVE

## 2017-12-17 LAB — URIC ACID: Uric Acid, Serum: 3 mg/dL (ref 2.3–6.6)

## 2017-12-17 LAB — LACTATE DEHYDROGENASE: LDH: 130 U/L (ref 98–192)

## 2017-12-17 MED ORDER — ACETAMINOPHEN 325 MG PO TABS: 650.0000 mg | ORAL_TABLET | ORAL | Status: DC | PRN

## 2017-12-17 MED ORDER — PRENATAL MULTIVITAMIN CH: 1.0000 | ORAL_TABLET | Freq: Every day | ORAL | Status: DC

## 2017-12-17 MED ORDER — MAGNESIUM SULFATE 40 G IN LACTATED RINGERS - SIMPLE
1.0000 g/h | INTRAVENOUS | Status: AC
  Filled 2017-12-17: qty 500

## 2017-12-17 MED ORDER — INDOMETHACIN 25 MG PO CAPS
25.0000 mg | ORAL_CAPSULE | Freq: Four times a day (QID) | ORAL | Status: DC
  Administered 2017-12-17 – 2017-12-18 (×3): 25 mg via ORAL
  Filled 2017-12-17 (×7): qty 1

## 2017-12-17 MED ORDER — BETAMETHASONE SOD PHOS & ACET 6 (3-3) MG/ML IJ SUSP
12.0000 mg | INTRAMUSCULAR | Status: AC
  Administered 2017-12-17 – 2017-12-18 (×2): 12 mg via INTRAMUSCULAR
  Filled 2017-12-17 (×2): qty 2

## 2017-12-17 MED ORDER — CALCIUM CARBONATE ANTACID 500 MG PO CHEW: 2.0000 | CHEWABLE_TABLET | ORAL | Status: DC | PRN

## 2017-12-17 MED ORDER — LACTATED RINGERS IV SOLN
INTRAVENOUS | Status: DC
  Administered 2017-12-17 – 2017-12-18 (×2): via INTRAVENOUS

## 2017-12-17 MED ORDER — MAGNESIUM SULFATE BOLUS VIA INFUSION: 6.0000 g | Freq: Once | INTRAVENOUS | Status: DC

## 2017-12-17 MED ORDER — ZOLPIDEM TARTRATE 5 MG PO TABS: 5.0000 mg | ORAL_TABLET | Freq: Every evening | ORAL | Status: DC | PRN

## 2017-12-17 MED ORDER — PROGESTERONE MICRONIZED 200 MG PO CAPS
200.0000 mg | ORAL_CAPSULE | Freq: Every day | ORAL | Status: DC
  Administered 2017-12-17: 200 mg via VAGINAL
  Filled 2017-12-17: qty 1

## 2017-12-17 MED ORDER — MAGNESIUM SULFATE BOLUS VIA INFUSION
4.0000 g | Freq: Once | INTRAVENOUS | Status: AC
  Administered 2017-12-17: 4 g via INTRAVENOUS
  Filled 2017-12-17: qty 500

## 2017-12-17 MED ORDER — DOCUSATE SODIUM 100 MG PO CAPS
100.0000 mg | ORAL_CAPSULE | Freq: Every day | ORAL | Status: DC
  Administered 2017-12-17 – 2017-12-18 (×2): 100 mg via ORAL
  Filled 2017-12-17 (×2): qty 1

## 2017-12-17 MED ORDER — INDOMETHACIN 50 MG PO CAPS
50.0000 mg | ORAL_CAPSULE | Freq: Once | ORAL | Status: AC
  Administered 2017-12-17: 50 mg via ORAL
  Filled 2017-12-17: qty 1

## 2017-12-17 NOTE — H&P (Signed)
Anterpartum History and Physical  [name] is 32 yo [name] at 24 weeks 2 days  Sent by MFM Dr. Burnett Harry for shortened cervix.  Ultrasound showed cervix 1.4cm with dynamic changes and funnel.  Patient notes some intermittent  Cramping recently but she thought it was the uterine fibroids bothering her.  She denies vaginal bleeding, and denies loss of fluid. She notes having intermittent urinary incontinence and chills recently but didn't take her temperature.   Pregnancy complicated by Uterine leiomyoma Obesity  PMHX  Past Medical History:  Diagnosis Date  . Bacterial infection   . Family history of premature CAD    father  . Fibroids   . Herpes    genital  . Impaired fasting blood sugar   . Miscarriage 2014  . Mixed dyslipidemia   . Ovarian cyst   . Sexually transmitted disease (STD)    hx/o HPV, gonorrhea, chlamydia, genital herpes  . Syphilis 2010  . Yeast infection     PSURG HX  Past Surgical History:  Procedure Laterality Date  . BREAST REDUCTION SURGERY Bilateral 12/04/2015   Procedure: BILATERAL BREAST REDUCTION  WITH LIPOSUCTION;  Surgeon: Wallace Going, DO;  Location: [place];  Service: Plastics;  Laterality: Bilateral;    FAM HX  Family History  Problem Relation Age of Onset  . Heart disease Father 33       died MI  . Hypertension Maternal Grandmother   . Diabetes Maternal Grandmother   . Stroke Maternal Grandmother   . Heart disease Maternal Grandmother   . Hypertension Maternal Grandfather   . COPD Maternal Grandfather   . GER disease Mother   . Deep vein thrombosis Mother        with MVA  . COPD Paternal Grandfather     SOC HX  Social History   Socioeconomic History  . Marital status: Single    Spouse name: Not on file  . Number of children: Not on file  . Years of education: Not on file  . Highest education level: Not on file  Occupational History  . Not on file  Social Needs  . Financial resource strain: Not  on file  . Food insecurity:    Worry: Not on file    Inability: Not on file  . Transportation needs:    Medical: Not on file    Non-medical: Not on file  Tobacco Use  . Smoking status: Never Smoker  . Smokeless tobacco: Never Used  Substance and Sexual Activity  . Alcohol use: Never    Alcohol/week: 0.0 oz    Frequency: Never    Comment: occasionally  . Drug use: No  . Sexual activity: Yes    Birth control/protection: None  Lifestyle  . Physical activity:    Days per week: Not on file    Minutes per session: Not on file  . Stress: Not on file  Relationships  . Social connections:    Talks on phone: Not on file    Gets together: Not on file    Attends religious service: Not on file    Active member of club or organization: Not on file    Attends meetings of clubs or organizations: Not on file    Relationship status: Not on file  . Intimate partner violence:    Fear of current or ex partner: Not on file    Emotionally abused: Not on file    Physically abused: Not on file    Forced  sexual activity: Not on file  Other Topics Concern  . Not on file  Social History Narrative   Lives alone. No children.  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   Exercise  - some with walking. No significant other.  05/2017    ROS  Review of Systems - as per HPI   [name] SIUP breech position - normal anatomy   LABS CBC    Component Value Date/Time   WBC 3.1 (L) 05/31/2017 1048   RBC 4.93 05/31/2017 1048   HGB 13.3 05/31/2017 1048   HGB WILL FOLLOW 03/14/2016 0833   HCT 39.5 05/31/2017 1048   HCT WILL FOLLOW 03/14/2016 0833   PLT 333 05/31/2017 1048   PLT WILL FOLLOW 03/14/2016 0833   MCV 80.1 05/31/2017 1048   MCV WILL FOLLOW 03/14/2016 0833   MCH 27.0 05/31/2017 1048   MCHC 33.7 05/31/2017 1048   RDW 14.3 05/31/2017 1048   RDW WILL FOLLOW 03/14/2016 0833   LYMPHSABS WILL FOLLOW 03/14/2016 0833   MONOABS 0.5 12/29/2012 0232   EOSABS WILL FOLLOW 03/14/2016 0833   BASOSABS WILL  FOLLOW 03/14/2016 0833    Gen WDWN  IN NAD ABD Gravid soft NT GU cervix closed EXT no C/C/E  Assessment: SIUP at 24 weeks with short cervix  Plan: Admit to antepartum IV magnesium sulfate for CP / neuroprophylaxis Indomethacin for tocolysis Continuous toco Fetal tones q shift Betamethasone for 2 doses for FLM  Tabbetha Kutscher, [place]

## 2017-12-17 NOTE — Consult Note (Signed)
Neonatology Consult to Antenatal Patient:  I was asked by Dr. Alwyn Pea to see this patient in order to provide antenatal counseling due to short cervix.  Ms. Laura Cross was admitted today at 24 2/[redacted] weeks GA due to short cervix with some funneling, but no cervical dilatation or labor. She has obseity, fibroids, and the baby is in breech presentation. No diabetes. She is getting Betamethasone, neuroprotective Magnesium sulfate, and Indomethacin for tocolysis. She appears comfortable. This is her first baby, a female infant.  I spoke with the patient alone. She felt she had gotten good information about outcomes from her obstetric team, so I spoke briefly about what to expect if she were to deliver soon. I explained usual DR management, possible respiratory complications and need for support, IV access, feedings (mother desires breast feeding, which was encouraged), LOS, Mortality and Morbidity, and long term outcomes. She did not have any questions at this time. I offered a NICU tour to any interested family members and would be glad to come back if she has more questions later.  Thank you for asking me to see this patient.  Real Cons, MD Neonatologist  The total length of face-to-face or floor/unit time for this encounter was 20  minutes. Counseling and/or coordination of care was 12 minutes of the above.

## 2017-12-17 NOTE — ED Notes (Signed)
Report called to Apolonio Schneiders, Therapist, sports.  Pt to MAU via wheelchair for further evaluation.

## 2017-12-17 NOTE — Progress Notes (Signed)
Spoke to NICU MD on call tonight and gave update regarding pt condition. NICU consult will be ordered.

## 2017-12-17 NOTE — MAU Note (Signed)
Pt is a G2P0 at 24.2 weeks sent from office for observation of short cervix per [name] today.  Pt has h/o fibroids.  No ctx, LOF or VB noted.  Pt reports good FM.  No other OB concerns.

## 2017-12-18 DIAGNOSIS — Z3A24 24 weeks gestation of pregnancy: Secondary | ICD-10-CM | POA: Diagnosis not present

## 2017-12-18 DIAGNOSIS — O26872 Cervical shortening, second trimester: Secondary | ICD-10-CM | POA: Diagnosis not present

## 2017-12-18 LAB — CBC
HCT: 33.1 % — ABNORMAL LOW (ref 36.0–46.0)
Hemoglobin: 10.5 g/dL — ABNORMAL LOW (ref 12.0–15.0)
MCH: 27.9 pg (ref 26.0–34.0)
MCHC: 31.7 g/dL (ref 30.0–36.0)
MCV: 88 fL (ref 78.0–100.0)
Platelets: 234 10*3/uL (ref 150–400)
RBC: 3.76 MIL/uL — ABNORMAL LOW (ref 3.87–5.11)
RDW: 14.7 % (ref 11.5–15.5)
WBC: 5.1 10*3/uL (ref 4.0–10.5)

## 2017-12-18 MED ORDER — OXYCODONE HCL 5 MG PO TABS: 10.0000 mg | ORAL_TABLET | ORAL | Status: DC | PRN

## 2017-12-18 MED ORDER — SIMETHICONE 80 MG PO CHEW: 80.0000 mg | CHEWABLE_TABLET | ORAL | Status: DC | PRN

## 2017-12-18 MED ORDER — ONDANSETRON HCL 4 MG PO TABS: 4.0000 mg | ORAL_TABLET | ORAL | Status: DC | PRN

## 2017-12-18 MED ORDER — ACETAMINOPHEN 325 MG PO TABS: 650.0000 mg | ORAL_TABLET | ORAL | Status: DC | PRN

## 2017-12-18 MED ORDER — GUAIFENESIN ER 600 MG PO TB12
600.0000 mg | ORAL_TABLET | Freq: Two times a day (BID) | ORAL | Status: DC | PRN
Start: 2017-12-18 — End: 2017-12-18
  Administered 2017-12-18: 600 mg via ORAL
  Filled 2017-12-18 (×2): qty 1

## 2017-12-18 MED ORDER — DIPHENHYDRAMINE HCL 25 MG PO CAPS: 25.0000 mg | ORAL_CAPSULE | Freq: Four times a day (QID) | ORAL | Status: DC | PRN

## 2017-12-18 MED ORDER — DOCUSATE SODIUM 100 MG PO CAPS: 100.0000 mg | ORAL_CAPSULE | Freq: Two times a day (BID) | ORAL | Status: DC

## 2017-12-18 MED ORDER — BENZOCAINE-MENTHOL 20-0.5 % EX AERO: 1.0000 "application " | INHALATION_SPRAY | CUTANEOUS | Status: DC | PRN

## 2017-12-18 MED ORDER — PRENATAL MULTIVITAMIN CH: 1.0000 | ORAL_TABLET | Freq: Every day | ORAL | Status: DC

## 2017-12-18 MED ORDER — COCONUT OIL OIL: 1.0000 "application " | TOPICAL_OIL | Status: DC | PRN

## 2017-12-18 MED ORDER — SENNOSIDES-DOCUSATE SODIUM 8.6-50 MG PO TABS: 2.0000 | ORAL_TABLET | ORAL | Status: DC

## 2017-12-18 MED ORDER — ZOLPIDEM TARTRATE 5 MG PO TABS: 5.0000 mg | ORAL_TABLET | Freq: Every evening | ORAL | Status: DC | PRN

## 2017-12-18 MED ORDER — OXYCODONE HCL 5 MG PO TABS: 5.0000 mg | ORAL_TABLET | ORAL | Status: DC | PRN

## 2017-12-18 MED ORDER — WITCH HAZEL-GLYCERIN EX PADS: 1.0000 "application " | MEDICATED_PAD | CUTANEOUS | Status: DC | PRN

## 2017-12-18 MED ORDER — DIBUCAINE 1 % RE OINT: 1.0000 "application " | TOPICAL_OINTMENT | RECTAL | Status: DC | PRN

## 2017-12-18 MED ORDER — ONDANSETRON HCL 4 MG/2ML IJ SOLN: 4.0000 mg | INTRAMUSCULAR | Status: DC | PRN

## 2017-12-18 MED ORDER — TETANUS-DIPHTH-ACELL PERTUSSIS 5-2.5-18.5 LF-MCG/0.5 IM SUSP: 0.5000 mL | Freq: Once | INTRAMUSCULAR | Status: DC

## 2017-12-18 NOTE — Progress Notes (Signed)
Discharge instructions reviewed with pt. Discussed to pt about bed rest, sign and symptoms to report to the MD, upcoming appointments, and medications. Pt has no questions or concerns at this time. IV taken out and pt tolerated well. Pt walked out of hospital in stable condition.

## 2017-12-18 NOTE — Progress Notes (Signed)
Pt called c/o pinkish discharge on toilet paper after going to bathroom.   Instructed to save next time so I can see.   Will continue to monitor

## 2017-12-18 NOTE — Progress Notes (Signed)
Name: Laura Cross Medical Record Number:  [id number] Date of Birth: 1985/11/25 Date of Service: 12/18/2017  32 y.o. [name] [redacted]w[redacted]d HD#0 admitted for 24 WKS SHORT CERVIX.  Pt currently stable with no complaints. She denies contractions, no vaginal bleeding, no leaking of fluid. Reports good FM.  The patient's past medical history and prenatal records were reviewed.  Additional issues addressed and updated today: Patient Active Problem List   Diagnosis Date Noted  . Short cervix during pregnancy in second trimester 12/17/2017  . Influenza vaccination declined 05/31/2017  . Class 2 severe obesity with serious comorbidity and body mass index (BMI) of 36.0 to 36.9 in adult [facility]) 06/05/2015  . Genital herpes 06/05/2015  . History of syphilis 06/05/2015  . Routine general medical examination at a health care facility 06/05/2015  . Family history of premature CAD 06/05/2015  . Mixed dyslipidemia 06/05/2015  . Impaired fasting blood sugar 06/05/2015  . HPV (human papilloma virus) infection   . Herpes   . Ovarian cyst    Family History  Problem Relation Age of Onset  . Heart disease Father 77       died MI  . Hypertension Maternal Grandmother   . Diabetes Maternal Grandmother   . Stroke Maternal Grandmother   . Heart disease Maternal Grandmother   . Hypertension Maternal Grandfather   . COPD Maternal Grandfather   . GER disease Mother   . Deep vein thrombosis Mother        with MVA  . COPD Paternal Grandfather    Social History   Socioeconomic History  . Marital status: Single    Spouse name: Not on file  . Number of children: Not on file  . Years of education: Not on file  . Highest education level: Not on file  Occupational History  . Not on file  Social Needs  . Financial resource strain: Not on file  . Food insecurity:    Worry: Not on file    Inability: Not on file  . Transportation needs:    Medical: Not on file    Non-medical: Not on file  Tobacco Use  .  Smoking status: Never Smoker  . Smokeless tobacco: Never Used  Substance and Sexual Activity  . Alcohol use: Never    Alcohol/week: 0.0 oz    Frequency: Never    Comment: occasionally  . Drug use: No  . Sexual activity: Yes    Birth control/protection: None  Lifestyle  . Physical activity:    Days per week: Not on file    Minutes per session: Not on file  . Stress: Not on file  Relationships  . Social connections:    Talks on phone: Not on file    Gets together: Not on file    Attends religious service: Not on file    Active member of club or organization: Not on file    Attends meetings of clubs or organizations: Not on file    Relationship status: Not on file  Other Topics Concern  . Not on file  Social History Narrative   Lives alone. No children.  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   Exercise  - some with walking. No significant other.  05/2017   Vitals:   12/18/17 0510 12/18/17 0746  BP: (!) 110/55 109/71  Pulse: 85 85  Resp: 16 18  Temp: 97.9 F (36.6 C) 97.7 F (36.5 C)  SpO2: 98% 100%     Physical Examination:   Vitals:  12/18/17 0510 12/18/17 0746  BP: (!) 110/55 109/71  Pulse: 85 85  Resp: 16 18  Temp: 97.9 F (36.6 C) 97.7 F (36.5 C)  SpO2: 98% 100%   General appearance - alert, well appearing, and in no distress and oriented to person, place, and time Mental status - alert, oriented to person, place, and time, normal mood, behavior, speech, dress, motor activity, and thought processes  Abd  Soft, gravid, nontender Ex SCDs FHTs  150s, moderate variability accels no decels Toco  No ctx  Cervix: not evaluated  Results for orders placed or performed during the hospital encounter of 12/17/17 (from the past 24 hour(s))  Protein / creatinine ratio, urine     Status: None   Collection Time: 12/17/17  3:12 PM  Result Value Ref Range   Creatinine, Urine 204.00 mg/dL   Total Protein, Urine 10 mg/dL   Protein Creatinine Ratio 0.05 0.00 -  0.15 mg/mg[Cre]  CBC on admission     Status: Abnormal   Collection Time: 12/17/17  4:35 PM  Result Value Ref Range   WBC 4.4 4.0 - 10.5 K/uL   RBC 3.78 (L) 3.87 - 5.11 MIL/uL   Hemoglobin 10.7 (L) 12.0 - 15.0 g/dL   HCT 33.2 (L) 36.0 - 46.0 %   MCV 87.8 78.0 - 100.0 fL   MCH 28.3 26.0 - 34.0 pg   MCHC 32.2 30.0 - 36.0 g/dL   RDW 14.8 11.5 - 15.5 %   Platelets 231 150 - 400 K/uL  Comprehensive metabolic panel     Status: Abnormal   Collection Time: 12/17/17  4:35 PM  Result Value Ref Range   Sodium 134 (L) 135 - 145 mmol/L   Potassium 3.7 3.5 - 5.1 mmol/L   Chloride 105 101 - 111 mmol/L   CO2 21 (L) 22 - 32 mmol/L   Glucose, Bld 94 65 - 99 mg/dL   BUN 5 (L) 6 - 20 mg/dL   Creatinine, Ser 0.48 0.44 - 1.00 mg/dL   Calcium 9.0 8.9 - 10.3 mg/dL   Total Protein 6.6 6.5 - 8.1 g/dL   Albumin 3.0 (L) 3.5 - 5.0 g/dL   AST 18 15 - 41 U/L   ALT 18 14 - 54 U/L   Alkaline Phosphatase 88 38 - 126 U/L   Total Bilirubin 0.1 (L) 0.3 - 1.2 mg/dL   GFR calc non Af Amer >60 >60 mL/min   GFR calc Af Amer >60 >60 mL/min   Anion gap 8 5 - 15  Uric acid     Status: None   Collection Time: 12/17/17  4:35 PM  Result Value Ref Range   Uric Acid, Serum 3.0 2.3 - 6.6 mg/dL  Lactate dehydrogenase     Status: None   Collection Time: 12/17/17  4:35 PM  Result Value Ref Range   LDH 130 98 - 192 U/L  Type and screen [place]     Status: None   Collection Time: 12/17/17  4:38 PM  Result Value Ref Range   ABO/RH(D) O POS    Antibody Screen NEG    Sample Expiration      12/20/2017 Performed at [facility], [address], [place], [place] [address]   ABO/Rh     Status: None   Collection Time: 12/17/17  4:38 PM  Result Value Ref Range   ABO/RH(D)      O POS Performed at [facility], [address] [place],  [address]   Wet prep,  genital     Status: Abnormal   Collection Time: 12/17/17  5:12 PM  Result Value Ref Range   Yeast Wet Prep HPF POC NONE SEEN  NONE SEEN   Trich, Wet Prep NONE SEEN NONE SEEN   Clue Cells Wet Prep HPF POC NONE SEEN NONE SEEN   WBC, Wet Prep HPF POC FEW (A) NONE SEEN   Sperm NONE SEEN   CBC     Status: Abnormal   Collection Time: 12/18/17  5:15 AM  Result Value Ref Range   WBC 5.1 4.0 - 10.5 K/uL   RBC 3.76 (L) 3.87 - 5.11 MIL/uL   Hemoglobin 10.5 (L) 12.0 - 15.0 g/dL   HCT 33.1 (L) 36.0 - 46.0 %   MCV 88.0 78.0 - 100.0 fL   MCH 27.9 26.0 - 34.0 pg   MCHC 31.7 30.0 - 36.0 g/dL   RDW 14.7 11.5 - 15.5 %   Platelets 234 150 - 400 K/uL    Assessment:  HD#0  [redacted]w[redacted]d with shortened cervix / funnel.  Plan: Patient can be discharged home after second dose of betamethasone today Discussed with patient bedrest at home and preterm labor precautions.  Baby is breech so cesarean section for delivery.   Laura Cross Laura Cross

## 2017-12-18 NOTE — Progress Notes (Signed)
Hospital day # 0 pregnancy at [redacted]w[redacted]d  S: well, reports good fetal activity      Contractions:none      Vaginal bleeding:none        Vaginal discharge: no significant change  O: BP (!) 110/55   Pulse 85   Temp 97.9 F (36.6 C) (Oral)   Resp 16   Ht 5\' 1"  (1.549 m)   Wt 201 lb (91.2 kg)   LMP 06/30/2017   SpO2 98%   BMI 37.98 kg/m       Fetal tracings:reviewed and reassuring      Uterus consistent with 24 weeks and non-tender      Extremities: no significant edema and no signs of DVT  A: [redacted]w[redacted]d with shortened cervix and funnelling     unchanged     CP Prophylaxis complete     NICU consult completed P: continue current plan of care      2nd shot Betamethasone today Bertram Gala Dove [place]  CNM 12/18/2017 6:54 AM

## 2017-12-18 NOTE — Discharge Summary (Signed)
Obstetric / Antepartum Discharge Summary Reason for Admission: short cervix, PTL Prenatal Procedures: NST and ultrasound Intrapartum Procedures: n/a Postpartum Procedures: n/a still pregnant Complications-Operative and Postpartum: none Hemoglobin  Date Value Ref Range Status  12/18/2017 10.5 (L) 12.0 - 15.0 g/dL Final  03/14/2016 WILL FOLLOW  Preliminary   HCT  Date Value Ref Range Status  12/18/2017 33.1 (L) 36.0 - 46.0 % Final   Hematocrit  Date Value Ref Range Status  03/14/2016 WILL FOLLOW  Preliminary    Physical Exam:  General: alert, cooperative and no distress Abdomen: Soft, gravid, NT NST: baseline 150's reactive Toco: no ctx Cervix: not evaluated DVT Evaluation: No evidence of DVT seen on physical exam.  Discharge Diagnoses: Premature labor / short cervix  Discharge Information: Date: 12/18/2017 Activity: pelvic rest Diet: routine Medications: PNV and vaginal progesterone Condition: stable Instructions: bedrest at home Discharge to: home   Newborn Data: Not born yet  Caffie Damme 12/18/2017, 5:02 PM

## 2017-12-19 LAB — CULTURE, BETA STREP (GROUP B ONLY)

## 2017-12-19 LAB — URINE CULTURE

## 2017-12-21 DIAGNOSIS — O3432 Maternal care for cervical incompetence, second trimester: Secondary | ICD-10-CM | POA: Diagnosis not present

## 2017-12-21 DIAGNOSIS — Z3A24 24 weeks gestation of pregnancy: Secondary | ICD-10-CM | POA: Diagnosis not present

## 2017-12-23 ENCOUNTER — Ambulatory Visit (HOSPITAL_COMMUNITY)
Admission: RE | Admit: 2017-12-23 | Discharge: 2017-12-23 | Disposition: A | Discharge status: Home / Self Care | Payer: BLUE CROSS/BLUE SHIELD | Source: Ambulatory Visit | Attending: Obstetrics & Gynecology | Admitting: Obstetrics & Gynecology

## 2017-12-23 ENCOUNTER — Encounter (HOSPITAL_COMMUNITY): Payer: Self-pay

## 2017-12-23 DIAGNOSIS — Z3686 Encounter for antenatal screening for cervical length: Secondary | ICD-10-CM | POA: Insufficient documentation

## 2017-12-23 DIAGNOSIS — D259 Leiomyoma of uterus, unspecified: Secondary | ICD-10-CM | POA: Insufficient documentation

## 2017-12-23 DIAGNOSIS — E669 Obesity, unspecified: Secondary | ICD-10-CM | POA: Diagnosis not present

## 2017-12-23 DIAGNOSIS — O99282 Endocrine, nutritional and metabolic diseases complicating pregnancy, second trimester: Secondary | ICD-10-CM | POA: Diagnosis not present

## 2017-12-23 DIAGNOSIS — O26872 Cervical shortening, second trimester: Secondary | ICD-10-CM | POA: Diagnosis not present

## 2017-12-23 DIAGNOSIS — O321XX0 Maternal care for breech presentation, not applicable or unspecified: Secondary | ICD-10-CM | POA: Diagnosis not present

## 2017-12-23 DIAGNOSIS — O3412 Maternal care for benign tumor of corpus uteri, second trimester: Secondary | ICD-10-CM | POA: Diagnosis not present

## 2017-12-23 DIAGNOSIS — Z362 Encounter for other antenatal screening follow-up: Secondary | ICD-10-CM

## 2017-12-23 DIAGNOSIS — E282 Polycystic ovarian syndrome: Secondary | ICD-10-CM | POA: Insufficient documentation

## 2017-12-23 DIAGNOSIS — Z3A25 25 weeks gestation of pregnancy: Secondary | ICD-10-CM | POA: Insufficient documentation

## 2017-12-23 DIAGNOSIS — O26879 Cervical shortening, unspecified trimester: Secondary | ICD-10-CM

## 2017-12-23 DIAGNOSIS — O99212 Obesity complicating pregnancy, second trimester: Secondary | ICD-10-CM | POA: Insufficient documentation

## 2017-12-24 ENCOUNTER — Other Ambulatory Visit (HOSPITAL_COMMUNITY): Payer: Self-pay | Admitting: *Deleted

## 2017-12-24 DIAGNOSIS — O3410 Maternal care for benign tumor of corpus uteri, unspecified trimester: Principal | ICD-10-CM

## 2017-12-24 DIAGNOSIS — D259 Leiomyoma of uterus, unspecified: Secondary | ICD-10-CM

## 2017-12-27 ENCOUNTER — Inpatient Hospital Stay (HOSPITAL_COMMUNITY): Payer: BLUE CROSS/BLUE SHIELD

## 2017-12-27 ENCOUNTER — Inpatient Hospital Stay (HOSPITAL_COMMUNITY)
Admission: AD | Admit: 2017-12-27 | Discharge: 2018-01-07 | DRG: 786 | Disposition: A | Discharge status: Home / Self Care | Payer: BLUE CROSS/BLUE SHIELD | Attending: Obstetrics & Gynecology | Admitting: Obstetrics & Gynecology

## 2017-12-27 ENCOUNTER — Other Ambulatory Visit: Payer: Self-pay

## 2017-12-27 ENCOUNTER — Telehealth: Payer: Self-pay | Admitting: *Deleted

## 2017-12-27 ENCOUNTER — Encounter (HOSPITAL_COMMUNITY): Payer: Self-pay

## 2017-12-27 DIAGNOSIS — O26872 Cervical shortening, second trimester: Secondary | ICD-10-CM | POA: Diagnosis present

## 2017-12-27 DIAGNOSIS — O99214 Obesity complicating childbirth: Secondary | ICD-10-CM | POA: Diagnosis not present

## 2017-12-27 DIAGNOSIS — Z3A28 28 weeks gestation of pregnancy: Secondary | ICD-10-CM | POA: Diagnosis not present

## 2017-12-27 DIAGNOSIS — E669 Obesity, unspecified: Secondary | ICD-10-CM | POA: Diagnosis not present

## 2017-12-27 DIAGNOSIS — O26879 Cervical shortening, unspecified trimester: Secondary | ICD-10-CM

## 2017-12-27 DIAGNOSIS — Z3A25 25 weeks gestation of pregnancy: Secondary | ICD-10-CM | POA: Diagnosis not present

## 2017-12-27 DIAGNOSIS — O3432 Maternal care for cervical incompetence, second trimester: Secondary | ICD-10-CM | POA: Diagnosis not present

## 2017-12-27 DIAGNOSIS — O99213 Obesity complicating pregnancy, third trimester: Secondary | ICD-10-CM | POA: Diagnosis not present

## 2017-12-27 DIAGNOSIS — Z3A27 27 weeks gestation of pregnancy: Secondary | ICD-10-CM

## 2017-12-27 DIAGNOSIS — Z3A30 30 weeks gestation of pregnancy: Secondary | ICD-10-CM | POA: Diagnosis not present

## 2017-12-27 DIAGNOSIS — O42912 Preterm premature rupture of membranes, unspecified as to length of time between rupture and onset of labor, second trimester: Secondary | ICD-10-CM | POA: Diagnosis not present

## 2017-12-27 DIAGNOSIS — O328XX0 Maternal care for other malpresentation of fetus, not applicable or unspecified: Secondary | ICD-10-CM | POA: Diagnosis not present

## 2017-12-27 DIAGNOSIS — O3412 Maternal care for benign tumor of corpus uteri, second trimester: Secondary | ICD-10-CM | POA: Diagnosis not present

## 2017-12-27 DIAGNOSIS — O3433 Maternal care for cervical incompetence, third trimester: Secondary | ICD-10-CM | POA: Diagnosis not present

## 2017-12-27 DIAGNOSIS — D259 Leiomyoma of uterus, unspecified: Secondary | ICD-10-CM | POA: Diagnosis present

## 2017-12-27 DIAGNOSIS — N883 Incompetence of cervix uteri: Secondary | ICD-10-CM

## 2017-12-27 DIAGNOSIS — O26873 Cervical shortening, third trimester: Secondary | ICD-10-CM | POA: Diagnosis not present

## 2017-12-27 DIAGNOSIS — Z3A26 26 weeks gestation of pregnancy: Secondary | ICD-10-CM

## 2017-12-27 DIAGNOSIS — O411230 Chorioamnionitis, third trimester, not applicable or unspecified: Secondary | ICD-10-CM | POA: Diagnosis not present

## 2017-12-27 DIAGNOSIS — O9081 Anemia of the puerperium: Secondary | ICD-10-CM | POA: Diagnosis not present

## 2017-12-27 DIAGNOSIS — O288 Other abnormal findings on antenatal screening of mother: Secondary | ICD-10-CM | POA: Diagnosis not present

## 2017-12-27 DIAGNOSIS — O42113 Preterm premature rupture of membranes, onset of labor more than 24 hours following rupture, third trimester: Secondary | ICD-10-CM | POA: Diagnosis not present

## 2017-12-27 DIAGNOSIS — Z3A31 31 weeks gestation of pregnancy: Secondary | ICD-10-CM | POA: Diagnosis not present

## 2017-12-27 DIAGNOSIS — O3413 Maternal care for benign tumor of corpus uteri, third trimester: Secondary | ICD-10-CM | POA: Diagnosis not present

## 2017-12-27 DIAGNOSIS — O321XX0 Maternal care for breech presentation, not applicable or unspecified: Secondary | ICD-10-CM | POA: Diagnosis not present

## 2017-12-27 DIAGNOSIS — Z3A32 32 weeks gestation of pregnancy: Secondary | ICD-10-CM | POA: Diagnosis not present

## 2017-12-27 DIAGNOSIS — Z3A29 29 weeks gestation of pregnancy: Secondary | ICD-10-CM | POA: Diagnosis not present

## 2017-12-27 DIAGNOSIS — Z3A36 36 weeks gestation of pregnancy: Secondary | ICD-10-CM | POA: Diagnosis not present

## 2017-12-27 DIAGNOSIS — O289 Unspecified abnormal findings on antenatal screening of mother: Secondary | ICD-10-CM

## 2017-12-27 LAB — CBC
HCT: 32.7 % — ABNORMAL LOW (ref 36.0–46.0)
Hemoglobin: 10.6 g/dL — ABNORMAL LOW (ref 12.0–15.0)
MCH: 28.4 pg (ref 26.0–34.0)
MCHC: 32.4 g/dL (ref 30.0–36.0)
MCV: 87.7 fL (ref 78.0–100.0)
Platelets: 242 10*3/uL (ref 150–400)
RBC: 3.73 MIL/uL — AB (ref 3.87–5.11)
RDW: 15.1 % (ref 11.5–15.5)
WBC: 8.3 10*3/uL (ref 4.0–10.5)

## 2017-12-27 LAB — URINALYSIS, ROUTINE W REFLEX MICROSCOPIC
Bilirubin Urine: NEGATIVE
Bilirubin Urine: NEGATIVE
GLUCOSE, UA: NEGATIVE mg/dL
GLUCOSE, UA: NEGATIVE mg/dL
Hgb urine dipstick: NEGATIVE
Hgb urine dipstick: NEGATIVE
KETONES UR: NEGATIVE mg/dL
Ketones, ur: NEGATIVE mg/dL
LEUKOCYTES UA: NEGATIVE
LEUKOCYTES UA: NEGATIVE
NITRITE: NEGATIVE
Nitrite: NEGATIVE
PH: 6 (ref 5.0–8.0)
PH: 6 (ref 5.0–8.0)
PROTEIN: NEGATIVE mg/dL
Protein, ur: NEGATIVE mg/dL
SPECIFIC GRAVITY, URINE: 1.023 (ref 1.005–1.030)
Specific Gravity, Urine: 1.01 (ref 1.005–1.030)

## 2017-12-27 LAB — TYPE AND SCREEN
ABO/RH(D): O POS
Antibody Screen: NEGATIVE

## 2017-12-27 MED ORDER — ACETAMINOPHEN 325 MG PO TABS
650.0000 mg | ORAL_TABLET | ORAL | Status: DC | PRN
  Administered 2017-12-27 – 2018-01-05 (×6): 650 mg via ORAL
  Filled 2017-12-27 (×6): qty 2

## 2017-12-27 MED ORDER — FAMOTIDINE IN NACL 20-0.9 MG/50ML-% IV SOLN: 20.0000 mg | Freq: Once | INTRAVENOUS | Status: DC

## 2017-12-27 MED ORDER — NIFEDIPINE 10 MG PO CAPS
20.0000 mg | ORAL_CAPSULE | Freq: Once | ORAL | Status: AC
  Administered 2017-12-27: 20 mg via ORAL
  Filled 2017-12-27: qty 2

## 2017-12-27 MED ORDER — LACTATED RINGERS IV SOLN
INTRAVENOUS | Status: DC
  Administered 2017-12-27 – 2018-01-05 (×23): via INTRAVENOUS

## 2017-12-27 MED ORDER — DOCUSATE SODIUM 100 MG PO CAPS
100.0000 mg | ORAL_CAPSULE | Freq: Every day | ORAL | Status: DC
  Administered 2017-12-27 – 2018-01-06 (×10): 100 mg via ORAL
  Filled 2017-12-27 (×12): qty 1

## 2017-12-27 MED ORDER — NIFEDIPINE 10 MG PO CAPS
10.0000 mg | ORAL_CAPSULE | Freq: Four times a day (QID) | ORAL | Status: DC
  Administered 2017-12-27 – 2018-01-07 (×43): 10 mg via ORAL
  Filled 2017-12-27 (×44): qty 1

## 2017-12-27 MED ORDER — PRENATAL MULTIVITAMIN CH
1.0000 | ORAL_TABLET | Freq: Every day | ORAL | Status: DC
  Administered 2017-12-28 – 2018-01-06 (×10): 1 via ORAL
  Filled 2017-12-27 (×12): qty 1

## 2017-12-27 MED ORDER — ZOLPIDEM TARTRATE 5 MG PO TABS
5.0000 mg | ORAL_TABLET | Freq: Every evening | ORAL | Status: DC | PRN
  Administered 2018-01-05: 5 mg via ORAL
  Filled 2017-12-27: qty 1

## 2017-12-27 MED ORDER — SOD CITRATE-CITRIC ACID 500-334 MG/5ML PO SOLN: 30.0000 mL | Freq: Once | ORAL | Status: DC

## 2017-12-27 MED ORDER — CALCIUM CARBONATE ANTACID 500 MG PO CHEW
2.0000 | CHEWABLE_TABLET | ORAL | Status: DC | PRN
  Administered 2018-01-03 – 2018-01-05 (×2): 400 mg via ORAL
  Filled 2017-12-27 (×2): qty 2

## 2017-12-27 NOTE — H&P (Signed)
Anterpartum History and Physical  [name] is a 32 year old [name] at 25 weeks 5 days who presents c/o regular painful contractions. She was recently admitted last week for steroids for fetal lung maturity as during routine MFM ultrasound it was noted that she had a short cervix 20mm with funneling.  She presently denies vaginal  Bleeding, no leaking of fluid. She notes good fetal movement.   Pt presents with 25WKS, CRAMPING  Pregnancy complicated by Short cervix - steroid complete Uterine fibroids  PMHX  Past Medical History:  Diagnosis Date  . Bacterial infection   . Family history of premature CAD    father  . Fibroids   . Herpes    genital  . Impaired fasting blood sugar   . Miscarriage 2014  . Mixed dyslipidemia   . Ovarian cyst   . Sexually transmitted disease (STD)    hx/o HPV, gonorrhea, chlamydia, genital herpes  . Syphilis 2010  . Yeast infection     PSURG HX  Past Surgical History:  Procedure Laterality Date  . BREAST REDUCTION SURGERY Bilateral 12/04/2015   Procedure: BILATERAL BREAST REDUCTION  WITH LIPOSUCTION;  Surgeon: Wallace Going, DO;  Location: [facility];  Service: Plastics;  Laterality: Bilateral;    FAM HX  Family History  Problem Relation Age of Onset  . Heart disease Father 28       died MI  . Hypertension Maternal Grandmother   . Diabetes Maternal Grandmother   . Stroke Maternal Grandmother   . Heart disease Maternal Grandmother   . Hypertension Maternal Grandfather   . COPD Maternal Grandfather   . GER disease Mother   . Deep vein thrombosis Mother        with MVA  . COPD Paternal Grandfather     SOC HX  Social History   Socioeconomic History  . Marital status: Single    Spouse name: Not on file  . Number of children: Not on file  . Years of education: Not on file  . Highest education level: Not on file  Occupational History  . Not on file  Social Needs  . Financial resource strain: Not on  file  . Food insecurity:    Worry: Not on file    Inability: Not on file  . Transportation needs:    Medical: Not on file    Non-medical: Not on file  Tobacco Use  . Smoking status: Never Smoker  . Smokeless tobacco: Never Used  Substance and Sexual Activity  . Alcohol use: Never    Alcohol/week: 0.0 oz    Frequency: Never    Comment: occasionally  . Drug use: No  . Sexual activity: Yes    Birth control/protection: None  Lifestyle  . Physical activity:    Days per week: Not on file    Minutes per session: Not on file  . Stress: Not on file  Relationships  . Social connections:    Talks on phone: Not on file    Gets together: Not on file    Attends religious service: Not on file    Active member of club or organization: Not on file    Attends meetings of clubs or organizations: Not on file    Relationship status: Not on file  . Intimate partner violence:    Fear of current or ex partner: Not on file    Emotionally abused: Not on file    Physically abused: Not on file  Forced sexual activity: Not on file  Other Topics Concern  . Not on file  Social History Narrative   Lives alone. No children.  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   Exercise  - some with walking. No significant other.  05/2017    ROS  Review of Systems - as per HPI  [name] SIUP 12/23/17:  Breech SIUP EFW 26% 649 grams  LABS CBC    Component Value Date/Time   WBC 5.1 12/18/2017 0515   RBC 3.76 (L) 12/18/2017 0515   HGB 10.5 (L) 12/18/2017 0515   HGB WILL FOLLOW 03/14/2016 0833   HCT 33.1 (L) 12/18/2017 0515   HCT WILL FOLLOW 03/14/2016 0833   PLT 234 12/18/2017 0515   PLT WILL FOLLOW 03/14/2016 0833   MCV 88.0 12/18/2017 0515   MCV WILL FOLLOW 03/14/2016 0833   MCH 27.9 12/18/2017 0515   MCHC 31.7 12/18/2017 0515   RDW 14.7 12/18/2017 0515   RDW WILL FOLLOW 03/14/2016 0833   LYMPHSABS WILL FOLLOW 03/14/2016 0833   MONOABS 0.5 12/29/2012 0232   EOSABS WILL FOLLOW 03/14/2016 0833    BASOSABS WILL FOLLOW 03/14/2016 0833    Gen WDWN  IN NAD ABD Gravid soft NT GU closed, 70%, 0 station   Assessment: SIUP [redacted]w[redacted]d with preterm contractions  Plan: Admit to antepartum for observation Continuous toco, intermittent fetal monitoring Nifedipine for tocolysis MFM [name]  Glendoris Nodarse, [place]

## 2017-12-27 NOTE — Care Management (Signed)
Received call informing UR/CM that admission orders were missing. Made unit RN aware (via Conservation officer, historic buildings) as well as , Pinn, MD that admit to IP would need to be written if this is the status she would like the pt to be in. MD assures me this order has already been written, but she will reassess and correct if needed

## 2017-12-27 NOTE — MAU Note (Addendum)
Pt states she started feeling ctx last night, feels about 3-4 an hour continuing today. No LOF or bleeding + FM.  Pt states she has short cervix, taking progesterone. Has had BMZ.

## 2017-12-28 ENCOUNTER — Inpatient Hospital Stay (HOSPITAL_COMMUNITY): Payer: BLUE CROSS/BLUE SHIELD

## 2017-12-28 MED ORDER — IBUPROFEN 600 MG PO TABS
600.0000 mg | ORAL_TABLET | Freq: Four times a day (QID) | ORAL | Status: AC
  Administered 2017-12-28 – 2017-12-31 (×12): 600 mg via ORAL
  Filled 2017-12-28 (×12): qty 1

## 2017-12-28 MED ORDER — PROGESTERONE MICRONIZED 200 MG PO CAPS
200.0000 mg | ORAL_CAPSULE | Freq: Every day | ORAL | Status: DC
  Administered 2017-12-28 – 2018-01-06 (×10): 200 mg via VAGINAL
  Filled 2017-12-28 (×10): qty 1

## 2017-12-28 MED ORDER — NIFEDIPINE 10 MG PO CAPS
10.0000 mg | ORAL_CAPSULE | Freq: Once | ORAL | Status: AC
  Administered 2017-12-28: 10 mg via ORAL
  Filled 2017-12-28: qty 1

## 2017-12-28 MED ORDER — PROGESTERONE 200 MG VA SUPP
200.0000 mg | Freq: Every day | VAGINAL | Status: DC
  Filled 2017-12-28: qty 1

## 2017-12-28 MED ORDER — IBUPROFEN 600 MG PO TABS
600.0000 mg | ORAL_TABLET | Freq: Once | ORAL | Status: AC
  Administered 2017-12-28: 600 mg via ORAL
  Filled 2017-12-28: qty 1

## 2017-12-28 NOTE — Consult Note (Signed)
Laura Cross is well-known to MFM due to her uterine fibroids and short cervical length. She was admitted yesterday after TVCL showed dilation through to external os. This was a narrow and "V" shaped funnel. She has continued with cramping since admission despite treatment with nifedipine. After reviewing her [name] images and hospital course I would recommend starting ibuprofen 600mg  q 6 hours x 12 doses, a total of 3 days of therapy. I would continue her vaginal progesterone. I would repeat her [name] This Thursday, 48 hours after intiation of her ibuprofen. Due to her advanced gestational age she is not a candidate for cerclage.

## 2017-12-28 NOTE — Progress Notes (Addendum)
AMENDED NOTE FROM 12/28/17:  (Initial note from 12/28/17 reported results from 5/16 [name], not 12/27/17 [place]--note corrected with most updated [name] results documented).  Hospital day # 1 pregnancy at [redacted]w[redacted]d--PTL and short cervix, s/p betamethasone course 5/10-5/11, known fibroids.  S:  Still noting same degree of cramping as upon admission.      Denies leaking or bleeding, reports positive FM.      Maintaining bedrest, with use of bedpan      Doesn't feel Procardia has much impact on cramping.  O: BP 113/71 (BP Location: Left Arm)   Pulse 93   Temp 98.5 F (36.9 C) (Oral)   Resp 18   LMP 06/30/2017   SpO2 99%       Fetal tracings:  Category 1 on TID tracing      Contractions:  Uterine irritability, with 1-2 more defined UCs per hour per patient report, on continuous toco.      Uterus non-tender      Extremities: SCDs in place, and no significant edema and no signs of DVT          Labs:   Results for orders placed or performed during the hospital encounter of 12/27/17 (from the past 24 hour(s))  Urinalysis, Routine w reflex microscopic     Status: None   Collection Time: 12/27/17 10:26 AM  Result Value Ref Range   Color, Urine YELLOW YELLOW   APPearance CLEAR CLEAR   Specific Gravity, Urine 1.023 1.005 - 1.030   pH 6.0 5.0 - 8.0   Glucose, UA NEGATIVE NEGATIVE mg/dL   Hgb urine dipstick NEGATIVE NEGATIVE   Bilirubin Urine NEGATIVE NEGATIVE   Ketones, ur NEGATIVE NEGATIVE mg/dL   Protein, ur NEGATIVE NEGATIVE mg/dL   Nitrite NEGATIVE NEGATIVE   Leukocytes, UA NEGATIVE NEGATIVE  Type and screen [place]     Status: None   Collection Time: 12/27/17 12:19 PM  Result Value Ref Range   ABO/RH(D) O POS    Antibody Screen NEG    Sample Expiration      12/30/2017 Performed at [facility], [address] [place], [place] [address]   CBC on admission     Status: Abnormal   Collection Time: 12/27/17 12:19 PM  Result Value Ref Range   WBC 8.3 4.0 - 10.5 K/uL   RBC 3.73 (L) 3.87 - 5.11 MIL/uL   Hemoglobin 10.6 (L) 12.0 - 15.0 g/dL   HCT 32.7 (L) 36.0 - 46.0 %   MCV 87.7 78.0 - 100.0 fL   MCH 28.4 26.0 - 34.0 pg   MCHC 32.4 30.0 - 36.0 g/dL   RDW 15.1 11.5 - 15.5 %   Platelets 242 150 - 400 K/uL  Urinalysis, Routine w reflex microscopic     Status: None   Collection Time: 12/27/17  1:55 PM  Result Value Ref Range   Color, Urine YELLOW YELLOW   APPearance CLEAR CLEAR   Specific Gravity, Urine 1.010 1.005 - 1.030   pH 6.0 5.0 - 8.0   Glucose, UA NEGATIVE NEGATIVE mg/dL   Hgb urine dipstick NEGATIVE NEGATIVE   Bilirubin Urine NEGATIVE NEGATIVE   Ketones, ur NEGATIVE NEGATIVE mg/dL   Protein, ur NEGATIVE NEGATIVE mg/dL   Nitrite NEGATIVE NEGATIVE   Leukocytes, UA NEGATIVE NEGATIVE         Meds:  . docusate sodium  100 mg Oral Daily  . NIFEdipine  10 mg Oral Q6H  . prenatal multivitamin  1 tablet Oral Q1200   MFM [name]  12/27/17:  Breech, footling/Funic presentation (cord is notably funneled  through the internal os and in the cervical canal)  Placenta is posterior and above cervical os  Normal amniotic fluid volume.  Normal interval fetal anatomy.  Cervix is open/funneled to the external os (no measurable  closed cervical length)   A: [redacted]w[redacted]d with PTL, no measurable closed cervical length and funneled, funic presentation, fibroids     Stable  P: Continue current plan of care      Upcoming tests/treatments:  Continue Procardia for tocolysis, vaginal progesterone (needed ordering for inpatient use--done).      MDs will follow--will consult with Dr. Mancel Bale for plan of care.  Donnel Saxon CNM, [place] 12/28/2017 7:09 AM  0920 Called by RN asking if she can give the pt ibuprofen d/t c/o cramping.  I asked for her to give another 10mg  po dose of procardia and if no improvement, may give 600mg  of ibuprofen.  I also requested an order for MFM consult for recs on longer term mgmt.    [place]orman by RN about 1510 when pt had prolonged decel.  She  changed position to pt's left side from her back and fetal heart rate recovered.  VE done and cervix closed/80-90%/soft and foot felt kicking against cervix.  Will place in trendelenburg.  MFM consult appreciated.  Will start ibuprofen q6 x 12 doses and repeat u/s on Thursday.  Cont vaginal progesterone as well.

## 2017-12-29 NOTE — Progress Notes (Signed)
Pt without complaints.  No leakage of fluid or VB.  Good FM  BP 132/74 (BP Location: Left Arm)   Pulse 82   Temp 97.9 F (36.6 C) (Oral)   Resp 18   Ht 5\' 1"  (1.549 m)   Wt 89.9 kg (198 lb 4 oz)   LMP 06/30/2017   SpO2 99%   BMI 37.46 kg/m   FHTS Baseline: 130 bpm and Variability: Good {> 6 bpm)  Toco none  Pt in NAD CV RRR Lungs CTAB abd  Gravid soft and NT GU no vb EXt no calf tenderness Results for orders placed or performed during the hospital encounter of 12/27/17 (from the past 72 hour(s))  Urinalysis, Routine w reflex microscopic     Status: None   Collection Time: 12/27/17 10:26 AM  Result Value Ref Range   Color, Urine YELLOW YELLOW   APPearance CLEAR CLEAR   Specific Gravity, Urine 1.023 1.005 - 1.030   pH 6.0 5.0 - 8.0   Glucose, UA NEGATIVE NEGATIVE mg/dL   Hgb urine dipstick NEGATIVE NEGATIVE   Bilirubin Urine NEGATIVE NEGATIVE   Ketones, ur NEGATIVE NEGATIVE mg/dL   Protein, ur NEGATIVE NEGATIVE mg/dL   Nitrite NEGATIVE NEGATIVE   Leukocytes, UA NEGATIVE NEGATIVE    Comment: Performed at [facility], [address] [place], [place] [address]  Type and screen [place]     Status: None   Collection Time: 12/27/17 12:19 PM  Result Value Ref Range   ABO/RH(D) O POS    Antibody Screen NEG    Sample Expiration      12/30/2017 Performed at [facility], [address] [place], [place] [address]   CBC on admission     Status: Abnormal   Collection Time: 12/27/17 12:19 PM  Result Value Ref Range   WBC 8.3 4.0 - 10.5 K/uL   RBC 3.73 (L) 3.87 - 5.11 MIL/uL   Hemoglobin 10.6 (L) 12.0 - 15.0 g/dL   HCT 32.7 (L) 36.0 - 46.0 %   MCV 87.7 78.0 - 100.0 fL   MCH 28.4 26.0 - 34.0 pg   MCHC 32.4 30.0 - 36.0 g/dL   RDW 15.1 11.5 - 15.5 %   Platelets 242 150 - 400 K/uL    Comment: Performed at [facility], [address] [place],  [address]  Urinalysis, Routine w reflex microscopic     Status: None   Collection Time:  12/27/17  1:55 PM  Result Value Ref Range   Color, Urine YELLOW YELLOW   APPearance CLEAR CLEAR   Specific Gravity, Urine 1.010 1.005 - 1.030   pH 6.0 5.0 - 8.0   Glucose, UA NEGATIVE NEGATIVE mg/dL   Hgb urine dipstick NEGATIVE NEGATIVE   Bilirubin Urine NEGATIVE NEGATIVE   Ketones, ur NEGATIVE NEGATIVE mg/dL   Protein, ur NEGATIVE NEGATIVE mg/dL   Nitrite NEGATIVE NEGATIVE   Leukocytes, UA NEGATIVE NEGATIVE    Comment: Performed at [facility], [address], [place],  [address]    Assessment and Plan  [redacted]w[redacted]d short cervix with funiform presentation [name] tomorrow Bed rest Motrin and procardia for tocolytics Type and screen CS if delivery.Pt told this may have to be classical depending on where fibroids are located and she also is at risk for hysterectomy   Patient ID: Laura Cross, female   DOB: Nov 23, 1985, 32 y.o.   MRN: [id number]

## 2017-12-29 NOTE — Progress Notes (Signed)
I introduced spiritual care services to [facility].  She is in good spirits and is adjusting to being here in the hospital until [place] is born.  She has good support from her sister and her boyfriend, whom she lives with.  Her mother is coming from out of town this weekend and has also been a support, along with friends and coworkers from [employer].  I encouraged her to decorate the space and find ways to meaningfully spend time.  She is hoping to get some audio books from her sister and from [employer].    She stated that she found it helpful to talk and would be appreciative of [name] coming to check on her when we are able.  [address] Theodis Aguas Pager, [phone] 1:39 PM    12/29/17 1300  Clinical Encounter Type  Visited With Patient  Visit Type Spiritual support  Referral From Nurse  Spiritual Encounters  Spiritual Needs Emotional

## 2017-12-30 ENCOUNTER — Inpatient Hospital Stay (HOSPITAL_COMMUNITY): Payer: BLUE CROSS/BLUE SHIELD

## 2017-12-30 NOTE — Progress Notes (Addendum)
Pt without complaints.  No leakage of fluid or VB.  Good FM. Mild cramping intermitent unchanged from admissions. Maintaining bedrest, with use of bedpan    BP 104/64 (BP Location: Left Arm)   Pulse 77   Temp 98.7 F (37.1 C)   Resp 16   Ht 5\' 1"  (1.549 m)   Wt 89.9 kg (198 lb 4 oz)   LMP 06/30/2017   SpO2 99%   BMI 37.46 kg/m   FHTS Baseline: 130 bpm and Variability: Good {> 6 bpm)  Toco none  Pt in NAD CV RRR Lungs CTAB abd  Gravid soft and NT GU no vb EXt no calf tenderness Results for orders placed or performed during the hospital encounter of 12/27/17 (from the past 72 hour(s))  Urinalysis, Routine w reflex microscopic     Status: None   Collection Time: 12/27/17 10:26 AM  Result Value Ref Range   Color, Urine YELLOW YELLOW   APPearance CLEAR CLEAR   Specific Gravity, Urine 1.023 1.005 - 1.030   pH 6.0 5.0 - 8.0   Glucose, UA NEGATIVE NEGATIVE mg/dL   Hgb urine dipstick NEGATIVE NEGATIVE   Bilirubin Urine NEGATIVE NEGATIVE   Ketones, ur NEGATIVE NEGATIVE mg/dL   Protein, ur NEGATIVE NEGATIVE mg/dL   Nitrite NEGATIVE NEGATIVE   Leukocytes, UA NEGATIVE NEGATIVE    Comment: Performed at [facility], [address], [place], [place] [address]  Type and screen [place]     Status: None   Collection Time: 12/27/17 12:19 PM  Result Value Ref Range   ABO/RH(D) O POS    Antibody Screen NEG    Sample Expiration      12/30/2017 Performed at [facility], [address] [place], [place] [address]   CBC on admission     Status: Abnormal   Collection Time: 12/27/17 12:19 PM  Result Value Ref Range   WBC 8.3 4.0 - 10.5 K/uL   RBC 3.73 (L) 3.87 - 5.11 MIL/uL   Hemoglobin 10.6 (L) 12.0 - 15.0 g/dL   HCT 32.7 (L) 36.0 - 46.0 %   MCV 87.7 78.0 - 100.0 fL   MCH 28.4 26.0 - 34.0 pg   MCHC 32.4 30.0 - 36.0 g/dL   RDW 15.1 11.5 - 15.5 %   Platelets 242 150 - 400 K/uL    Comment: Performed at [facility], [address]  [place], [place] [address]  Urinalysis, Routine w reflex microscopic     Status: None   Collection Time: 12/27/17  1:55 PM  Result Value Ref Range   Color, Urine YELLOW YELLOW   APPearance CLEAR CLEAR   Specific Gravity, Urine 1.010 1.005 - 1.030   pH 6.0 5.0 - 8.0   Glucose, UA NEGATIVE NEGATIVE mg/dL   Hgb urine dipstick NEGATIVE NEGATIVE   Bilirubin Urine NEGATIVE NEGATIVE   Ketones, ur NEGATIVE NEGATIVE mg/dL   Protein, ur NEGATIVE NEGATIVE mg/dL   Nitrite NEGATIVE NEGATIVE   Leukocytes, UA NEGATIVE NEGATIVE    Comment: Performed at [facility], [address], [place], [place] [address]    Current Facility-Administered Medications:  .  acetaminophen (TYLENOL) tablet 650 mg, 650 mg, Oral, Q4H PRN, Sanjuana Kava, MD, 650 mg at 12/28/17 0051 .  calcium carbonate (TUMS - dosed in mg elemental calcium) chewable tablet 400 mg of elemental calcium, 2 tablet, Oral, Q4H PRN, Sanjuana Kava, MD .  docusate sodium (COLACE) capsule 100 mg, 100 mg, Oral, Daily, Pinn, Rogelio Seen, MD, 100 mg at 12/29/17 0946 .  ibuprofen (ADVIL,MOTRIN) tablet 600 mg, 600 mg, Oral, Q6H, Everett Graff, MD, 600 mg at 12/30/17 0003 .  lactated ringers infusion, , Intravenous, Continuous, Pinn, Rogelio Seen, MD, Last Rate: 125 mL/hr at 12/29/17 0930 .  [COMPLETED] NIFEdipine (PROCARDIA) capsule 20 mg, 20 mg, Oral, Once, 20 mg at 12/27/17 1343 **FOLLOWED BY** NIFEdipine (PROCARDIA) capsule 10 mg, 10 mg, Oral, Q6H, Pinn, Walda, MD, 10 mg at 12/30/17 0003 .  prenatal multivitamin tablet 1 tablet, 1 tablet, Oral, Q1200, Sanjuana Kava, MD, 1 tablet at 12/29/17 1138 .  progesterone (PROMETRIUM) capsule 200 mg, 200 mg, Vaginal, QHS, Pinn, Walda, MD, 200 mg at 12/29/17 2216 .  zolpidem (AMBIEN) tablet 5 mg, 5 mg, Oral, QHS PRN, Sanjuana Kava, MD  Assessment and Plan [redacted]w[redacted]d short cervix with funiform presentation  Continue current plan of care Upcoming tests/treatments:  Continue Procardia for tocolysis, vaginal progesterone and motrin MDs  will follow--will consult with Dr. Charlesetta Garibaldi for plan of care [name] today Bed rest Motrin and procardia for tocolytics continued CS if delivery.dPt told this may have to be classical depending on where fibroids are located and she also is at risk for hysterectomy Will talk to MD about Lovenox (VTE prophylactics)  RN called me at 0500 am on 12/30/2017 stateting, the baby was in a crease and tio was hartd to pick up fetal tones. The RN was in the room assessing and the father woke and became very angry stating he was uncomfortable, therefore called me and I went in to assess. Baby was on the monitor baseline 140s, moderate variability, +acells, over thenight course X2 prolonged decels that were resolved with pt repositioning. Pt remains in supine or trendelenburg positions.    Patient ID: Laura Cross, female   DOB: 1985-10-11, 32 y.o.   MRN: [id number]   Pt having a BM fhts 140 with occ decel for 2 minutes to 100s.  With good recovery toco no contractions [name] normal fluid, footling breech with funiform presentation Continue current care and repeat US on Monday

## 2017-12-31 ENCOUNTER — Inpatient Hospital Stay (HOSPITAL_COMMUNITY): Payer: BLUE CROSS/BLUE SHIELD

## 2017-12-31 ENCOUNTER — Ambulatory Visit (HOSPITAL_COMMUNITY): Payer: BLUE CROSS/BLUE SHIELD

## 2017-12-31 DIAGNOSIS — O288 Other abnormal findings on antenatal screening of mother: Secondary | ICD-10-CM | POA: Diagnosis not present

## 2017-12-31 DIAGNOSIS — O289 Unspecified abnormal findings on antenatal screening of mother: Secondary | ICD-10-CM | POA: Diagnosis not present

## 2017-12-31 DIAGNOSIS — Z3A26 26 weeks gestation of pregnancy: Secondary | ICD-10-CM

## 2017-12-31 NOTE — Plan of Care (Signed)
Pt. Tilts to R/L. Pt. Remains on monitor. No complaints of contractions, vaginal bleeding/discharge. Will monitor.

## 2017-12-31 NOTE — Progress Notes (Signed)
Pt off floor to MFM. Toya Smothers, RN

## 2017-12-31 NOTE — Progress Notes (Signed)
Dr. Alwyn Pea notified in regards to fetal prolonged decels. Instructed to get BPP and direct my concerns to Yvonne Kendall, CNM. Toya Smothers, RN

## 2017-12-31 NOTE — Progress Notes (Signed)
IUP @ [redacted]w[redacted]d with short cervix, transverse with funic presentation  S: RN from 3rd floor called and reported prolonged decel. This decel was approximately 5 minutes until return to baseline with nadir to 60. After return to baseline FHR returned to moderate variability O: BP 107/75 (BP Location: Left Arm)   Pulse 73   Temp 98.8 F (37.1 C) (Oral)   Resp 18   Ht 5\' 1"  (1.549 m)   Wt 89.9 kg (198 lb 4 oz)   LMP 06/30/2017   SpO2 100%   BMI 37.46 kg/m    BPP-8/8  A: Severe Prematurity      Short Cervix   P. Classical C/S with onset of labor       Ante RN to call with each prolonged decel and notify provider  Yvonne Kendall CNM

## 2018-01-01 LAB — URINALYSIS, ROUTINE W REFLEX MICROSCOPIC
Bacteria, UA: NONE SEEN
Bilirubin Urine: NEGATIVE
Glucose, UA: NEGATIVE mg/dL
Ketones, ur: NEGATIVE mg/dL
Leukocytes, UA: NEGATIVE
Nitrite: NEGATIVE
PH: 8 (ref 5.0–8.0)
Protein, ur: NEGATIVE mg/dL
Specific Gravity, Urine: 1.012 (ref 1.005–1.030)

## 2018-01-01 MED ORDER — ACETAMINOPHEN 500 MG PO TABS
1000.0000 mg | ORAL_TABLET | Freq: Three times a day (TID) | ORAL | Status: DC | PRN
  Administered 2018-01-01 – 2018-01-04 (×5): 1000 mg via ORAL
  Filled 2018-01-01 (×5): qty 2

## 2018-01-01 MED ORDER — LACTATED RINGERS IV BOLUS
1000.0000 mL | Freq: Once | INTRAVENOUS | Status: AC
  Administered 2018-01-01: 1000 mL via INTRAVENOUS

## 2018-01-01 NOTE — Progress Notes (Signed)
Pt Sleeping

## 2018-01-01 NOTE — Consult Note (Signed)
[facility]  Consultation Service: Neonatology   Dr. Landry Mellow has asked for consultation on Laura Cross regarding possible delivery of a premature infant at [redacted] weeks gestation. Thank you for inviting [name] to see this patient.   Reason for consult:  32 year old female at 26+[redacted] weeks gestation today.  Estimated weight of 649 grams (26th percentile) on 12/23/17.  She is admitted for premature cervical shortening and threatened pre-term labor.  If laboring, plan for c-section due to breech presentation with umbilical cord overlying the cervix.    I have reviewed the patient's chart and have met with her. The salient information is as follows:   Prenatal labs:  O+, DAT neg HepB neg HIV  nonreactive Rubella immune VDRL nonreactive   Prenatal care:   good Pregnancy complications:  none, preterm labor Maternal antibiotics:     none Maternal Steroids: Yes, 5/10-11  Ultrasound: Normal anatomy, breech position  Counseling:   I discussed the possible complications and outcomes of prematurity at this gestational age. I discussed the potential need for resuscitation at birth, mechanical ventilation and surfactant administration for respiratory distress, IV fluids pending establishment of enteral feeds (encouraged breast milk feeding to which she planned to do), antibiotics for possible sepsis, temperature support, and monitoring. I also discussed the potential risk of complications such as intracranial hemorrhage, retinopathy, hearing deficit, and chronic lung disease. I also discussed the potential length of stay in the neonatal intensive care unit.   Ms. Laura Cross was informed that our team will be present at delivery, and all of her questions were answered.  A visit to the NICU by the infant's mother is not possible at this time, as she is on strict bedrest, but visitation policy and expectations after delivery were discussed.    _______________________________________________________________  Thank you for asking [name] to participate in the care of this patient. Please do not hesitate to contact us again if you are aware of any further ways we can be of assistance.   Sincerely,  Towana Badger, MD Neonatologist  I spent ~40 minutes in consultation time, of which 25 minutes was spent in direct face to face counseling.

## 2018-01-01 NOTE — Progress Notes (Signed)
[name] at 26 wks and 3 days with short cervix and preterm labor.   Subjective. Pt reports lower pelvic cramping that is constant. No leakage of fluis no vaginal bleeding + fM  Vitals:   01/01/18 0839 01/01/18 1157  BP: 114/67 (!) 102/55  Pulse: 83 87  Resp:  16  Temp:  98.1 F (36.7 C)  SpO2:  98%   General Alert and oriented slightly anxious  Lungs good effort  Abdomen Gravid nontender  Ext scds in place no evidence of DVT FHR baseline 150 moderate varibiilty. Accelerations present .Marland Kitchen Occasional variable deceleration to 90's with return to baseline  UC- occasional contraction noted   Speculum exam Cervix appears closed   A/P 26 wks and 3 days with shortened cervix and preterm contractions  Continue procardia for tocolysis  Fetal Well being - BPP yesterday 8/8... fetal heart rate is over all reassuring  Transverse presentation- if pt labor plan cesarean section.  Continue inpatient management for now

## 2018-01-02 LAB — TYPE AND SCREEN
ABO/RH(D): O POS
ANTIBODY SCREEN: NEGATIVE

## 2018-01-02 NOTE — Progress Notes (Signed)
[name] at 26 wks and 4 days with short cervix preterm contractions and funic presentation  Subjective. Feels better today . Was able to sleep well last night. Cramping has improved is only intermittent and not occurring currently.  No leakage of fluid no vaginal bleeding + fM   Vitals:   01/01/18 1649 01/01/18 1955 01/02/18 0430 01/02/18 0836  BP: 108/70 118/76 100/66 121/75  Pulse: 80 (!) 105 72 92  Resp: 16 18 18 18   Temp:  97.8 F (36.6 C) 98.4 F (36.9 C) 97.8 F (36.6 C)  TempSrc:  Oral Oral Oral  SpO2: 100% 99% 100% 100%  Weight:      Height:        General Alert and oriented. No distress   Lungs good effort  Abdomen Gravid nontender  Ext scds in place no evidence of DVT FHR baseline 150 moderate varibiilty. Accelerations present .Marland Kitchen Occasional variable deceleration to 120's with return to baseline  UC-  No contractions     A/P 26 wks and 4 days with shortened cervix and preterm contractions /Funic presenation  Continue procardia for tocolysis  Fetal Well being - BPP 5/24= 8/8... fetal heart rate is over all reassuring  Transverse /Funic presentation- if pt labor plan cesarean section- consider repeat ultrasound this week to reevaluate funic presentation .  Continue inpatient management for now

## 2018-01-02 NOTE — Progress Notes (Addendum)
Prolonged decel, lasting about 4 minutes, 90 at nadir, unwitnessed, resolved spontaneously.

## 2018-01-02 NOTE — Progress Notes (Signed)
Difficult to trace baby from 11:07 to 11:44. 3 RN's to assist in getting baby back on the monitor. Pt reports active fetal movement during this time.

## 2018-01-03 ENCOUNTER — Inpatient Hospital Stay (HOSPITAL_COMMUNITY): Payer: BLUE CROSS/BLUE SHIELD

## 2018-01-03 MED ORDER — OXYCODONE HCL 5 MG PO TABS
5.0000 mg | ORAL_TABLET | Freq: Four times a day (QID) | ORAL | Status: DC | PRN
  Administered 2018-01-03 – 2018-01-05 (×3): 5 mg via ORAL
  Filled 2018-01-03 (×3): qty 1

## 2018-01-03 NOTE — Progress Notes (Signed)
G2P0020 and 26 wks and 5 days with short cervix (1.4 cm) / preterm contractions and funic presentation.   Subjective. Pt has a headache. She received 1 gram of tylenol this morning around 6 am and headache only partially resolved. She denies contractions + FM no leakage of fluid. Patient complaining of bed changing positions periodically without manipulation of control.   Vitals:   01/02/18 2320 01/03/18 0100 01/03/18 0531 01/03/18 0720  BP: 116/75  120/71 122/75  Pulse: 89  71 92  Resp: 18  16 18   Temp: 98 F (36.7 C)  97.9 F (36.6 C) 98.4 F (36.9 C)  TempSrc: Oral  Oral Oral  SpO2: 100% 98% 99% 100%  Weight:      Height:        General alert and oriented no distress  Lungs good effort  Abdomen gravid nontender  Ext .Marland Kitchen No edema  FHR Baseline 140's moderate variability . Accelerations present.. Occasional variable to the 120's with return to baseline. Currently decel to 80 for 2 minutes with return to baseline after repositioning.   Ultrasound today. Cervical length 1.4 cm  Presentation breech with Funic presentation.   A/P 26 wks and 5 days with shortened cervix and preterm contractions /Funic presentation  -Continue procardia for tocolysis  -Fetal Well being - BPP 5/24= 8/8... fetal heart rate is over all reassuring  -Breech /Funic presentation- if pt labors plan cesarean section-Continue strict bedrest with trendelenburg position -Headache- tylenol prn oxycodone for break through pain.  -Continue inpatient management for now

## 2018-01-03 NOTE — Progress Notes (Addendum)
0641:Writer was called to patients' room, on arriving at pts' bedside, I found pt crying and very anxious. When asked what was wrong pt stated ' I am having a panic attack and my head is hurting. I felt like I cannot breath" Pt denies epigastric pain or blurred vision.  Pt was comforted and given Tyl. 650 mg po for her headache. We remained with patient for an extended time after which pt became calm and stop crying. FOB was at the bedside looking on. We will inform MD when he makes rounds this am.

## 2018-01-04 NOTE — Progress Notes (Addendum)
32 y.o. G2P0020 and 26 wks and 5 days with short cervix (1.4 cm) / preterm contractions and funic presentation.   Patient in bed resting, trendelenburg. Denies any symptoms. Not contracting, no vaginal bleeding, or loss of fluid.  Vitals:   01/04/18 1559 01/04/18 2021 01/05/18 0003 01/05/18 0420  BP: 121/73 106/60 109/74 102/69  Pulse: 78 86 88 90  Resp: 17 18 18 16   Temp: 98.1 F (36.7 C) 97.7 F (36.5 C) 98.3 F (36.8 C) 98.8 F (37.1 C)  TempSrc: Oral Oral Oral Oral  SpO2: 100% 96% 98% 94%  Weight:      Height:       Over the course of the night: FHR 150s with moderate beat to beat variability  2 prolonged decels at night, one at 2208 down to 120s and one at 0510 down to 60s with return to baseline and good variability, overall reassuring Occasional mild contractions, not regular   Ultrasound 01/04/18. Cervical length 1.4 cm  Presentation breech with Funic presentation.   32 y.o. G2P0 at 26 wks and 7 days with shortened cervix and preterm contractions /Funic presentation  -Continue procardia for tocolysis  -VTE prophylactics: pt to continue to wear SCD.  -Continue nightly vaginal progesterone  -Fetal heart rate is over all reassuring  -Breech /Funic presentation- if patient labors plan cesarean section -Continue strict bedrest with trendelenburg position  -Continue current plan of care   MD Attestation:   I saw and examined patient at bedside and agree with above findings assessment and plan as per Vivianne Master. Patient requests to sit up when she eats as otherwise she gets trouble swallowing food, will allow.  Also she gets acid reflux symptoms, will start protonix daily.  NST shows baseline 150, mod variability, reactive.  Prolonged deceleration to 60 for 5 minutes with return to baseline. TOCO: no contractions.  Dr. Alesia Richards. 01/05/18: 1219 pm.   ADDENDUM:  Discussed case with MFM- Dr. Burnett Harry over telephone: Will repeat cervical length [name] tomorrow and have an MFM  consultation on further management of the patient tomorrow. Discussed with Dr. Burnett Harry, patient does not need to be in Trendelenburg position, may do modified bed rest with bathroom privileges.  Dr. Alesia Richards. 01/05/18 1519.

## 2018-01-04 NOTE — Progress Notes (Signed)
S: Rn called at 1600 to inform me that pt had a 4 mins prolonged decle that dipped to the 90s at 1548 today, RN repositioned the pt and decels stopped, FHR returned to baseline Most Recent FHR  O: BP 121/73 (BP Location: Left Arm)   Pulse 78   Temp 98.1 F (36.7 C) (Oral)   Resp 17   Ht 5\' 1"  (1.549 m)   Wt 89.9 kg (198 lb 4 oz)   LMP 06/30/2017   SpO2 100%   BMI 37.46 kg/m    Most Recent Value  Fetal Heart Rate A  Mode External  [MD notified] filed at 01/04/2018 1555  Baseline Rate (A) 145 bpm filed at 01/04/2018 1555  Variability <5 BPM, 6-25 BPM filed at 01/04/2018 1555  Accelerations 10 x 10 filed at 01/04/2018 1555  Decelerations Prolonged, Variable filed at 01/04/2018 1555   A: G2P0020 and 26 wks and 5 days with short cervix (1.4 cm) / preterm contractions and funic presentation. With X4 mins prolonged decel; resolved now   Currently Cat 2 fetal HR tracing.  P: -Continue procardia for tocolysis  -VTE prophylactics: pt to continue to wear SCD.  -continue nightly vaginal progesterone  -Fetal Well being - BPP5/24=8/8... fetal heart rate is over all reassuring  -Breech/Funicpresentation- if pt labors plan cesarean section -Continue strict bedrest with trendelenburg position  -Continue inpatient management for now   Will report off to Dr. Charlesetta Garibaldi

## 2018-01-04 NOTE — Progress Notes (Signed)
G2P0020 and 26 wks and 5 days with short cervix (1.4 cm) / preterm contractions and funic presentation.   Subjective. Pt resting in bed with no complaints, pt able to adjust position in trendelenburg and utilized bed pan by herself in bed. Pt denies swelling, HA, vision changes or abdominal pain, no SOB or CP. She denies contractions + FM no leakage of fluid.  Pt appears to be in good spirits and accepts what is best for the baby. Pt left off monitor for a moment to eat breakfast. RN reports pt had 2 prolonged delcels during the night but never dropped below 120bpm. Decels were corrected by position alteration.   Vitals:   01/03/18 1947 01/03/18 2316 01/04/18 0429 01/04/18 0752  BP: 118/77 117/65 110/67 111/72  Pulse: 94 90 75 83  Resp: 17 16 17 18   Temp: 98 F (36.7 C) 98.4 F (36.9 C) 98.7 F (37.1 C) 98.6 F (37 C)  TempSrc:  Oral  Oral  SpO2: 99% 100% 98% 97%  Weight:      Height:        General alert and oriented no distress  Lungs good effort  Abdomen gravid nontender  Ext .Marland Kitchen No edema  Over the course of the night: FHR Baseline 145's moderate variability . Accelerations present.. Occasional variable to the 120's with return to baseline. Moderate variability (>6bpm).   EFM Now: off for pt to use bedpan and eat a couple bites of food, RN to reapply ASAP.   Ultrasound today. Cervical length 1.4 cm  Presentation breech with Funic presentation.   A/P 26 wks and 6 days with shortened cervix and preterm contractions /Funic presentation  -Continue procardia for tocolysis  -VTE prophylactics: pt to continue to wear SCD.  -continue nightly vaginal progesterone  -Fetal Well being - BPP 5/24= 8/8... fetal heart rate is over all reassuring  -Breech /Funic presentation- if pt labors plan cesarean section -Continue strict bedrest with trendelenburg position  -Continue inpatient management for now

## 2018-01-05 MED ORDER — PANTOPRAZOLE SODIUM 20 MG PO TBEC
20.0000 mg | DELAYED_RELEASE_TABLET | Freq: Every day | ORAL | Status: DC
  Administered 2018-01-05 – 2018-01-06 (×2): 20 mg via ORAL
  Filled 2018-01-05 (×3): qty 1

## 2018-01-05 NOTE — Progress Notes (Signed)
Initial Nutrition Assessment  DOCUMENTATION CODES:   Obesity unspecified  INTERVENTION:  Regular diet May order double protein portions, snacks TID and from retail  NUTRITION DIAGNOSIS:   Increased nutrient needs related to (pregnancy and fetal growth requirements) as evidenced by (27 weeks IUP).  GOAL:   Patient will meet greater than or equal to 90% of their needs  MONITOR:   Weight trends  REASON FOR ASSESSMENT:   Antenatal    ASSESSMENT:   27 weeks, incompetent cervix. Maintained in trendelenberg position. Pre-preg weight 191 lbs, BMI 36.1.  7 lb weight gain.  Pt reports good appetite and better tol/comfort with abilty to eat in an upright position   Diet Order:   Diet Order           Diet regular Room service appropriate? Yes; Fluid consistency: Thin  Diet effective now          EDUCATION NEEDS:   No education needs have been identified at this time  Skin:  Skin Assessment: Reviewed RN Assessment   Height:   Ht Readings from Last 1 Encounters:  12/28/17 5\' 1"  (1.549 m)    Weight:   Wt Readings from Last 1 Encounters:  12/28/17 198 lb 4 oz (89.9 kg)    Ideal Body Weight:     BMI:  Body mass index is 37.46 kg/m.  Estimated Nutritional Needs:   Kcal:  2000-2200  Protein:  90-100 g  Fluid:  2.3 L    Weyman Rodney M.Fredderick Severance LDN Neonatal Nutrition Support Specialist/RD III Pager [phone]      Phone [phone]

## 2018-01-05 NOTE — Progress Notes (Signed)
Pt Sleeping

## 2018-01-06 ENCOUNTER — Inpatient Hospital Stay (HOSPITAL_COMMUNITY): Payer: BLUE CROSS/BLUE SHIELD

## 2018-01-06 LAB — TYPE AND SCREEN
ABO/RH(D): O POS
ANTIBODY SCREEN: NEGATIVE

## 2018-01-06 NOTE — Progress Notes (Signed)
Hospital day # 10 pregnancy at [redacted]w[redacted]d--preterm labor.  S:  Denies contractions. FM+      Perception of contractions: none      Vaginal bleeding: none        Vaginal discharge:  none  O: BP 111/62 (BP Location: Left Arm)   Pulse 99   Temp 98.7 F (37.1 C) (Oral)   Resp 18   Ht 5\' 1"  (1.549 m)   Wt 89.9 kg (198 lb 4 oz)   LMP 06/30/2017   SpO2 99%   BMI 37.46 kg/m       Fetal tracings:145 positive accels, variability present      Contractions:None         Uterus gravid and non-tender      Extremities: extremities normal, atraumatic, no cyanosis or edema and no significant edema and no signs of DVT          Labs:  None new       Meds:   docusate sodium  100 mg Oral Daily  . NIFEdipine  10 mg Oral Q6H  . pantoprazole  20 mg Oral Daily  . prenatal multivitamin  1 tablet Oral Q1200  . progesterone  200 mg Vaginal QHS        A: [redacted]w[redacted]d with short cervix stable funic presentation     stable  P: Continue current plan of care      Per MFM can be discharge to home if close, transportation and be on modified bedrest      Pre discussion with MD will do modified bedrest in hospital tonight and reevaluate discharge tomorrow.  Starla Link CNM, MSN 01/06/2018 8:13 PM

## 2018-01-06 NOTE — Consult Note (Signed)
Follow up consultation note  Ultrasound today shows the cervix to be stable, with a total closed length of 25mm, improved from our evaluation on 5/23, when it was 8mm. As per Dr. Hurley Cisco advice yesterday, given the stability of the cervix she is stable for home management if the social situation is amenable (transportation, close to hospital, someone to stay with her, ability to conform to modified bedrest). Repeat evaluation in 4 weeks to assess persistence of funic presentation is recommended, but the funic presentation is not a reason to keep her as an inpatient

## 2018-01-06 NOTE — Progress Notes (Signed)
Pt off monitor. Iv capped off and covered pt to shower.  No complaints

## 2018-01-06 NOTE — Progress Notes (Signed)
Hospital day # 10 pregnancy at [redacted]w[redacted]d--Short cervix, PTL, funic presentation.  S:  Doing well--very happy to be able to sit up a bit, and to use BSC. Reports reflux improved with more upright positioning.      Perception of contractions: Occasional cramping, no worse since activity change.      Vaginal bleeding: None       Vaginal discharge:  None  O: BP 122/76 (BP Location: Left Arm)   Pulse 88   Temp 98.5 F (36.9 C) (Oral)   Resp 17   Ht 5\' 1"  (1.549 m)   Wt 89.9 kg (198 lb 4 oz)   LMP 06/30/2017   SpO2 100%   BMI 37.46 kg/m       Fetal tracings:  Baseline 150s, moderate variability, occasional mild variables.  No prolonged decels during night.      Contractions:   Irregular, mild, some irritability.      Uterus non-tender      Extremities: no significant edema and no signs of DVT, SCDs          Labs:  T&S done 01/02/18       Meds:  . docusate sodium  100 mg Oral Daily  . NIFEdipine  10 mg Oral Q6H  . pantoprazole  20 mg Oral Daily  . prenatal multivitamin  1 tablet Oral Q1200  . progesterone  200 mg Vaginal QHS    A: [redacted]w[redacted]d with short cervix, PTL, funic presentation     Stable  P: Continue current plan of care--Procardia, vaginal progesterone.      Upcoming tests/treatments:  Needs T&S q 72 hours, ordered for today and q 72 hours. Repeat US today for cervical length MFM consult today for recommendations on patient management. Continue modified BR at present, BSC use.      MDs will follow--Dr. Mancel Bale updated on patient status.  Donnel Saxon CNM, [place] 01/06/2018 7:37 AM

## 2018-01-06 NOTE — Progress Notes (Signed)
Follow up visit with [facility].  She reports she is excited to be able to get out of bed to go to the bathroom after 11 days of total bed rest.  I celebrated small victories and normalized frustration over being stuck in the hospital during the last months of her pregnancy.  Danene praised her boyfriend's presence at the hospital every night and shared that he brings her lunch most days as well.  Her sister is a Catering manager and also recently visited during a stop through.  During the end of our visit, FOB came to the room.  He was celebratory about Roshaunda's ability to get out of bed and appears supportive as evidenced by willingness to be present in the hospital and create space for [facility] to express her feelings.    Will continue to follow while patient is inpatient and patient is aware of how to contact spiritual care for support.  Please page as further needs arise.  Donald Prose. Elyn Peers, M.Div. [facility] Chaplain Pager [phone] Office [phone]      01/06/18 1300  Clinical Encounter Type  Visited With Patient and family together  Visit Type Follow-up;Spiritual support  Spiritual Encounters  Spiritual Needs Emotional

## 2018-01-07 MED ORDER — NIFEDIPINE 10 MG PO CAPS: 10.0000 mg | ORAL_CAPSULE | Freq: Four times a day (QID) | ORAL | 2 refills | Status: DC

## 2018-01-07 NOTE — Discharge Instructions (Signed)
Preterm Labor and Birth Information The normal length of a pregnancy is 39-41 weeks. Preterm labor is when labor starts before 37 completed weeks of pregnancy. What are the risk factors for preterm labor? Preterm labor is more likely to occur in women who:  Have certain infections during pregnancy such as a bladder infection, sexually transmitted infection, or infection inside the uterus (chorioamnionitis).  Have a shorter-than-normal cervix.  Have gone into preterm labor before.  Have had surgery on their cervix.  Are younger than age 89 or older than age 19.  Are African American.  Are pregnant with twins or multiple babies (multiple gestation).  Take street drugs or smoke while pregnant.  Do not gain enough weight while pregnant.  Became pregnant shortly after having been pregnant.  What are the symptoms of preterm labor? Symptoms of preterm labor include:  Cramps similar to those that can happen during a menstrual period. The cramps may happen with diarrhea.  Pain in the abdomen or lower back.  Regular uterine contractions that may feel like tightening of the abdomen.  A feeling of increased pressure in the pelvis.  Increased watery or bloody mucus discharge from the vagina.  Water breaking (ruptured amniotic sac).  Why is it important to recognize signs of preterm labor? It is important to recognize signs of preterm labor because babies who are born prematurely may not be fully developed. This can put them at an increased risk for:  Long-term (chronic) heart and lung problems.  Difficulty immediately after birth with regulating body systems, including blood sugar, body temperature, heart rate, and breathing rate.  Bleeding in the brain.  Cerebral palsy.  Learning difficulties.  Death.  These risks are highest for babies who are born before 37 weeks of pregnancy. How is preterm labor treated? Treatment depends on the length of your pregnancy, your  condition, and the health of your baby. It may involve:  Having a stitch (suture) placed in your cervix to prevent your cervix from opening too early (cerclage).  Taking or being given medicines, such as: ? Hormone medicines. These may be given early in pregnancy to help support the pregnancy. ? Medicine to stop contractions. ? Medicines to help mature the babys lungs. These may be prescribed if the risk of delivery is high. ? Medicines to prevent your baby from developing cerebral palsy.  If the labor happens before 34 weeks of pregnancy, you may need to stay in the hospital. What should I do if I think I am in preterm labor? If you think that you are going into preterm labor, call your health care provider right away. How can I prevent preterm labor in future pregnancies? To increase your chance of having a full-term pregnancy:  Do not use any tobacco products, such as cigarettes, chewing tobacco, and e-cigarettes. If you need help quitting, ask your health care provider.  Do not use street drugs or medicines that have not been prescribed to you during your pregnancy.  Talk with your health care provider before taking any herbal supplements, even if you have been taking them regularly.  Make sure you gain a healthy amount of weight during your pregnancy.  Watch for infection. If you think that you might have an infection, get it checked right away.  Make sure to tell your health care provider if you have gone into preterm labor before.  This information is not intended to replace advice given to you by your health care provider. Make sure you discuss any questions  think that you might have an infection, get it checked right away.   Make sure to tell your health care provider if you have gone into preterm labor before.    This information is not intended to replace advice given to you by your health care provider. Make sure you discuss any questions you have with your health care provider.  Document Released: 10/17/2003 Document Revised: 01/07/2016 Document Reviewed: 12/18/2015  Elsevier Interactive Patient Education  2018 [employer]

## 2018-01-07 NOTE — Discharge Summary (Signed)
Physician Discharge Summary  Patient ID: Laura Cross MRN: [id number] DOB/AGE: [birth year] 32 y.o.  Admit date: 12/27/2017 Discharge date: 01/07/2018  Admission Diagnoses:  Discharge Diagnoses:  Active Problems:   Short cervix   [redacted] weeks gestation of pregnancy   Presentation of cord   Preterm labor   Non-stress test with decelerations   [redacted] weeks gestation of pregnancy   Discharged Condition: stable  Hospital Course: Admitted 12/27/17 at 25 5/7 weeks with PTL.  Had prior admission the week before for steroids for fetal lung maturity due to short cervix with funneling noted on [name] (23 mm cervical length).  Hx is also remarkable for fibroids, with patient already on vaginal progesterone.  Cervix was closed, 70%, vtx, presenting part at 0 station.  She was admitted for monitoring, Nifedipine for tocolysis, and MFM consultation, and complete BR.  Cervix by [name] was 1.4 cm, with breech with funic presentation.  Scattered prolonged decels were noted, but recovered with position changes, with moderate variability throughout.  Patient remained stable over the next several days.  MFM f/u consult occurred on 5/29, and f/u US on 5/30 showed cervix stable, with closed length of 14 mm, improved from prior evaluation on 12/30/17, when it was 10 mm.  They deemed her appropriate for home management, if the social situation was amenable to maintenance of BR, close to hospital, adequate transportation--which were all accomplishable and had patient commitment to the plan of care.  She is d/c'd home on 5/31, with plan for modified BR at home, close monitoring of cramping, continue vaginal progesterone and Nifedipine regimens, and f/u at [place] in 1 week.  Consults: MFM and neonatology  Significant Diagnostic Studies: Ultrasound  Treatments: BR, Nifedipine for tocolysis, vaginal progesterone  Discharge Exam: Blood pressure 112/66, pulse 94, temperature 98.5 F (36.9 C), temperature source Oral, resp. rate 18,  height 5\' 1"  (1.549 m), weight 89.9 kg (198 lb 4 oz), last menstrual period 06/30/2017, SpO2 98 %. General appearance: alert Resp: clear to auscultation bilaterally Cardio: regular rate and rhythm, S1, S2 normal, no murmur, click, rub or gallop Pelvic: uterus normal size, shape, and consistency and NT Extremities: extremities normal, atraumatic, no cyanosis or edema  Disposition: Discharge disposition: 01-Home or Self Care       Discharge Instructions    Discharge patient   Complete by:  As directed    Discharge disposition:  01-Home or Self Care   Discharge patient date:  01/07/2018   Discharge patient   Complete by:  As directed    Discharge disposition:  01-Home or Self Care   Discharge patient date:  01/07/2018     Allergies as of 01/07/2018      Reactions   Meloxicam Other (See Comments)   Bleeding?  Can tolerate other NSAIDs   Metformin And Related    Diarrhea, hair falling out   Nuvaring [etonogestrel-ethinyl Estradiol]    Flu-like symptoms      Medication List    STOP taking these medications   guaiFENesin 200 MG tablet   ibuprofen 600 MG tablet Commonly known as:  ADVIL,MOTRIN     TAKE these medications   FERROUS SULFATE PO Take 1 tablet by mouth daily.   NIFEdipine 10 MG capsule Commonly known as:  PROCARDIA Take 1 capsule (10 mg total) by mouth every 6 (six) hours.   PRENATAL GUMMIES/DHA & FA PO Take 2 capsules by mouth daily.   PROGESTERONE [place] 200 mg vaginally at bedtime.      Follow-up Information  [facility]. Schedule an appointment as soon as possible for a visit in 1 week(s).   Specialty:  Obstetrics and Gynecology Why:  Office will call to schedule appt. Contact information: [address] [place] [address] [phone]          Signed: Donnel Saxon 01/07/2018, 8:18 AM

## 2018-01-09 ENCOUNTER — Inpatient Hospital Stay (HOSPITAL_COMMUNITY): Payer: BLUE CROSS/BLUE SHIELD

## 2018-01-09 ENCOUNTER — Encounter (HOSPITAL_COMMUNITY): Payer: Self-pay

## 2018-01-09 ENCOUNTER — Inpatient Hospital Stay (HOSPITAL_COMMUNITY)
Admission: AD | Admit: 2018-01-09 | Discharge: 2018-02-17 | Disposition: A | Discharge status: Home / Self Care | Payer: BLUE CROSS/BLUE SHIELD | Source: Ambulatory Visit | Attending: Obstetrics & Gynecology | Admitting: Obstetrics & Gynecology

## 2018-01-09 ENCOUNTER — Other Ambulatory Visit: Payer: Self-pay

## 2018-01-09 DIAGNOSIS — O34219 Maternal care for unspecified type scar from previous cesarean delivery: Secondary | ICD-10-CM

## 2018-01-09 DIAGNOSIS — Z3A27 27 weeks gestation of pregnancy: Secondary | ICD-10-CM

## 2018-01-09 DIAGNOSIS — D259 Leiomyoma of uterus, unspecified: Secondary | ICD-10-CM

## 2018-01-09 DIAGNOSIS — O479 False labor, unspecified: Secondary | ICD-10-CM

## 2018-01-09 DIAGNOSIS — O26879 Cervical shortening, unspecified trimester: Secondary | ICD-10-CM

## 2018-01-09 DIAGNOSIS — O4700 False labor before 37 completed weeks of gestation, unspecified trimester: Secondary | ICD-10-CM

## 2018-01-09 DIAGNOSIS — N883 Incompetence of cervix uteri: Secondary | ICD-10-CM

## 2018-01-09 DIAGNOSIS — Z3A30 30 weeks gestation of pregnancy: Secondary | ICD-10-CM

## 2018-01-09 DIAGNOSIS — O26872 Cervical shortening, second trimester: Secondary | ICD-10-CM

## 2018-01-09 DIAGNOSIS — R109 Unspecified abdominal pain: Secondary | ICD-10-CM

## 2018-01-09 DIAGNOSIS — O3413 Maternal care for benign tumor of corpus uteri, third trimester: Secondary | ICD-10-CM

## 2018-01-09 DIAGNOSIS — Z3A31 31 weeks gestation of pregnancy: Secondary | ICD-10-CM

## 2018-01-09 LAB — URINALYSIS, ROUTINE W REFLEX MICROSCOPIC
BILIRUBIN URINE: NEGATIVE
Glucose, UA: NEGATIVE mg/dL
Hgb urine dipstick: NEGATIVE
KETONES UR: NEGATIVE mg/dL
Nitrite: NEGATIVE
PROTEIN: 30 mg/dL — AB
Specific Gravity, Urine: 1.02 (ref 1.005–1.030)
pH: 6 (ref 5.0–8.0)

## 2018-01-09 LAB — TYPE AND SCREEN
ABO/RH(D): O POS
ANTIBODY SCREEN: NEGATIVE

## 2018-01-09 LAB — CBC
HEMATOCRIT: 37.3 % (ref 36.0–46.0)
HEMOGLOBIN: 12 g/dL (ref 12.0–15.0)
MCH: 27.6 pg (ref 26.0–34.0)
MCHC: 32.2 g/dL (ref 30.0–36.0)
MCV: 85.9 fL (ref 78.0–100.0)
Platelets: 365 10*3/uL (ref 150–400)
RBC: 4.34 MIL/uL (ref 3.87–5.11)
RDW: 14.8 % (ref 11.5–15.5)
WBC: 6.3 10*3/uL (ref 4.0–10.5)

## 2018-01-09 MED ORDER — SODIUM CHLORIDE 0.9% FLUSH
3.0000 mL | INTRAVENOUS | Status: DC | PRN
  Administered 2018-01-16 – 2018-01-24 (×2): 3 mL via INTRAVENOUS
  Filled 2018-01-09 (×2): qty 3

## 2018-01-09 MED ORDER — PROGESTERONE MICRONIZED 200 MG PO CAPS
200.0000 mg | ORAL_CAPSULE | Freq: Every day | ORAL | Status: DC
  Administered 2018-01-09 – 2018-02-13 (×36): 200 mg via VAGINAL
  Filled 2018-01-09 (×35): qty 1

## 2018-01-09 MED ORDER — LACTATED RINGERS IV BOLUS
200.0000 mL | Freq: Once | INTRAVENOUS | Status: AC
  Administered 2018-01-09: 200 mL via INTRAVENOUS

## 2018-01-09 MED ORDER — SODIUM CHLORIDE 0.9% FLUSH
3.0000 mL | Freq: Two times a day (BID) | INTRAVENOUS | Status: DC
  Administered 2018-01-11 – 2018-02-13 (×51): 3 mL via INTRAVENOUS

## 2018-01-09 MED ORDER — PRENATAL MULTIVITAMIN CH
1.0000 | ORAL_TABLET | Freq: Every day | ORAL | Status: DC
  Administered 2018-01-09 – 2018-01-22 (×14): 1 via ORAL
  Filled 2018-01-09 (×16): qty 1

## 2018-01-09 MED ORDER — DOCUSATE SODIUM 100 MG PO CAPS
100.0000 mg | ORAL_CAPSULE | Freq: Every day | ORAL | Status: DC
  Administered 2018-01-09 – 2018-02-13 (×36): 100 mg via ORAL
  Filled 2018-01-09 (×39): qty 1

## 2018-01-09 MED ORDER — SODIUM CHLORIDE 0.9 % IV SOLN: 250.0000 mL | INTRAVENOUS | Status: DC | PRN

## 2018-01-09 MED ORDER — ACETAMINOPHEN 325 MG PO TABS
650.0000 mg | ORAL_TABLET | ORAL | Status: DC | PRN
  Administered 2018-01-10 – 2018-01-26 (×9): 650 mg via ORAL
  Filled 2018-01-09 (×10): qty 2

## 2018-01-09 MED ORDER — LACTATED RINGERS IV SOLN
INTRAVENOUS | Status: DC
  Administered 2018-01-09 – 2018-01-17 (×24): via INTRAVENOUS
  Administered 2018-02-12: 125 mL/h via INTRAVENOUS
  Administered 2018-02-14: 05:00:00 via INTRAVENOUS

## 2018-01-09 MED ORDER — FENTANYL CITRATE (PF) 100 MCG/2ML IJ SOLN
100.0000 ug | INTRAMUSCULAR | Status: DC | PRN
  Administered 2018-01-09 – 2018-01-10 (×5): 100 ug via INTRAVENOUS
  Filled 2018-01-09 (×5): qty 2

## 2018-01-09 MED ORDER — CALCIUM CARBONATE ANTACID 500 MG PO CHEW
2.0000 | CHEWABLE_TABLET | ORAL | Status: DC | PRN
  Administered 2018-01-23: 400 mg via ORAL
  Filled 2018-01-09: qty 2

## 2018-01-09 MED ORDER — ZOLPIDEM TARTRATE 5 MG PO TABS
5.0000 mg | ORAL_TABLET | Freq: Every evening | ORAL | Status: DC | PRN
  Administered 2018-01-12: 5 mg via ORAL
  Filled 2018-01-09: qty 1

## 2018-01-09 MED ORDER — NIFEDIPINE 10 MG PO CAPS
10.0000 mg | ORAL_CAPSULE | Freq: Four times a day (QID) | ORAL | Status: DC
  Administered 2018-01-09 – 2018-01-10 (×5): 10 mg via ORAL
  Filled 2018-01-09 (×5): qty 1

## 2018-01-09 NOTE — H&P (Addendum)
Anterpartum History and Physical Ms MURRELL ELIZONDO is a 32 year old [name] at 27 weeks 4 days who presents c/o constant painful pelvic pressure and RLQ pain. Pt being admitted due to PTL precautions, cervical change since last [name].  Previous US: 01/03/2018, placenta posterior, funic presentation, breech, cervix 1.4cm in length, funneling of internal os noted.   Todays [name] preliminary results: 01/09/2018, cervix appears funneling to the OS, which is open to 3.60mm with debris noted.   MAU Note: Ms KIRAH STICE is a 32 year old [name] at 27 weeks 4 days who presents c/o constant painful pelvic pressure and RLQ pain. She was recently admitted and stay for over 1 week  And dx with short cervix measuring 1.4cm with cervical funneling and funic presentation last [name] revieved breech presentation. Pt  was seen just prior to that admissions and admitted a week before for steroids for fetal lung maturity as during routine. Post last admission d/c to immediately had to walk up 10 stairs, pt noted that after that which was x2 days ago pt had constant pelvic pressure and pain in right lower quand. Nothing made the pain better or worse, not relieved by 2 doses of tylenol yesterday.  Pt now endorses pain has resolved.  She presently denies vaginal Bleeding, no leaking of fluid. She notes good fetal movement. Pt denies HA/N/V/D/Rashes/dysurai/No vaginal penetration of truama/no HA, vision changes, RUQ pain or swelling., denies cp or sob. Pt endorses remaining on bed rest at home.   Home meds:  TAKE these medications  FERROUS SULFATE PO Take 1 tablet by mouth daily.  NIFEdipine 10 MG capsule Commonly known as:  PROCARDIA Take 1 capsule (10 mg total) by mouth every 6 (six) hours.  PRENATAL GUMMIES/DHA & FA PO Take 2 capsules by mouth daily.  PROGESTERONE [facility] 200 mg vaginally at bedtime.  Pregnancy complicated by Short cervix - steroid complete on 05/16 Uterine fibroids  Pt presents with  27WKS, CRAMPING,LOWER ABD PAIN  Pregnancy complicated by Short cervix - steroid complete Uterine fibroids  PMHX  Past Medical History:  Diagnosis Date  . Bacterial infection   . Family history of premature CAD    father  . Fibroids   . Herpes    genital  . Impaired fasting blood sugar   . Miscarriage 2014  . Mixed dyslipidemia   . Ovarian cyst   . Sexually transmitted disease (STD)    hx/o HPV, gonorrhea, chlamydia, genital herpes  . Syphilis 2010  . Yeast infection     PSURG HX  Past Surgical History:  Procedure Laterality Date  . BREAST REDUCTION SURGERY Bilateral 12/04/2015   Procedure: BILATERAL BREAST REDUCTION  WITH LIPOSUCTION;  Surgeon: Wallace Going, DO;  Location: [place];  Service: Plastics;  Laterality: Bilateral;    FAM HX  Family History  Problem Relation Age of Onset  . Heart disease Father 49       died MI  . Hypertension Maternal Grandmother   . Diabetes Maternal Grandmother   . Stroke Maternal Grandmother   . Heart disease Maternal Grandmother   . Hypertension Maternal Grandfather   . COPD Maternal Grandfather   . GER disease Mother   . Deep vein thrombosis Mother        with MVA  . COPD Paternal Grandfather     SOC HX  Social History   Socioeconomic History  . Marital status: Single    Spouse name: Not on file  . Number of  children: Not on file  . Years of education: Not on file  . Highest education level: Not on file  Occupational History  . Not on file  Social Needs  . Financial resource strain: Not on file  . Food insecurity:    Worry: Not on file    Inability: Not on file  . Transportation needs:    Medical: Not on file    Non-medical: Not on file  Tobacco Use  . Smoking status: Never Smoker  . Smokeless tobacco: Never Used  Substance and Sexual Activity  . Alcohol use: Never    Alcohol/week: 0.0 oz    Frequency: Never    Comment: occasionally  . Drug use: No  . Sexual activity: Not Currently     Birth control/protection: None  Lifestyle  . Physical activity:    Days per week: Not on file    Minutes per session: Not on file  . Stress: Not on file  Relationships  . Social connections:    Talks on phone: Not on file    Gets together: Not on file    Attends religious service: Not on file    Active member of club or organization: Not on file    Attends meetings of clubs or organizations: Not on file    Relationship status: Not on file  . Intimate partner violence:    Fear of current or ex partner: Not on file    Emotionally abused: Not on file    Physically abused: Not on file    Forced sexual activity: Not on file  Other Topics Concern  . Not on file  Social History Narrative   Lives alone. No children.  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   Exercise  - some with walking. No significant other.  05/2017    ROS  Review of Systems - as per HPI  [name] SIUP 12/23/17:  Breech SIUP EFW 26% 649 grams  LABS CBC    Component Value Date/Time   WBC 8.3 12/27/2017 1219   RBC 3.73 (L) 12/27/2017 1219   HGB 10.6 (L) 12/27/2017 1219   HGB WILL FOLLOW 03/14/2016 0833   HCT 32.7 (L) 12/27/2017 1219   HCT WILL FOLLOW 03/14/2016 0833   PLT 242 12/27/2017 1219   PLT WILL FOLLOW 03/14/2016 0833   MCV 87.7 12/27/2017 1219   MCV WILL FOLLOW 03/14/2016 0833   MCH 28.4 12/27/2017 1219   MCHC 32.4 12/27/2017 1219   RDW 15.1 12/27/2017 1219   RDW WILL FOLLOW 03/14/2016 0833   LYMPHSABS WILL FOLLOW 03/14/2016 0833   MONOABS 0.5 12/29/2012 0232   EOSABS WILL FOLLOW 03/14/2016 0833   BASOSABS WILL FOLLOW 03/14/2016 2130   Results for orders placed or performed during the hospital encounter of 01/09/18 (from the past 24 hour(s))  Urinalysis, Routine w reflex microscopic     Status: Abnormal   Collection Time: 01/09/18 10:55 AM  Result Value Ref Range   Color, Urine AMBER (A) YELLOW   APPearance HAZY (A) CLEAR   Specific Gravity, Urine 1.020 1.005 - 1.030   pH 6.0 5.0 - 8.0    Glucose, UA NEGATIVE NEGATIVE mg/dL   Hgb urine dipstick NEGATIVE NEGATIVE   Bilirubin Urine NEGATIVE NEGATIVE   Ketones, ur NEGATIVE NEGATIVE mg/dL   Protein, ur 30 (A) NEGATIVE mg/dL   Nitrite NEGATIVE NEGATIVE   Leukocytes, UA SMALL (A) NEGATIVE   RBC / HPF 0-5 0 - 5 RBC/hpf   WBC, UA 11-20 0 - 5 WBC/hpf  Bacteria, UA RARE (A) NONE SEEN   Squamous Epithelial / LPF 21-50 0 - 5   Mucus PRESENT     Gen WDWN  IN NAD ABD Gravid soft NT GU closed, 70%, 0 station Fetal tracings:145 positive accels, variability present      Contractions:None         Uterus gravid and non-tender      Extremities: extremities normal, atraumatic, no cyanosis or edema and no significant edema and no signs of DVT  Most Recent FHR    Most Recent Value  Fetal Heart Rate A  Mode External filed at 01/09/2018 1400  Baseline Rate (A) 145 bpm filed at 01/09/2018 1352  Variability 6-25 BPM filed at 01/09/2018 1352  Accelerations 10 x 10 filed at 01/09/2018 1352  Decelerations None filed at 01/09/2018 1352  Fetal Heart Rate Fetus B  Fetal Heart Rate Fetus C    Most Recent SVE    Most Recent Value  Cervical Exam  Dilation 0 filed at 01/09/2018 1027  Effacement (%) 70 filed at 01/09/2018 1027  Vag. Bleeding None filed at 01/09/2018 [place] 0 filed at 01/09/2018 1027  Exam by: Duanne Limerick cnm filed at 01/09/2018 1027  Vaginal Bleeding  Vag. Bleeding None filed at 01/09/2018 8466  Membranes  Membrane Status Intact filed at 01/09/2018 5993  Amount None filed at 01/09/2018 5701    Assessment: Ms CARLTON SWEANEY is a 32 year old [name] at 27 weeks 4 days who presents c/o constant painful pelvic pressure and RLQ pain. Pt being admitted due to PTL precautions, cervical change since last [name]. -Fetal Well being - BPP5/24=8/8... fetal heart rate is over all reassuring   FWB: Cat 2 fetal tracing.   Todays [name] preliminary results: 01/09/2018, cervix appears funneling to the OS, which is open to 3.1mm with  debris noted. Complete breech with funic presentation.   Plan: Admit to antepartum for observation Continuous toco, intermittent fetal monitoring Bedrest Vaginal progesterone QH Nifedipine 10mg  Q6H for tocolysis VTE proph: SCD Labs: CBC, type & scree Q72H Pain: Tylenol PRN, Fentanyl PRN Re-evaluate in AM. Beta Complete on 12/29/2017 GBS neg 12/17/2017 Will send UC: UA resulted +WBC, +Leuks  MD Pinn aware of plan of care and verbalized agreement.   Mds to follow.   Shadrick Senne

## 2018-01-09 NOTE — MAU Note (Signed)
Discharged from [facility] on bedrest on Friday for short cervix. Was up every hour trying to get comfortable. Increased cramping and pressure. No bleeding, no LOF, +FM

## 2018-01-09 NOTE — MAU Provider Note (Addendum)
History     CSN: [id number]  Arrival date and time: 01/09/18 8527   None     Chief Complaint  Patient presents with  . Abdominal Pain  . Pelvic Pressure   Laura Cross is a 32 year old [name] at 27 weeks 4 days who presents c/o constant painful pelvic pressure and RLQ pain. She was recently admitted and stay for over 1 week  And dx with short cervix measuring 1.4cm with cervical funneling and funic presentation last [name] revieved breech presentation. Pt  was seen just prior to that admissions and admitted a week before for steroids for fetal lung maturity as during routine. Post last admission d/c to immediately had to walk up 10 stairs, pt noted that after that which was x2 days ago pt had constant pelvic pressure and pain in right lower quand. Nothing made the pain better or worse, not relieved by 2 doses of tylenol yesterday.  Pt now endorses pain has resolved.  She presently denies vaginal Bleeding, no leaking of fluid. She notes good fetal movement. Pt denies HA/N/V/D/Rashes?dysurai/No vaginal penetration of truama. Pt endorses remaining on bed rest at home.   Home meds:  TAKE these medications  FERROUS SULFATE PO Take 1 tablet by mouth daily.  NIFEdipine 10 MG capsule Commonly known as:  PROCARDIA Take 1 capsule (10 mg total) by mouth every 6 (six) hours.  PRENATAL GUMMIES/DHA & FA PO Take 2 capsules by mouth daily.  PROGESTERONE [facility] 200 mg vaginally at bedtime.  Pregnancy complicated by Short cervix - steroid complete on 05/16 Uterine fibroids     Past Medical History:  Diagnosis Date  . Bacterial infection   . Family history of premature CAD    father  . Fibroids   . Herpes    genital  . Impaired fasting blood sugar   . Miscarriage 2014  . Mixed dyslipidemia   . Ovarian cyst   . Sexually transmitted disease (STD)    hx/o HPV, gonorrhea, chlamydia, genital herpes  . Syphilis 2010  . Yeast infection     Past Surgical History:  Procedure  Laterality Date  . BREAST REDUCTION SURGERY Bilateral 12/04/2015   Procedure: BILATERAL BREAST REDUCTION  WITH LIPOSUCTION;  Surgeon: Wallace Going, DO;  Location: [facility];  Service: Plastics;  Laterality: Bilateral;    Family History  Problem Relation Age of Onset  . Heart disease Father 40       died MI  . Hypertension Maternal Grandmother   . Diabetes Maternal Grandmother   . Stroke Maternal Grandmother   . Heart disease Maternal Grandmother   . Hypertension Maternal Grandfather   . COPD Maternal Grandfather   . GER disease Mother   . Deep vein thrombosis Mother        with MVA  . COPD Paternal Grandfather     Social History   Tobacco Use  . Smoking status: Never Smoker  . Smokeless tobacco: Never Used  Substance Use Topics  . Alcohol use: Never    Alcohol/week: 0.0 oz    Frequency: Never    Comment: occasionally  . Drug use: No    Allergies:  Allergies  Allergen Reactions  . Meloxicam Other (See Comments)    Bleeding?  Can tolerate other NSAIDs  . Metformin And Related     Diarrhea, hair falling out  . Nuvaring [Etonogestrel-Ethinyl Estradiol]     Flu-like symptoms    Medications Prior to Admission  Medication Sig Dispense Refill  Last Dose  . FERROUS SULFATE PO Take 1 tablet by mouth daily.    Past Week at Unknown time  . NIFEdipine (PROCARDIA) 10 MG capsule Take 1 capsule (10 mg total) by mouth every 6 (six) hours. 90 capsule 2   . Prenatal MV-Min-FA-Omega-3 (PRENATAL GUMMIES/DHA & FA PO) Take 2 capsules by mouth daily.   Past Week at Unknown time  . PROGESTERONE [facility] 200 mg vaginally at bedtime.    12/26/2017 at Unknown time    Review of Systems  Constitutional: Negative.   HENT: Negative.   Eyes: Negative.   Respiratory: Negative.   Cardiovascular: Negative.   Gastrointestinal: Positive for abdominal pain.       Pelvic pressure  Endocrine: Negative.   Genitourinary: Negative.   Musculoskeletal: Negative.   Skin:  Negative.   Allergic/Immunologic: Negative.   Neurological: Negative.   Hematological: Negative.   Psychiatric/Behavioral: Negative.   All other systems reviewed and are negative.  Physical Exam   Blood pressure 129/81, pulse (!) 108, temperature 97.6 F (36.4 C), temperature source Oral, resp. rate 18, height 5\' 1"  (1.549 m), weight 87.5 kg (193 lb), last menstrual period 06/30/2017.  Physical Exam  Nursing note and vitals reviewed. Constitutional: She appears well-developed and well-nourished.  HENT:  Head: Normocephalic and atraumatic.  Eyes: Pupils are equal, round, and reactive to light. Conjunctivae are normal.  Neck: Normal range of motion. Neck supple.  Cardiovascular: Normal rate, regular rhythm and normal heart sounds.  Respiratory: Effort normal and breath sounds normal.  GI: Soft. Bowel sounds are normal.  Genitourinary: Rectum normal and vagina normal. Pelvic exam was performed with patient prone. Cervix exhibits discharge. Cervix exhibits no motion tenderness.  Genitourinary Comments: Uterus gavida equal to dates, no tenderness appreciated.   Dilation: Closed Effacement (%): 70 Station: 0 Exam by:: Deral Schellenberg cnm  Felt baby kick through cervix.   Most Recent SVE    Most Recent Value  Cervical Exam  Dilation 0 filed at 01/09/2018 1027  Effacement (%) 70 filed at 01/09/2018 1027  Vag. Bleeding None filed at 01/09/2018 [place] 0 filed at 01/09/2018 1027  Exam by: Duanne Limerick cnm filed at 01/09/2018 1027  Vaginal Bleeding  Vag. Bleeding None filed at 01/09/2018 0952  Membranes  Membrane Status Intact filed at 01/09/2018 8841  Amount None filed at 01/09/2018 0952   UC: X1-2 cxt on strip for last 20 mins.  FHR: Cat 1 fetal strips, -acells, one x variable to 120 then quick return to baseline, baseline 150s with moderate variability.   MAU Course  Procedures [name]  MDM: [name] by MFM, UA, SVE, NST, Continue outpt management, d/c home if no changes.   Assessment and  Plan   Assessment: SIUP [redacted]w[redacted]d with abdominal and pelvic pain, pt recently d/c home from long hospital stay 2 days ago. Pt stable with no cervical change.   Plan: Monitor: Continuous toco fetal monitoring Nifedipine for tocolysis Continue nightly progesterone  MFM [name]  [name]  Consulted with Dr Alwyn Pea, verbalized agreement with Plan of care.   12:57 PM S:  pt resting quietly in bed in supine positioning. With husband at bedside.   O:  BP 129/81 (BP Location: Left Arm)   Pulse (!) 108   Temp 97.6 F (36.4 C) (Oral)   Resp 18   Ht 5\' 1"  (1.549 m)   Wt 87.5 kg (193 lb)   LMP 06/30/2017   BMI 36.47 kg/m  Results for orders placed or performed during the hospital encounter of  01/09/18 (from the past 24 hour(s))  Urinalysis, Routine w reflex microscopic     Status: Abnormal   Collection Time: 01/09/18 10:55 AM  Result Value Ref Range   Color, Urine AMBER (A) YELLOW   APPearance HAZY (A) CLEAR   Specific Gravity, Urine 1.020 1.005 - 1.030   pH 6.0 5.0 - 8.0   Glucose, UA NEGATIVE NEGATIVE mg/dL   Hgb urine dipstick NEGATIVE NEGATIVE   Bilirubin Urine NEGATIVE NEGATIVE   Ketones, ur NEGATIVE NEGATIVE mg/dL   Protein, ur 30 (A) NEGATIVE mg/dL   Nitrite NEGATIVE NEGATIVE   Leukocytes, UA SMALL (A) NEGATIVE   RBC / HPF 0-5 0 - 5 RBC/hpf   WBC, UA 11-20 0 - 5 WBC/hpf   Bacteria, UA RARE (A) NONE SEEN   Squamous Epithelial / LPF 21-50 0 - 5   Mucus PRESENT   Toco: None, quiet.  FH: Most Recent FHR    Most Recent Value  Fetal Heart Rate A  Mode External filed at 01/09/2018 1230  Baseline Rate (A) 145 bpm filed at 01/09/2018 1230  Variability 6-25 BPM filed at 01/09/2018 1230  Accelerations None filed at 01/09/2018 1230  Decelerations Variable filed at 01/09/2018 1230  Fetal Heart Rate Fetus B  Fetal Heart Rate Fetus C   Cat 2 fetal tracing. Good return to baseline and resolve on own, biggest dip was to 100, x 4 mins long.   Previous US: 01/03/2018, placenta posterior,  funic presentation, breech, cervix 1.4cm in length, funneling of internal os noted.   Todays [name] preliminary results: 01/09/2018, cervix appears funneling to the OS, which is open to 3.70mm with debris noted.   Assessment: SIUP [redacted]w[redacted]d with abdominal and pelvic pain, pt recently d/c home from long hospital stay 2 days ago. Pt stable with no cervical change.   Plan: Monitor: Continuous toco fetal monitoring Nifedipine for tocolysis Continue nightly progesterone  Waiting for [name] final report, MFM [name]   Consulted with Dr Alwyn Pea, verbalized agreement with Plan of care.   Isadore Bokhari 01/09/2018, 10:20 AM

## 2018-01-10 LAB — URINE CULTURE

## 2018-01-10 MED ORDER — CYCLOBENZAPRINE HCL 10 MG PO TABS
10.0000 mg | ORAL_TABLET | Freq: Three times a day (TID) | ORAL | Status: DC | PRN
  Administered 2018-01-10 (×2): 10 mg via ORAL
  Filled 2018-01-10 (×3): qty 1

## 2018-01-10 MED ORDER — MAGNESIUM SULFATE 40 G IN LACTATED RINGERS - SIMPLE
2.0000 g/h | INTRAVENOUS | Status: DC
  Filled 2018-01-10: qty 500

## 2018-01-10 MED ORDER — MAGNESIUM SULFATE BOLUS VIA INFUSION
4.0000 g | Freq: Once | INTRAVENOUS | Status: DC
  Filled 2018-01-10: qty 500

## 2018-01-10 MED ORDER — NIFEDIPINE 10 MG PO CAPS
10.0000 mg | ORAL_CAPSULE | ORAL | Status: DC
  Administered 2018-01-10 – 2018-02-14 (×207): 10 mg via ORAL
  Filled 2018-01-10 (×212): qty 1

## 2018-01-10 NOTE — Progress Notes (Addendum)
Dr. Mancel Bale and [facility], CNM at bedside to evaluate pt and perform cervical exam. New verbal orders received to start mag. Dr. Mancel Bale said to observe the pt's contractions over the next little bit and to hold the mag if her contractions begin to space out.

## 2018-01-10 NOTE — Progress Notes (Addendum)
[name] and 27 wks and 5 days with no cervix and (3.38mm of cervical dilitation) / preterm contractions and funic presentation with infant being breech.   Subjective. Pt resting in bed quietly now, pt endorses having pain and pelvic pressure off and on from time to time. Fentanyl has been helping last dose given 34mins ago. Pt denies any other complaint other than pain in lower right quadrant. Pt understands the POC and denies further questions. Pt denies swelling, HA, vision changes or abdominal pain, no SOB or CP. She denies feeling leakage of fluid or vaginal bleeding. +FM.  Pt appears to be in good spirits and accepts what is best for the baby. Pt remains on continuous monitoring. RN reports pt had several prolonged delcels during the night but never dropped below 100bpm. Decels were corrected by position alteration.   Vitals:   01/09/18 2312 01/10/18 0617 01/10/18 0853 01/10/18 1139  BP:  124/70 129/76 117/81  Pulse: 94 98 94 (!) 107  Resp: 16 18 18 18   Temp: 98.3 F (36.8 C)  98.3 F (36.8 C) 97.6 F (36.4 C)  TempSrc:   Oral Oral  SpO2: 98% 98% 99% 99%  Weight:      Height:        General alert and oriented no distress  Lungs good effort , CTA Bi-lat CV: RRR, no murmurs Abdomen gravid nontender, soft, no rebound tenderness, neg Mcburney, neg merkel,  Ext .Marland Kitchen No edema, no tenderness, neg home, no swelling    Results for orders placed or performed during the hospital encounter of 01/09/18 (from the past 24 hour(s))  Type and screen [place]     Status: None   Collection Time: 01/09/18  3:00 PM  Result Value Ref Range   ABO/RH(D) O POS    Antibody Screen NEG    Sample Expiration      01/12/2018 Performed at [facility], [address], [place], [place] [address]   CBC on admission     Status: None   Collection Time: 01/09/18  3:00 PM  Result Value Ref Range   WBC 6.3 4.0 - 10.5 K/uL   RBC 4.34 3.87 - 5.11 MIL/uL   Hemoglobin 12.0 12.0 - 15.0 g/dL    HCT 37.3 36.0 - 46.0 %   MCV 85.9 78.0 - 100.0 fL   MCH 27.6 26.0 - 34.0 pg   MCHC 32.2 30.0 - 36.0 g/dL   RDW 14.8 11.5 - 15.5 %   Platelets 365 150 - 400 K/uL   Most Recent FHR    Most Recent Value  Fetal Heart Rate A  Mode External filed at 01/10/2018 1300  Baseline Rate (A) 150 bpm filed at 01/10/2018 1300  Variability <5 BPM filed at 01/10/2018 1300  Accelerations 10 x 10 filed at 01/10/2018 1300  Decelerations Variable filed at 01/10/2018 1300  Fetal Heart Rate Fetus B  Fetal Heart Rate Fetus C    Most Recent SVE    Most Recent Value  Cervical Exam  Dilation 0.5 filed at 01/10/2018 1222  Effacement (%) 70 filed at 01/10/2018 1222  Vag. Bleeding None filed at 01/09/2018 [place] 0 filed at 01/09/2018 1027  Exam by: Dr. Mancel Bale filed at 01/10/2018 1222  Vaginal Bleeding  Vag. Bleeding None filed at 01/09/2018 1920  Membranes  Membrane Status Intact filed at 01/09/2018 8144  Amount None filed at 01/10/2018 0805   Cxt: Currently , none, @ 1133 the RN reported contractions that were irregular, with uterine  irritability, Dr. Mancel Bale performed a SVE after contractions and resulted no further cervical dilation.   Previous US: 01/03/2018, placenta posterior, funic presentation, breech, cervix 1.4cm in length, funneling of internal os noted.   Previous US: 01/09/2018, cervix appears funneling to the OS, which is open to 3.93mm with debris noted. Breech, funic presentation.  A/P: [name] and 27 wks and 5 days with no cervix and (3.38mm of cervical dilitation) / preterm contractions and funic presentation with infant being breech. Cat 1 fetal tracing; currently.   1) Continue procardia for tocolysis  2) VTE prophylactics: pt to continue to wear SCD.  3) continue nightly vaginal progesterone  4) Fetal Well being - BPP 5/24= 8/8... fetal heart rate is over all reassuring  5) Breech /Funic presentation- if pt labors plan cesarean section 6) Continue strict bedrest with  trendelenburg position  7) Continue inpatient management for now 8) Round ligament pain: D/C fentanyl; Flexeril/Tyelnol/warm compresses.  9) Contractions: Stopped for now, will monitor, if contraction begin again, will start mag 4g loading dose and 2g/hr for               maintenance  and will d/c the procardia.  11) Beta complete on 12/29/2017 12) GBS neg on 12/17/2017 13) Re-eval in AM   Dr Mancel Bale aware of pt status and verbalized agreement of plan of care.   Md to follow.   [facility], NP-C Assessment occurred at 12:30PM, Charting was late entry.   Agree with above.  I rechecked pt around noon after report of contractions and exam unchanged.  Procardia dosing changed from q6hrs to q4hrs.

## 2018-01-10 NOTE — Progress Notes (Addendum)
[place], CNM to review strip and contraction pattern before starting magnesium. Contractions have spaced out. Was told to hold the mag for now and continue to monitor pt.

## 2018-01-11 NOTE — Progress Notes (Signed)
Hospital day # 2 pregnancy at [redacted]w[redacted]d-preterm labor.  S:  Pt denies contractions, sleep all night. FM+      Perception of contractions: none      Vaginal bleeding: none now       Vaginal discharge:  None and no significant change  O: BP 109/65 (BP Location: Right Arm)   Pulse 91   Temp 98.1 F (36.7 C)   Resp 17   Ht 5\' 1"  (1.549 m)   Wt 87.5 kg (193 lb)   LMP 06/30/2017   SpO2 98%   BMI 36.47 kg/m       Fetal tracings:Fhts 140 variability present, accels,  occ variable      Contractions:   None noted      Uterus gravid and non-tender      Extremities: extremities normal, atraumatic, no cyanosis or edema and no significant edema and no signs of DVT          Labs:  None       Meds: PNV  A: [redacted]w[redacted]d with preterm contractions, breech with funic presentation     stable  P: Continue current plan of care      Upcoming tests/treatments:  None      MDs will follow  Starla Link CNM, MSN 01/11/2018 6:36 AM

## 2018-01-11 NOTE — Progress Notes (Signed)
Antepartum Note  32 y.o. [name] [redacted]w[redacted]d HD#2 admitted for cramping and increased pain.  Patient with known short cervix, funic presentation, breech  Pt currently stable with no c/o. Pain resolved.  She denies contractions, no vaginal bleeding, no leaking of fluid. Reports good FM.   Physical Examination:   Vitals:   01/11/18 0804 01/11/18 1218  BP: 120/73 108/64  Pulse: 93 (!) 116  Resp: 18 18  Temp: 98 F (36.7 C) 98.5 F (36.9 C)  SpO2: 99% 97%   General appearance - alert, well appearing, and in no distress and oriented to person, place, and time Mental status - alert, oriented to person, place, and time  Abd  Soft, gravid, nontender Ex SCDs FHTs  150s, moderate variability accels rare occasional spontaneous variable nadir 100's with return to baseline Toco  none  Cervix: not evaluated   Assessment:  HD#2  [redacted]w[redacted]d with no cervix, funic presentation, breech  Plan: Discussed case with MFM today: -plan is to keep patient as inpatient with weekly ultrasounds for fetal presentation / check funic presentation -If baby turns head down and umbilical cord moves, the patient can be discharged home -If funiform presentation continues the patient will remain in the hospital until term - 37 weeks   Cristle Jared STACIA

## 2018-01-12 LAB — TYPE AND SCREEN
ABO/RH(D): O POS
ANTIBODY SCREEN: NEGATIVE

## 2018-01-12 NOTE — Progress Notes (Addendum)
[name] and 28 wks and 0 days with no cervix and (3.5mm of cervical dilitation) / preterm contractions and funic presentation with infant being breech.   Subjective. Pt resting in bed quietly now, pt endorses having pain and pelvic pressure off and on from time to time. Flexeril and heat pad has helped. Pt denies any other complaint other than pain in lower right quadrant. Pt understands the POC and denies further questions. Pt denies swelling, HA, vision changes or abdominal pain, no SOB or CP. She denies feeling leakage of fluid or vaginal bleeding. +FM. Pt remains on continuous monitoring. RN reports pt had several prolonged delcels during the night but never dropped below 100bpm. Decels were corrected by position alteration. Pt SCD are off now, but RN reported will ut them on after she eats breakfast. Pt now on modified bedrest.   Vitals:   01/11/18 2042 01/11/18 2331 01/12/18 0403 01/12/18 0814  BP: 125/80 111/65 116/73 112/69  Pulse: 90 99 90 91  Resp:  15 16 18   Temp:  98.2 F (36.8 C) 98.7 F (37.1 C) (!) 97.5 F (36.4 C)  TempSrc:  Oral Oral Oral  SpO2:  99% 99% 100%  Weight:      Height:        General alert and oriented no distress  Lungs good effort , CTA Bi-lat CV: RRR, no murmurs Abdomen gravid nontender, soft, no rebound tenderness, neg Mcburney, neg merkel,  Ext .Marland Kitchen No edema, no tenderness, neg home, no swelling    No results found for this or any previous visit (from the past 24 hour(s)).  Most Recent FHR    Most Recent Value  Fetal Heart Rate A  Mode External filed at 01/12/2018 0921  Baseline Rate (A) 145 bpm filed at 01/12/2018 0921  Variability 6-25 BPM filed at 01/12/2018 0921  Accelerations 15 x 15 filed at 01/12/2018 8938  Decelerations Variable filed at 01/12/2018 1017  Multiple birth? N filed at 01/12/2018 5102  Fetal Heart Rate Fetus B  Fetal Heart Rate Fetus C    Most Recent SVE    Most Recent Value  Cervical Exam  Dilation 0.5 filed at  01/10/2018 1222  Effacement (%) 70 filed at 01/10/2018 1222  Vag. Bleeding None filed at 01/12/2018 0730  Station 0 filed at 01/09/2018 1027  Exam by: Dr. Mancel Bale filed at 01/10/2018 1222  Vaginal Bleeding  Vag. Bleeding None filed at 01/12/2018 0730  Membranes  Membrane Status Intact filed at 01/09/2018 5852  Amount None filed at 01/12/2018 0730    Cxt: Currently , none.  Previous US: 01/03/2018, placenta posterior, funic presentation, breech, cervix 1.4cm in length, funneling of internal os noted.   Previous US: 01/09/2018, cervix appears funneling to the OS, which is open to 3.11mm with debris noted. Breech, funic presentation.  Fetal Well being - BPP 5/24= 8/8... fetal heart rate is over all reassuring   A/P: [name] and 28 wks and 0 days with no cervix and (3.71mm of cervical dilitation) / preterm contractions and funic presentation with infant being breech. Cat 1 fetal tracing; currently.   1) Continue procardia Q4H for tocolysis  2) VTE prophylactics: pt to continue to wear SCD.  3) continue nightly vaginal progesterone  5) Breech /Funic presentation- if pt labors plan cesarean section 6) Continue modified bedrest 7) Continue inpatient management for now 8) Round ligament pain: Flexeril/Tyelnol/warm compresses. 11) Beta complete on 12/29/2017 12) GBS neg on 12/17/2017 13) MFM consult: 06/04  -plan is to keep patient as  inpatient with weekly ultrasounds for fetal presentation / check funic presentation  -If baby turns head down and umbilical cord moves, the patient can be discharged home  -If funiform presentation continues the patient will remain in the hospital until term - 37 weeks    Md to follow.   [facility], NP-C Seen and agreed

## 2018-01-13 NOTE — Progress Notes (Signed)
[name] and 28 wks and 1 days with complete cervical effacement and 3.68mm dilated. Baby is breech and with funic presentation.   Subjective. Patient comfortable and feeling well, she is coping with prolonged hospital stay. She denies feeling contractions but there is some uterine irritability noted on the strip. Was able to sleep last night.   Vitals:   01/13/18 0743 01/13/18 1209 01/13/18 1611 01/13/18 1918  BP: 112/65 106/69 130/74 118/66  Pulse: 88 94 91 90  Resp: 18 18 18 18   Temp: 98.7 F (37.1 C) 97.8 F (36.6 C) 98.8 F (37.1 C) 98.5 F (36.9 C)  TempSrc: Oral Oral  Oral  SpO2: 99% 100% 99% 97%  Weight:      Height:       Most Recent FHR    Most Recent Value  Fetal Heart Rate A  Mode External filed at 01/14/2018 0600  Baseline Rate (A) 140 bpm filed at 01/14/2018 0600  Variability 6-25 BPM filed at 01/14/2018 0600  Accelerations None filed at 01/14/2018 0600  Decelerations Variable filed at 01/14/2018 0600  Multiple birth? N filed at 01/12/2018 1442   Fetus continues to have occasional variable and prolonged decels however there is moderate variability and return to baseline after repositioning or spontaneously.   Assessment/Plan: 32 y.o. [name] at 28 wks and 1 days Breech fetus with funic presentation  Continue procardia Q4H for tocolysis  VTE prophylactics: Pt to continue to wear SCD.  Continue nightly vaginal progesterone  Per MFM consult on 06/04  -plan is to keep patient as inpatient with weekly ultrasounds for fetal presentation / check funic  presentation  -If baby turns head down and umbilical cord moves, the patient can be discharged home  -If funiform presentation continues the patient will remain in the hospital until term - 37 weeks   MD to follow

## 2018-01-13 NOTE — Progress Notes (Addendum)
[name] and 28 wks and 1 days with no cervix and (3.60mm of cervical dilitation) / preterm contractions and funic presentation with infant being breech.   Subjective. Pt sleeping now,RN report no distress or concerns.  Pt denies swelling, HA, vision changes or abdominal pain, no SOB or CP. She denies feeling leakage of fluid or vaginal bleeding. +FM. Pt remains on continuous monitoring. RN reports pt had several prolonged delcels during the night but return to baseline. Decels were corrected by position alteration. Pt SCD on now. Pt now on modified bedrest.   Vitals:   01/12/18 1607 01/12/18 2000 01/12/18 2320 01/12/18 2321  BP: 114/66   114/62  Pulse: (!) 109 89  84  Resp: 18   17  Temp: 98.9 F (37.2 C)   97.7 F (36.5 C)  TempSrc: Oral   Oral  SpO2: 99%  98%   Weight:      Height:      Temp:  [97.5 F (36.4 C)-98.9 F (37.2 C)] 97.7 F (36.5 C) (06/05 2321) Pulse Rate:  [84-109] 84 (06/05 2321) Resp:  [16-18] 17 (06/05 2321) BP: (112-122)/(62-73) 114/62 (06/05 2321) SpO2:  [98 %-100 %] 98 % (06/05 2320)  General alert and oriented no distress  Lungs good effort , CTA Bi-lat CV: RRR, no murmurs Abdomen gravid nontender, soft. Ext .Marland Kitchen No edema, no tenderness, neg home, no swelling    Results for orders placed or performed during the hospital encounter of 01/09/18 (from the past 24 hour(s))  Type and screen [place]     Status: None   Collection Time: 01/12/18  1:08 PM  Result Value Ref Range   ABO/RH(D) O POS    Antibody Screen NEG    Sample Expiration      01/15/2018 Performed at [facility], [address], [place], [place] [address]     Most Recent FHR    Most Recent Value  Fetal Heart Rate A  Mode External filed at 01/12/2018 2200  Baseline Rate (A) 150 bpm filed at 01/12/2018 2200  Variability 6-25 BPM filed at 01/12/2018 2200  Accelerations 15 x 15 filed at 01/12/2018 2200  Decelerations Prolonged filed at 01/12/2018 2200  Multiple  birth? N filed at 01/12/2018 1442  Fetal Heart Rate Fetus B  Fetal Heart Rate Fetus C    Most Recent SVE    Most Recent Value  Cervical Exam  Dilation 0.5 filed at 01/10/2018 1222  Effacement (%) 70 filed at 01/10/2018 1222  Vag. Bleeding None filed at 01/12/2018 0730  Station 0 filed at 01/09/2018 1027  Exam by: Dr. Mancel Bale filed at 01/10/2018 1222  Vaginal Bleeding  Vag. Bleeding None filed at 01/12/2018 0730  Membranes  Membrane Status Intact filed at 01/09/2018 1324  Amount None filed at 01/12/2018 0730    Cxt: Currently , none. Uterus irritable.   Previous US: 01/03/2018, placenta posterior, funic presentation, breech, cervix 1.4cm in length, funneling of internal os noted.   Previous US: 01/09/2018, cervix appears funneling to the OS, which is open to 3.52mm with debris noted. Breech, funic presentation.  Fetal Well being - BPP 5/24= 8/8... fetal heart rate is over all reassuring   A/P: [name] and 28 wks and 1 days with no cervix and (3.13mm of cervical dilitation) / preterm contractions and funic presentation with infant being breech. Cat 1 fetal tracing; currently.   1) Continue procardia Q4H for tocolysis  2) VTE prophylactics: pt to continue to wear SCD.  3) continue nightly vaginal  progesterone  5) Breech /Funic presentation- if pt labors plan cesarean section 6) Continue modified bedrest 7) Continue inpatient management for now 8) Round ligament pain: Flexeril/Tyelnol/warm compresses. 11) Beta complete on 12/29/2017 12) GBS neg on 12/17/2017 13) MFM consult: 06/04  -plan is to keep patient as inpatient with weekly ultrasounds for fetal presentation / check funic presentation  -If baby turns head down and umbilical cord moves, the patient can be discharged home  -If funiform presentation continues the patient will remain in the hospital until term - 37 weeks    Md to follow.   [facility], NP-C  I saw and examined patient at bedside and agree with above  findings assessment and plan per [facility].  I reviewed NST at 9am: BL 150, mod variability, reactive.  TOCO: No contractions.  Dr. Alesia Richards. 01/13/2018 @ 1345.

## 2018-01-13 NOTE — Plan of Care (Signed)
  Problem: Activity: Goal: Risk for activity intolerance will decrease Outcome: Progressing   Problem: Nutrition: Goal: Adequate nutrition will be maintained Outcome: Progressing   Problem: Coping: Goal: Level of anxiety will decrease Outcome: Progressing   Problem: Pain Managment: Goal: General experience of comfort will improve Outcome: Progressing   Problem: Safety: Goal: Ability to remain free from injury will improve Outcome: Progressing   Problem: Education: Goal: Knowledge of disease or condition will improve Outcome: Progressing   Problem: Coping: Goal: Level of anxiety will decrease Outcome: Progressing   Problem: Pain Management: Goal: Relief or control of pain will improve Outcome: Progressing

## 2018-01-15 LAB — TYPE AND SCREEN
ABO/RH(D): O POS
Antibody Screen: NEGATIVE

## 2018-01-15 NOTE — Progress Notes (Signed)
Laura Cross 06-19-1986 [redacted]w[redacted]d  S: Hospital day #8 admitted for preterm labor at 28wks. She continues on procardia and sometimes endorses heart racing after dose, but otherwise no medication sx. She says she has felt baby balling up some today, but denies ctx. No LOF or VB. +FM. Feeling well. Has been on bedrest, but coping well. She is thankful to still be pregnant she says.   O: VSS reviewed and stable  A/O NAD  Regular/unlabored breathing  GU - gravid, uterus nontender  GI - soft, nontender  Legs - no calf pain, no edema  Fetus A Non-Stress Test Interpretation for 01/15/18  Indication: CEFM for PTL, breech/funic presentation  Fetal Heart Rate A Mode: External Baseline Rate (A): 135 bpm Variability: Minimal, Moderate Accelerations: 10 x 10 Decelerations: None Multiple birth?: No  Uterine Activity Mode: Toco Contraction Frequency (min): ui; 2 contractions/hour Contraction Duration (sec): 40 Contraction Quality: Mild Resting Tone Palpated: Relaxed Resting Time: Adequate   Reviewed strip and baby has done a few of the prolonged decels over the past 24, but moderate variability and otherwise category I.   A/P: PTL at 28wks. Continue procardia and progesterone. Continue EFM. Continue plan per MFM of weekly [name] and inpatient care until at least 37wks unless breech/funic presentation resolves and PTL does not progress.

## 2018-01-15 NOTE — Progress Notes (Signed)
3 and 1/2 minute deceleration down to 90's at nadir, resolved with position change to left side.

## 2018-01-15 NOTE — Progress Notes (Signed)
5 minute prolonged deceleration down to as low as 100 at nadir, resolved spontaneously without intervention.

## 2018-01-16 NOTE — Progress Notes (Signed)
From 0705-0717, FHR decreased to as low as 110; unable to determine if baseline change versus prolonged decel; back up to baseline of 140 with change of position by another nurse. Will monitor FHR closely for further concerns.

## 2018-01-16 NOTE — Progress Notes (Signed)
Hospital day # 7 pregnancy at [redacted]w[redacted]d  S: well, reports good fetal activity      Contractions:none      Vaginal bleeding:none        Vaginal discharge: none  O: BP 104/61 (BP Location: Left Arm)   Pulse 79   Temp 98.5 F (36.9 C) (Oral)   Resp 17   Ht 5\' 1"  (1.549 m)   Wt 193 lb (87.5 kg)   LMP 06/30/2017   SpO2 98%   BMI 36.47 kg/m       Fetal tracings:reviewed and reassuring      Uterus consistent with 28 weeks and non-tender      Extremities: no significant edema and no signs of DVT  A: [redacted]w[redacted]d with breech and funic presentation with shortened cervix     stable  P: continue current plan of care  [place] 01/16/2018 12:07 PM

## 2018-01-16 NOTE — Progress Notes (Signed)
Unwitnessed 4 minute prolonged deceleration down to 115 at nadir.Recovered spontaneously without intervention.

## 2018-01-16 NOTE — Progress Notes (Signed)
4 minute decel, down to 90 at lowest point, resolved without intervention.

## 2018-01-16 NOTE — Progress Notes (Signed)
6 minute prolonged deceleration down to 120 at nadir, resolved spontaneously without intervention.

## 2018-01-17 LAB — GLUCOSE TOLERANCE, 1 HOUR: Glucose, 1 Hour GTT: 148 mg/dL — ABNORMAL HIGH (ref 70–140)

## 2018-01-17 NOTE — Progress Notes (Signed)
5 minute prolonged decel down to 95 at nadir; resolved without intervention. Now reassuring in the 135 range.

## 2018-01-17 NOTE — Plan of Care (Signed)
  Problem: Pain Managment: Goal: General experience of comfort will improve Outcome: Progressing   Problem: Safety: Goal: Ability to remain free from injury will improve Outcome: Progressing   Problem: Pain Management: Goal: Relief or control of pain will improve Outcome: Progressing

## 2018-01-17 NOTE — Progress Notes (Signed)
Prolonged decel down to 100 at nadir, lasted about 4 minutes, now back up to baseline of 135.

## 2018-01-17 NOTE — Progress Notes (Signed)
Initial Nutrition Assessment  DOCUMENTATION CODES:   Obesity unspecified  INTERVENTION:  Regular Diet May order double protein portions, snacks TID and from retail  NUTRITION DIAGNOSIS:   Increased nutrient needs related to (pregnancy and fetal growth requirements) as evidenced by (28 weeks IUP).  GOAL:   Patient will meet greater than or equal to 90% of their needs   MONITOR:   Weight trends  REASON FOR ASSESSMENT:   Antenatal   ASSESSMENT:   28 5/7 weeks, incompetent cervix, PTL. Pre-preg weight 191 lbs, BMI 36.2. 5 lbs weight loss when compared to last admission. Hx of elevated A1C, 1hour GTT wnl at 14 weeks. Todays 1 hour GTT 148.  Pt with marginal appetite, reports feeling full all the time. Encouraged small freq meals. Snack menu provided. Also left copy of GDM diet education in the room in case needed in future.  Encouraged pt to moderate conc sweet consumption    Diet Order:   Diet Order           Diet regular Room service appropriate? Yes; Fluid consistency: Thin  Diet effective now          EDUCATION NEEDS:   No education needs have been identified at this time  Skin:  Skin Assessment: Reviewed RN Assessment   Height:   Ht Readings from Last 1 Encounters:  01/09/18 5\' 1"  (1.549 m)    Weight:   Wt Readings from Last 1 Encounters:  01/09/18 193 lb (87.5 kg)    Ideal Body Weight:     BMI:  Body mass index is 36.47 kg/m.  Estimated Nutritional Needs:   Kcal:  2000-2200  Protein:  90-100 g  Fluid:  2.3 L    Weyman Rodney M.Fredderick Severance LDN Neonatal Nutrition Support Specialist/RD III Pager [phone]      Phone [phone]

## 2018-01-17 NOTE — Progress Notes (Signed)
Follow up visit with [facility], who I met during her most recent hospitalization.  She shared that she was feeling discouraged today because she just found out that she didn't pass her glucose test.  We discussed how that just feels like one more thing on top of everything she's dealing with, but she maintained an optimistic outlook and said she'll have a lot to tell Terald Sleeper when she's born.  She's grateful that even though she's in the hospital, she's not on total bedrest and allowed to get up to go to the bathroom.  Will continue to follow.  Please page as further needs arise.  Donald Prose. Elyn Peers, M.Div. [facility] Chaplain Pager [phone] Office [phone]

## 2018-01-17 NOTE — Progress Notes (Addendum)
Hospital day # 8 pregnancy at [redacted]w[redacted]d  S: well, reports good fetal activity      Contractions:none      Vaginal bleeding:none now       Vaginal discharge: no significant change  O: BP 111/71 (BP Location: Right Arm)   Pulse 79   Temp 98.2 F (36.8 C) (Oral)   Resp 17   Ht 5\' 1"  (1.549 m)   Wt 193 lb (87.5 kg)   LMP 06/30/2017   SpO2 97%   BMI 36.47 kg/m       Fetal tracings:reviewed and reassuring      Uterus consistent with 28 weeks and non-tender      Extremities: extremities normal, atraumatic, no cyanosis or edema and no significant edema and no signs of DVT  A: [redacted]w[redacted]d with funic breech presentation, short cervix     stable  P: continue current plan of care  Bertram Gala Clemmons CNM 01/17/2018 5:28 AM  Seen and agreed

## 2018-01-18 LAB — TYPE AND SCREEN
ABO/RH(D): O POS
ANTIBODY SCREEN: NEGATIVE

## 2018-01-18 NOTE — Progress Notes (Signed)
[name] and 28 wks and 6 days with no cervix and (3.50mm of cervical dilitation) / preterm contractions and funic presentation with infant being breech.   Subjective. Pt in bed resting , awake ,RN report no distress or concerns.  Pt denies swelling, HA, vision changes or abdominal pain, no SOB or CP. She denies feeling leakage of fluid or vaginal bleeding. +FM. Pt remains on continuous monitoring. RN reports pt had several prolonged delcels during the night but return to baseline. Decels spontaneously resolved. Pt SCDoff now, pt was out of bed to bathroom and tolerated well, RN will replace them. Pt now on modified bedrest.   Vitals:   01/17/18 1948 01/17/18 2357 01/18/18 0417 01/18/18 0756  BP: 115/65 110/63 (!) 100/51 106/70  Pulse: 80 78 85 79  Resp: 16 18 16 18   Temp: 98.1 F (36.7 C) 98.3 F (36.8 C) 97.9 F (36.6 C) 98.9 F (37.2 C)  TempSrc: Oral Oral Oral Oral  SpO2: 99% 99% 95% 100%  Weight:      Height:      Temp:  [97.8 F (36.6 C)-98.9 F (37.2 C)] 98.9 F (37.2 C) (06/11 0756) Pulse Rate:  [78-85] 79 (06/11 0756) Resp:  [16-18] 18 (06/11 0756) BP: (100-115)/(51-70) 106/70 (06/11 0756) SpO2:  [95 %-100 %] 100 % (06/11 0756)  General alert and oriented no distress  Lungs good effort , CTA Bi-lat CV: RRR, no murmurs Abdomen gravid nontender, soft. Ext .Marland Kitchen No edema, no tenderness, neg home, no swelling    No results found for this or any previous visit (from the past 24 hour(s)).   Most Recent FHR    Most Recent Value  Fetal Heart Rate A  Mode External filed at 01/18/2018 0800  Baseline Rate (A) 140 bpm filed at 01/18/2018 0800  Variability 6-25 BPM filed at 01/18/2018 0800  Accelerations 10 x 10 filed at 01/18/2018 0800  Decelerations None filed at 01/18/2018 0800  Multiple birth? N filed at 01/12/2018 1442  Fetal Heart Rate Fetus B  Fetal Heart Rate Fetus C   Laura Cross 11-17-1985 [redacted]w[redacted]d  Fetus A Non-Stress Test Interpretation for  01/18/18  Indication: breec, funic presentation, PTL  Fetal Heart Rate A Mode: External Baseline Rate (A): 140 bpm Variability: Moderate Accelerations: 10 x 10 Decelerations: None Multiple birth?: No  Uterine Activity Mode: Toco Contraction Frequency (min): UI Contraction Duration (sec): 40-50 Contraction Quality: Mild Resting Tone Palpated: Relaxed Resting Time: Adequate   Most Recent SVE    Most Recent Value  Cervical Exam  Dilation 0.5 filed at 01/10/2018 1222  Effacement (%) 70 filed at 01/10/2018 1222  Vag. Bleeding None filed at 01/17/2018 2004  Station 0 filed at 01/09/2018 1027  Exam by: Dr. Mancel Bale filed at 01/10/2018 1222  Vaginal Bleeding  Vag. Bleeding None filed at 01/17/2018 2004  Membranes  Membrane Status Intact filed at 01/17/2018 2004  Color Clear filed at 01/17/2018 0800  Odor Normal filed at 01/16/2018 0526  Amount Small filed at 01/16/2018 0526   Cxt: Currently , none. Uterus irritable at times.   Previous US: 01/03/2018, placenta posterior, funic presentation, breech, cervix 1.4cm in length, funneling of internal os noted.   Previous US: 01/09/2018, cervix appears funneling to the OS, which is open to 3.54mm with debris noted. Breech, funic presentation.  Fetal Well being - BPP 5/24= 8/8... fetal heart rate is over all reassuring   A/P: LOS: 9; [name] and 28 wks and 6days with no cervix and (3.76mm of cervical dilitation) /  preterm contractions and funic presentation with infant being breech. Cat 1 fetal tracing; currently. Pt stable.   1) Continue procardia Q4H for tocolysis  2) VTE prophylactics: pt to continue to wear SCD.  3) continue nightly vaginal progesterone  5) Breech /Funic presentation- if pt labors plan cesarean section 6) Continue modified bedrest 7) Continue inpatient management for now 8) Round ligament pain: Flexeril/Tyelnol/warm compresses. 11) Beta complete on 12/29/2017 12) GBS neg on 12/17/2017 13) GTT Scheduled.   14) MFM consult: 06/04  -plan is to keep patient as inpatient with weekly ultrasounds for fetal presentation / check funic presentation  -If baby turns head down and umbilical cord moves, the patient can be discharged home  -If funiform presentation continues the patient will remain in the hospital until term - 37 weeks    Md to follow.   [facility], NP-C Dr. Cletis Media was in room to assess with me and agrees with plan of care.

## 2018-01-18 NOTE — Progress Notes (Signed)
Off the monitor for a shower.

## 2018-01-18 NOTE — Progress Notes (Signed)
Patient finished with shower.

## 2018-01-19 LAB — GLUCOSE, 1 HOUR GESTATIONAL: Glucose Tolerance, 1 hour: 174 mg/dL (ref 70–189)

## 2018-01-19 LAB — GLUCOSE, FASTING GESTATIONAL: GLUCOSE, FASTING-GESTATIONAL: 85 mg/dL

## 2018-01-19 LAB — GLUCOSE, 2 HOUR GESTATIONAL: Glucose Tolerance, 2 hour: 152 mg/dL (ref 70–164)

## 2018-01-19 LAB — GLUCOSE, 3 HOUR GESTATIONAL: Glucose, GTT - 3 Hour: 137 mg/dL (ref 70–144)

## 2018-01-19 NOTE — Progress Notes (Signed)
Hospital day # 10 pregnancy at [redacted]w[redacted]d-- preterm contraction breech with funic presentation  S: Doing well, FM+.  No complaints.        Perception of contractions: none      Vaginal bleeding: none now and None       Vaginal discharge:  no significant change  O: BP 95/61 (BP Location: Left Arm)   Pulse 90   Temp 98.2 F (36.8 C)   Resp 18   Ht 5\' 1"  (1.549 m)   Wt 87.5 kg (193 lb)   LMP 06/30/2017   SpO2 99%   BMI 36.47 kg/m       Fetal tracings:Reactive  BL 140 variability present occ variable decel,.      Contractions:   None      Uterus gravid and non-tender      Extremities: extremities normal, atraumatic, no cyanosis or edema and no significant edema and no signs of DVT          Labs:  None       Meds: PNV  A:  [name] and 28 wks and 6days with no cervix and (3.87mm of cervical dilitation) / preterm contractions and funic presentation with infant being breech. Cat 1 fetal tracing; currently. Pt stable       stable  P: Continue current plan of care      Upcoming tests/treatments:  [name] on 01/21/2018   Starla Link CNM, MSN 01/19/2018 9:47 AM

## 2018-01-20 NOTE — Progress Notes (Signed)
Pt without complaints.  No leakage of fluid or VB.  Good FM  BP (!) 105/55 (BP Location: Right Arm)   Pulse 89   Temp 98.1 F (36.7 C) (Oral)   Resp 16   Ht 5\' 1"  (1.549 m)   Wt 87.5 kg (193 lb)   LMP 06/30/2017   SpO2 98%   BMI 36.47 kg/m   FHTS Baseline: 130 bpm, Variability: Good {> 6 bpm) and Accelerations: Reactive  Toco none  Pt in NAD CV RRR Lungs CTAB abd  Gravid soft and NT GU no vb EXt no calf tenderness Results for orders placed or performed during the hospital encounter of 01/09/18 (from the past 72 hour(s))  Type and screen [place]     Status: None   Collection Time: 01/18/18  2:24 PM  Result Value Ref Range   ABO/RH(D) O POS    Antibody Screen NEG    Sample Expiration      01/21/2018 Performed at [facility], [address], [place], [place] [address]   Glucose, fasting gestational     Status: None   Collection Time: 01/19/18  5:33 AM  Result Value Ref Range   Glucose, Fasting-Gestational 85 mg/dL    Comment: Performed at [facility], [address], [place], [place] [address]  Glucose, 1 hour gestational     Status: None   Collection Time: 01/19/18  6:46 AM  Result Value Ref Range   Glucose, 1 Hour-Gestational 174 70 - 189 mg/dL    Comment: Performed at [facility], [address], [place], [place] [address]  Glucose, 2 hour gestational     Status: None   Collection Time: 01/19/18  7:42 AM  Result Value Ref Range   Glucose, 2 Hour-Gestational 152 70 - 164 mg/dL    Comment: Performed at [facility], [address] [place],  [address]  Glucose, 3 hour gestational     Status: None   Collection Time: 01/19/18  8:41 AM  Result Value Ref Range   Glucose, GTT - 3 Hour 137 70 - 144 mg/dL    Comment: Performed at [facility], [address] [place],  [address]    Assessment and Plan [redacted]w[redacted]d  Breech with shortened cervix Funic presentation FHTS reassuring [name] tomorrow Normal three hour  GTT   Patient ID: Laura Cross, female   DOB: [birth year], 32 y.o.   MRN: [id number]

## 2018-01-21 ENCOUNTER — Encounter (HOSPITAL_COMMUNITY): Payer: Self-pay

## 2018-01-21 ENCOUNTER — Inpatient Hospital Stay (HOSPITAL_COMMUNITY): Payer: BLUE CROSS/BLUE SHIELD

## 2018-01-21 ENCOUNTER — Ambulatory Visit (HOSPITAL_COMMUNITY)
Admission: RE | Admit: 2018-01-21 | Discharge: 2018-01-21 | Disposition: A | Discharge status: Home / Self Care | Payer: BLUE CROSS/BLUE SHIELD | Source: Ambulatory Visit | Attending: Obstetrics & Gynecology | Admitting: Obstetrics & Gynecology

## 2018-01-21 LAB — TYPE AND SCREEN
ABO/RH(D): O POS
ANTIBODY SCREEN: NEGATIVE

## 2018-01-21 MED ORDER — TETANUS-DIPHTH-ACELL PERTUSSIS 5-2.5-18.5 LF-MCG/0.5 IM SUSP
0.5000 mL | Freq: Once | INTRAMUSCULAR | Status: AC
  Administered 2018-01-21: 0.5 mL via INTRAMUSCULAR
  Filled 2018-01-21: qty 0.5

## 2018-01-21 NOTE — Progress Notes (Addendum)
[name] and 29 wks and 2 days with no cervix and (3.65mm of cervical dilitation) / preterm contractions and funic presentation with infant being breech.   Subjective. Pt in bed resting , awake with BF at bedside ,RN report no distress or concerns.  Pt denies swelling, HA, vision changes or abdominal pain, no SOB or CP. She denies feeling leakage of fluid or vaginal bleeding. +FM. Pt remains on continuous monitoring. RN reports pt had several prolonged delcels during the night but return to baseline. Decels either  spontaneously resolved or position change resolved them. Pt SCDoff now, pt was out of bed to bathroom and tolerated well, RN will replace them. Pt now on modified bedrest. Pt expresses concern about non moving around to much and the effects that will have when she goes home. Pt stated yesterday when she took a shower she felt like she was so weak she was going fall flat on her face.   Vitals:   01/20/18 2335 01/21/18 0345 01/21/18 0345 01/21/18 0801  BP: (!) 97/56 (!) 111/59 (!) 111/59 100/61  Pulse: 80  79 84  Resp: 17  18 18   Temp: 98.1 F (36.7 C)  98.2 F (36.8 C) 98 F (36.7 C)  TempSrc: Oral  Oral Oral  SpO2: 98%  99% 99%  Weight:      Height:      Temp:  [97.7 F (36.5 C)-98.4 F (36.9 C)] 98 F (36.7 C) (06/14 0801) Pulse Rate:  [77-95] 84 (06/14 0801) Resp:  [16-18] 18 (06/14 0801) BP: (97-111)/(56-66) 100/61 (06/14 0801) SpO2:  [95 %-100 %] 99 % (06/14 0801)  General alert and oriented no distress  Lungs good effort , CTA Bi-lat CV: RRR, no murmurs Abdomen gravid nontender, soft. Ext .Marland Kitchen No edema, no tenderness, neg home, no swelling    Results for orders placed or performed during the hospital encounter of 01/09/18 (from the past 24 hour(s))  Type and screen [place]     Status: None   Collection Time: 01/21/18  5:17 AM  Result Value Ref Range   ABO/RH(D) O POS    Antibody Screen NEG    Sample Expiration      01/24/2018 Performed at  [facility], [address] [place], [place] [address]     EDUARDO WURTH Sep 18, 1985 [redacted]w[redacted]d  Fetus A Non-Stress Test Interpretation for 01/21/18  Indication: breec, funic presentation, PTL  Fetal Heart Rate A Mode: External Baseline Rate (A): 135 bpm Variability: Moderate Accelerations: 10 x 10 Decelerations: Prolonged, Variable Multiple birth?: No  Uterine Activity Mode: Toco Contraction Frequency (min): UI Contraction Duration (sec): 40-50 Contraction Quality: Mild Resting Tone Palpated: Relaxed Resting Time: Adequate   Most Recent FHR    Most Recent Value  Fetal Heart Rate A  Mode External filed at 01/21/2018 0700  Baseline Rate (A) 135 bpm filed at 01/21/2018 0700  Variability 6-25 BPM filed at 01/21/2018 0700  Accelerations 10 x 10 filed at 01/21/2018 0700  Decelerations Prolonged, Variable filed at 01/21/2018 0700  Multiple birth? N filed at 01/12/2018 1442  Fetal Heart Rate Fetus B  Fetal Heart Rate Fetus C    Most Recent SVE    Most Recent Value  Cervical Exam  Dilation 0.5 filed at 01/10/2018 1222  Effacement (%) 70 filed at 01/10/2018 1222  Vag. Bleeding None filed at 01/20/2018 [place] 0 filed at 01/09/2018 1027  Exam by: Dr. Mancel Bale filed at 01/10/2018 1222  Vaginal Bleeding  Vag. Bleeding None filed  at 01/20/2018 1915  Membranes  Membrane Status Intact filed at 01/19/2018 1953  Color Clear filed at 01/18/2018 0800  Odor Normal filed at 01/18/2018 0800  Amount None filed at 01/19/2018 0820    Cxt: Currently , none. Uterus irritable at times.   Previous US: 01/03/2018, placenta posterior, funic presentation, breech, cervix 1.4cm in length, funneling of internal os noted.   Previous US: 01/09/2018, cervix appears funneling to the OS, which is open to 3.64mm with debris noted. Breech, funic presentation.  Fetal Well being - BPP 5/24= 8/8... fetal heart rate is over all reassuring   GBS negative on 05/10 Beta complete on  05/22 GTT Negative on 06/12  A/P:  LOS # 12; [name] and 29 wks and 2 days with no cervix and (3.33mm of cervical dilitation) / preterm contractions and funic presentation with infant being breech. Cat 1 fetal tracing; currently. Pt stable.   1) Continue procardia Q4H for tocolysis  2) VTE prophylactics: pt to continue to wear SCD.  3) continue nightly vaginal progesterone  4) Continue modified bedrest 5) Continue inpatient management for now  6) [name] to be done today.   7) MFM consult: 06/04  -plan is to keep patient as inpatient with weekly ultrasounds for fetal presentation / check funic presentation  -If baby turns head down and umbilical cord moves, the patient can be discharged home  -If funiform presentation continues the patient will remain in the hospital until term - 37 weeks    Md to follow.   [facility], NP-C  I agree with above findings, assessment and plan as per NP [facility]. NST reviewed at 1015 am for an hour: 140 BL, mod variability, reactive.  Three Prolonged decel to 120s each for 2 to 4 minutes.  TOCO: with irritability,no distinct contractions.  Dr. Alesia Richards. 01/21/2018 @ 1117am.

## 2018-01-21 NOTE — Progress Notes (Addendum)
[name] and 29 wks and 2 days with no cervix and (3.84mm of cervical dilitation) / preterm contractions and funic presentation with infant being breech.   Subjective. Pt in bed resting awake, I was called by the RN @ 1700 for a 8 min long prolonged decel, that was corrected by position change. Decel lowest point was 80s, then return to 145 baseline. At that time the tracing was a Cat 2.   Vitals:   01/21/18 0345 01/21/18 0801 01/21/18 1146 01/21/18 1623  BP: (!) 111/59 100/61 113/61 113/67  Pulse: 79 84 93 82  Resp: 18 18 18 18   Temp: 98.2 F (36.8 C) 98 F (36.7 C) 98.5 F (36.9 C) 98.2 F (36.8 C)  TempSrc: Oral Oral Oral Oral  SpO2: 99% 99% 98% 100%  Weight:      Height:      Temp:  [98 F (36.7 C)-98.5 F (36.9 C)] 98.2 F (36.8 C) (06/14 1623) Pulse Rate:  [77-93] 82 (06/14 1623) Resp:  [17-18] 18 (06/14 1623) BP: (97-113)/(56-67) 113/67 (06/14 1623) SpO2:  [98 %-100 %] 100 % (06/14 1623)  General alert and oriented no distress     Results for orders placed or performed during the hospital encounter of 01/09/18 (from the past 24 hour(s))  Type and screen [place]     Status: None   Collection Time: 01/21/18  5:17 AM  Result Value Ref Range   ABO/RH(D) O POS    Antibody Screen NEG    Sample Expiration      01/24/2018 Performed at [facility], [address] [place], [place] [address]     Laura Cross 1985-09-02 [redacted]w[redacted]d  Fetus A Non-Stress Test Interpretation for 01/21/18  Indication: breec, funic presentation, PTL  Fetal Heart Rate A Mode: External Baseline Rate (A): 145 bpm Variability: Minimal, Moderate Accelerations: 10 x 10 Decelerations: Variable, Prolonged Multiple birth?: No  Uterine Activity Mode: Toco Contraction Frequency (min): ui Contraction Duration (sec): 40-50 Contraction Quality: Mild Resting Tone Palpated: Relaxed Resting Time: Adequate   Most Recent FHR    Most Recent Value  Fetal Heart Rate A   Mode External filed at 01/21/2018 1700  Baseline Rate (A) 145 bpm filed at 01/21/2018 1700  Variability <5 BPM, 6-25 BPM filed at 01/21/2018 1700  Accelerations 10 x 10 filed at 01/21/2018 1700  Decelerations Variable, Prolonged filed at 01/21/2018 1700  Multiple birth? N filed at 01/12/2018 1442  Fetal Heart Rate Fetus B  Fetal Heart Rate Fetus C    Most Recent SVE    Most Recent Value  Cervical Exam  Dilation 0.5 filed at 01/10/2018 1222  Effacement (%) 70 filed at 01/10/2018 1222  Vag. Bleeding None filed at 01/20/2018 [place] 0 filed at 01/09/2018 1027  Exam by: Dr. Mancel Bale filed at 01/10/2018 1222  Vaginal Bleeding  Vag. Bleeding None filed at 01/20/2018 1915  Membranes  Membrane Status Intact filed at 01/19/2018 1953  Color Clear filed at 01/18/2018 0800  Odor Normal filed at 01/18/2018 0800  Amount None filed at 01/21/2018 0840    Cxt: Currently , none. Uterus irritable at times.   Previous US: 01/03/2018, placenta posterior, funic presentation, breech, cervix 1.4cm in length, funneling of internal os noted.   Previous US: 01/09/2018, cervix appears funneling to the OS, which is open to 3.68mm with debris noted. Breech, funic presentation.  [name] in 01/21/2018: Impression  SIUP at 29+2 weeks  Footling breech presentation  Normal interval anatomy; anatomic survey complete  Normal amniotic fluid volume  Appropriate interval growth with EFW at the 40th %tile  EV views of cervix: wide U-shaped funneling down to external  os; difficult to determine amount of cervical dilation;  membranes have prolapsed into cervix but not into vagina; no  significant change since the study of 06/02 ---------------------------------------------------------------------- Recommendations  Follow-up as clinically indicated  Fetal Well being - BPP 5/24= 8/8... fetal heart rate is over all reassuring   GBS negative on 05/10 Beta complete on 05/22 GTT Negative on 06/12 TDAP  01/21/2018  A/P:  LOS # 12; [name] and 29 wks and 2 days with no cervix and (3.41mm of cervical dilitation) / preterm contractions and funic presentation with infant being breech. Uterine fibroids, obesity.  Cat 1 fetal tracing; currently. Pt stable.   1) Continue procardia Q4H for tocolysis  2) VTE prophylactics: pt to continue to wear SCD.  3) continue nightly vaginal progesterone  4) Continue modified bedrest 5) Continue inpatient management for now  6) [name] to be done today. No change.   7) MFM consult: 06/04  -plan is to keep patient as inpatient with weekly ultrasounds for fetal presentation / check funic presentation  -If baby turns head down and umbilical cord moves, the patient can be discharged home  -If funiform presentation continues the patient will remain in the hospital until term - 37 weeks    Md to follow.   [facility], NP-C  I agree with above findings, assessment and plan as per NP [facility]. NST reviewed at 1015 am for an hour: 140 BL, mod variability, reactive.  Three Prolonged decel to 120s each for 2 to 4 minutes.  TOCO: with irritability,no distinct contractions.  Dr. Alesia Richards. 01/21/2018 @ 1117am.

## 2018-01-22 NOTE — Progress Notes (Signed)
[name] and 29 wks and 3 days with no cervix and (3.12mm of cervical dilitation) / preterm contractions and funic presentation with infant being breech.   Subjective Pt without complaints.  No changes.  Good fetal movement. Occasional contractions.  Vitals:   01/21/18 1944 01/21/18 2339 01/22/18 0410 01/22/18 0757  BP: 105/63 120/70 97/62 (!) 97/58  Pulse: 90 81 79 88  Resp: 17 18 16 18   Temp: 98.4 F (36.9 C) 97.9 F (36.6 C) 98.3 F (36.8 C) 97.9 F (36.6 C)  TempSrc: Oral Oral Oral Oral  SpO2: 100% 100% 100% 100%  Weight:      Height:      Temp:  [97.9 F (36.6 C)-98.5 F (36.9 C)] 97.9 F (36.6 C) (06/15 0757) Pulse Rate:  [79-93] 88 (06/15 0757) Resp:  [16-18] 18 (06/15 0757) BP: (97-120)/(58-70) 97/58 (06/15 0757) SpO2:  [98 %-100 %] 100 % (06/15 0757)  General alert and oriented no distress  Abdomen gravid nontender, soft. Ext .Marland Kitchen No edema, no tenderness.  SCDs not on    No results found for this or any previous visit (from the past 24 hour(s)).  Laura Cross 10/24/1985 [redacted]w[redacted]d  Fetus A Non-Stress Test Interpretation for 01/22/18  Indication: breec, funic presentation, PTL  +accelerations, good variability, prolonged decelerations intermittently with good return to baseline.  Ultrasound: D/w pt. Cervix unchanged.  Membranes down to external os. Breech presentation.  A/P:  LOS # 13; [name] and 29 wks and 3 days with no cervix and (3.66mm of cervical dilitation) / preterm contractions and funic presentation with infant being breech. Cat 1 fetal tracing; currently. Pt stable.   1) Continue procardia Q4H for tocolysis  2) VTE prophylactics: pt to continue to wear SCD.  3) continue nightly vaginal progesterone  4) Continue modified bedrest 5) Continue inpatient management for now  6) [name] to be done today.   7) MFM consult: 06/04  -plan is to keep patient as inpatient with weekly ultrasounds for fetal presentation / check funic presentation  -If baby turns  head down and umbilical cord moves, the patient can be discharged home  -If funiform presentation continues the patient will remain in the hospital until term - 37 weeks    F/u ultrasounds per previous MFM recommendations.

## 2018-01-22 NOTE — Progress Notes (Signed)
Called by RN to review pts strip, pt had several prolonged decels, resolved with postion change, pt currently in left side. Baseline 140s No acels +prolonged decels X3 over last several hours moderate var Cat 2  Will update MD MDs to follow.   Shaunessy Dobratz 3:37 AM

## 2018-01-22 NOTE — Progress Notes (Signed)
[name] and 29 wks and 2 days with no cervix and (3.48mm of cervical dilitation) / preterm contractions and funic presentation with infant being breech.   Subjective:  Patient states she is taking it day by day. Worried because of the decels reported last night. Reassured patient of good variability and return to baseline after each decel.   Objective:  Vitals:   01/21/18 1944 01/21/18 2339 01/22/18 0410 01/22/18 0757  BP: 105/63 120/70 97/62 (!) 97/58  Pulse: 90 81 79 88  Resp: 17 18 16 18   Temp: 98.4 F (36.9 C) 97.9 F (36.6 C) 98.3 F (36.8 C) 97.9 F (36.6 C)  TempSrc: Oral Oral Oral Oral  SpO2: 100% 100% 100% 100%  Weight:      Height:        Most Recent FHR    Most Recent Value  Fetal Heart Rate A  Mode External filed at 01/22/2018 0900  Baseline Rate (A) 140 bpm filed at 01/22/2018 0900  Variability <5 BPM, 6-25 BPM filed at 01/22/2018 0900  Accelerations 10 x 10 filed at 01/22/2018 0900  Decelerations Variable filed at 01/22/2018 0900  Multiple birth? N filed at 01/12/2018 1442  Fetal Heart Rate Fetus B  Fetal Heart Rate Fetus C    Most Recent SVE    Most Recent Value  Cervical Exam  Dilation 0.5 filed at 01/10/2018 1222  Effacement (%) 70 filed at 01/10/2018 1222  Vag. Bleeding None filed at 01/20/2018 [place] 0 filed at 01/09/2018 1027  Exam by: Dr. Mancel Bale filed at 01/10/2018 1222  Vaginal Bleeding  Vag. Bleeding None filed at 01/20/2018 1915  Membranes  Membrane Status Intact filed at 01/21/2018 2000  Color Clear filed at 01/18/2018 0800  Odor Normal filed at 01/18/2018 0800  Amount None filed at 01/21/2018 0840   [name] in 01/21/2018: Impression  SIUP at 29+2 weeks  Footling breech presentation  Normal interval anatomy; anatomic survey complete  Normal amniotic fluid volume  Appropriate interval growth with EFW at the 40th %tile  EV views of cervix: wide U-shaped funneling down to external  os; difficult to determine amount of cervical  dilation;  membranes have prolapsed into cervix but not into vagina; no  significant change since the study of 06/02 ---------------------------------------------------------------------- Recommendations  Follow-up as clinically indicated  Fetal Well being - BPP 5/24= 8/8... fetal heart rate is over all reassuring   GBS negative on 05/10 Beta complete on 05/22 GTT Negative on 06/12 TDAP 01/21/2018  Assessment:  32 y.o. G1P0 ay 29 wks and3  days with no cervix and (3.44mm of cervical dilitation) Funic presentation with infant being breech Uterine fibroids Obesity Category 1 FHTs  Plan: -Continue procardia Q4H for tocolysis  -VTE prophylactics: pt to continue to wear SCD.  -Continue nightly vaginal progesterone  -Continue modified bedrest -Repeat U/S with BPP on 01/28/18  -Per MFM consult: 06/04  -plan is to keep patient as inpatient with weekly ultrasounds for fetal presentation / check funic               presentation  -If baby turns head down and umbilical cord moves, the patient can be discharged home  -If funiform presentation continues the patient will remain in the hospital until term - 37 weeks    MD to follow  [name] 9:35 AM 01/22/18

## 2018-01-23 MED ORDER — COMPLETENATE 29-1 MG PO CHEW
1.0000 | CHEWABLE_TABLET | Freq: Every day | ORAL | Status: DC
  Administered 2018-01-23 – 2018-02-13 (×21): 1 via ORAL
  Filled 2018-01-23 (×23): qty 1

## 2018-01-23 MED ORDER — FAMOTIDINE 20 MG PO TABS
20.0000 mg | ORAL_TABLET | Freq: Every day | ORAL | Status: DC
  Administered 2018-01-23 – 2018-02-13 (×22): 20 mg via ORAL
  Filled 2018-01-23 (×22): qty 1

## 2018-01-23 NOTE — Progress Notes (Addendum)
Late entry.  Pt seen at 12 noon.  [name] and 29 wks and 4 days with no cervix and (3.73mm of cervical dilitation) / preterm contractions and funic presentation with infant being breech.   Subjective Pt without complaints.  No changes.  Good fetal movement. Occasional contractions.  Vitals:   01/23/18 0811 01/23/18 1131 01/23/18 1600 01/23/18 2004  BP: 102/62 (!) 95/51 116/60 110/67  Pulse: 87 92 66 84  Resp: 18 18 18 17   Temp: 97.9 F (36.6 C) 97.7 F (36.5 C) 97.6 F (36.4 C) (!) 97.5 F (36.4 C)  TempSrc: Oral Oral Oral Oral  SpO2: 99% 100% 99% 100%  Weight:      Height:      Temp:  [97.5 F (36.4 C)-98.4 F (36.9 C)] 97.5 F (36.4 C) (06/16 2004) Pulse Rate:  [62-92] 84 (06/16 2004) Resp:  [15-18] 17 (06/16 2004) BP: (90-120)/(51-67) 110/67 (06/16 2004) SpO2:  [98 %-100 %] 100 % (06/16 2004)  General alert and oriented no distress  Abdomen gravid nontender, soft. Ext .Marland Kitchen No edema, no tenderness.  SCDs not on    No results found for this or any previous visit (from the past 24 hour(s)).  Laura Cross Jun 12, 1986 [redacted]w[redacted]d  Fetus A Non-Stress Test Interpretation for 01/23/18  Indication: breec, funic presentation, PTL  +accelerations, good variability, prolonged decelerations intermittently with good return to baseline.  Ultrasound 01/21/18: D/w pt. Cervix unchanged.  Membranes down to external os. Breech presentation. I personally reviewed ultrasound images.  Cord is in between feet.  A/P:  LOS # 14; [name] and 29 wks and 3 days with no cervix and (3.30mm of cervical dilitation) / preterm contractions and funic presentation with infant being breech. Cat 1 fetal tracing; currently. Pt stable.    Prolonged fetal decelerations but overall reassuring. Continue current management.

## 2018-01-23 NOTE — Progress Notes (Signed)
Prolonged decel around 5 minutes in length, difficult to trace because of break in tracing. When I arrived in room around 1510. I turned patient to right side and FHR was found in the 130 range. Now back up to baseline of 135.

## 2018-01-23 NOTE — Progress Notes (Signed)
3 minute unwitnessed prolonged deceleration, briefly as low as 90 at nadir, now back up to baseline of 135, resolved without intervention.

## 2018-01-23 NOTE — Progress Notes (Signed)
[name] and 29 wks and 4 days with no cervix and (3.20mm of cervical dilitation) / preterm contractions and funic presentation with infant being breech. Fibroids.   Subjective. Pt in bed resting awake,pt endorses wanting a shower today. Pt in NAD, denies any question and spirits are high. RN reports no distress or concerns.  Pt denies swelling, HA, vision changes or abdominal pain, no SOB or CP. She denies feeling leakage of fluid or vaginal bleeding. +FM. Pt remains on continuous monitoring. RN reports pt had several prolonged delcels during the night but return to baseline. Decels either  spontaneously resolved or position change resolved them. Pt SCDoff now, pt was out of bed to bathroom and tolerated well, RN will replace them. Pt now on modified bedrest.    Vitals:   01/23/18 0010 01/23/18 0408 01/23/18 0811 01/23/18 1131  BP: (!) 90/56 120/63 102/62 (!) 95/51  Pulse: 87 62 87 92  Resp: 15 18 18 18   Temp: 98.4 F (36.9 C) 97.8 F (36.6 C) 97.9 F (36.6 C) 97.7 F (36.5 C)  TempSrc: Oral Oral Oral Oral  SpO2: 98% 100% 99% 100%  Weight:      Height:      Temp:  [97.7 F (36.5 C)-98.4 F (36.9 C)] 97.7 F (36.5 C) (06/16 1131) Pulse Rate:  [62-92] 92 (06/16 1131) Resp:  [15-18] 18 (06/16 1131) BP: (90-120)/(51-63) 95/51 (06/16 1131) SpO2:  [98 %-100 %] 100 % (06/16 1131)  General alert and oriented no distress  Lungs good effort , CTA Bi-lat CV: RRR, no murmurs Abdomen gravid nontender, soft. Ext .Marland Kitchen No edema, no tenderness, neg home, no swelling    No results found for this or any previous visit (from the past 24 hour(s)).  COLINE CALKIN 1986-07-20 [redacted]w[redacted]d  Fetus A Non-Stress Test Interpretation for 01/23/18  Indication: breec, funic presentation, PTL  Fetal Heart Rate A Mode: External Baseline Rate (A): 140 bpm Variability: Moderate Accelerations: 15 x 15 Decelerations: Variable Multiple birth?: No  Uterine Activity Mode: Toco Contraction Frequency (min):  UI Contraction Duration (sec): 50-60 Contraction Quality: Mild Resting Tone Palpated: Relaxed Resting Time: Adequate   Most Recent FHR    Most Recent Value  Fetal Heart Rate A  Mode External filed at 01/23/2018 1200  Baseline Rate (A) 140 bpm filed at 01/23/2018 1200  Variability 6-25 BPM filed at 01/23/2018 1200  Accelerations 15 x 15 filed at 01/23/2018 1200  Decelerations Variable filed at 01/23/2018 1200  Multiple birth? N filed at 01/12/2018 1442  Fetal Heart Rate Fetus B  Fetal Heart Rate Fetus C    Most Recent SVE    Most Recent Value  Cervical Exam  Dilation 0.5 filed at 01/10/2018 1222  Effacement (%) 70 filed at 01/10/2018 1222  Vag. Bleeding None filed at 01/20/2018 [place] 0 filed at 01/09/2018 1027  Exam by: Dr. Mancel Bale filed at 01/10/2018 1222  Vaginal Bleeding  Vag. Bleeding None filed at 01/20/2018 1915  Membranes  Membrane Status Intact filed at 01/21/2018 2000  Color Clear filed at 01/18/2018 0800  Odor Normal filed at 01/18/2018 0800  Amount None filed at 01/21/2018 0840    Cxt: Currently , none. Uterus irritable at times.   Previous US: 01/03/2018, placenta posterior, funic presentation, breech, cervix 1.4cm in length, funneling of internal os noted.   Previous US: 01/09/2018, cervix appears funneling to the OS, which is open to 3.40mm with debris noted. Breech, funic presentation.  [name] in 01/21/2018: Impression  SIUP at 29+2 weeks  Footling breech presentation  Normal interval anatomy; anatomic survey complete  Normal amniotic fluid volume  Appropriate interval growth with EFW at the 40th %tile  EV views of cervix: wide U-shaped funneling down to external  os; difficult to determine amount of cervical dilation;  membranes have prolapsed into cervix but not into vagina; no  significant change since the study of 06/02 ---------------------------------------------------------------------- Recommendations  Follow-up as clinically  indicated  Fetal Well being - BPP 5/24= 8/8... fetal heart rate is over all reassuring   GBS negative on 05/10 Beta complete on 05/22 GTT Negative on 06/12 TDAP 01/21/2018  A/P:  LOS # 14; [name] and 29 wks and 2 days with no cervix and (3.69mm of cervical dilitation) / preterm contractions and funic presentation with infant being breech. Uterine fibroids, obesity.  Cat 1 fetal tracing; currently. Pt stable.   1) Continue procardia Q4H for tocolysis  2) VTE prophylactics: pt to continue to wear SCD.  3) continue nightly vaginal progesterone  4) Continue modified bedrest 5) Continue inpatient management for now  6) [name] to be done today. No change.   7) MFM consult: 06/04  -plan is to keep patient as inpatient with weekly ultrasounds for fetal presentation / check funic presentation  -If baby turns head down and umbilical cord moves, the patient can be discharged home  -If funiform presentation continues the patient will remain in the hospital until term - 37 weeks    Md to follow. Dr Margie Billet to be updated on pt status.   [facility], NP-C

## 2018-01-24 LAB — TYPE AND SCREEN
ABO/RH(D): O POS
Antibody Screen: NEGATIVE

## 2018-01-24 NOTE — Progress Notes (Signed)
[name] and 29 wks and 5 days with no cervix and (3.55mm of cervical dilitation) / preterm contractions and funic presentation with infant being breech.   Subjective:  Patient managing with modified bedrest, allowed to shower. Reported no contractions today.   Objective:  Vitals:   01/23/18 2004 01/23/18 2323 01/24/18 0330 01/24/18 0857  BP: 110/67 97/63 96/70  (!) 105/58  Pulse: 84 87 78 86  Resp: 17 16 15 18   Temp: (!) 97.5 F (36.4 C) 97.9 F (36.6 C) 97.9 F (36.6 C) 98.1 F (36.7 C)  TempSrc: Oral Oral Oral Oral  SpO2: 100% 99% 100% 99%  Weight:      Height:       Most Recent FHR    Most Recent Value  Fetal Heart Rate A  Mode External filed at 01/24/2018 0900  Baseline Rate (A) 140 bpm filed at 01/24/2018 0900  Variability <5 BPM, 6-25 BPM filed at 01/24/2018 0900  Accelerations 15 x 15 filed at 01/24/2018 0900  Decelerations Variable filed at 01/24/2018 0900  Multiple birth? N filed at 01/12/2018 1442  Fetal Heart Rate Fetus B  Fetal Heart Rate Fetus C    Most Recent SVE    Most Recent Value  Cervical Exam  Dilation 0.5 filed at 01/10/2018 1222  Effacement (%) 70 filed at 01/10/2018 1222  Vag. Bleeding None filed at 01/20/2018 [place] 0 filed at 01/09/2018 1027  Exam by: Dr. Mancel Bale filed at 01/10/2018 1222  Vaginal Bleeding  Vag. Bleeding None filed at 01/20/2018 1915  Membranes  Membrane Status Intact filed at 01/21/2018 2000  Color Clear filed at 01/18/2018 0800  Odor Normal filed at 01/18/2018 0800  Amount None filed at 01/21/2018 0840       [name] in 01/21/2018: Impression  SIUP at 29+2 weeks  Footling breech presentation  Normal interval anatomy; anatomic survey complete  Normal amniotic fluid volume  Appropriate interval growth with EFW at the 40th %tile  EV views of cervix: wide U-shaped funneling down to external  os; difficult to determine amount of cervical dilation;  membranes have prolapsed into cervix but not into vagina; no  significant change since the study of 06/02 ---------------------------------------------------------------------- Recommendations  Follow-up as clinically indicated  Fetal Well being - BPP 5/24= 8/8... fetal heart rate is over all reassuring   GBS negative on 05/10 Beta complete on 05/22 GTT Negative on 06/12 TDAP 01/21/2018  Assessment:  32 y.o. G1P0 ay 29 wks and 5  days with no cervix and (3.93mm of cervical dilitation) Funic presentation with infant being breech Uterine fibroids Obesity Category 1 FHTs  Plan: -Continue procardia Q4H for tocolysis  -VTE prophylactics: pt to continue to wear SCD.  -Continue nightly vaginal progesterone  -Continue modified bedrest -Repeat U/S with BPP on 01/28/18  -Per MFM consult: 06/04  -plan is to keep patient as inpatient with weekly ultrasounds for fetal presentation / check funic               presentation  -If baby turns head down and umbilical cord moves, the patient can be discharged home  -If funiform presentation continues the patient will remain in the hospital until term - 37 weeks    MD to follow  [name] 10:21 AM 01/24/18

## 2018-01-25 NOTE — Progress Notes (Addendum)
Hospital day # 16 pregnancy at [redacted]w[redacted]d with breech and funic presentation; shortened cervix  S:  Doing well. No complaints      Perception of contractions: few      Vaginal bleeding: none        Vaginal discharge:  no significant change  O: BP 98/62 (BP Location: Right Arm)   Pulse 80   Temp 98 F (36.7 C) (Oral)   Resp 16   Ht 5\' 1"  (1.549 m)   Wt 193 lb (87.5 kg)   LMP 06/30/2017   SpO2 100%   BMI 36.47 kg/m       Fetal tracings:      Contractions:         Uterus consistent with 29+6 weeks and non-tender      Extremities: no significant edema and no signs of DVT          Labs:  No results found for this or any previous visit (from the past 24 hour(s)).       Meds:  Current Facility-Administered Medications:  .  0.9 %  sodium chloride infusion, 250 mL, Intravenous, PRN, Noralyn Pick, [employer] .  acetaminophen (TYLENOL) tablet 650 mg, 650 mg, Oral, Q4H PRN, [place], [place], FNP, 650 mg at 01/22/18 0426 .  calcium carbonate (TUMS - dosed in mg elemental calcium) chewable tablet 400 mg of elemental calcium, 2 tablet, Oral, Q4H PRN, [place], Jade, FNP, 400 mg of elemental calcium at 01/23/18 1218 .  cyclobenzaprine (FLEXERIL) tablet 10 mg, 10 mg, Oral, Q8H PRN, Everett Graff, MD, 10 mg at 01/10/18 2036 .  docusate sodium (COLACE) capsule 100 mg, 100 mg, Oral, Daily, [place], [place], [place], 100 mg at 01/24/18 1034 .  famotidine (PEPCID) tablet 20 mg, 20 mg, Oral, Daily, [place], [place], [place], 20 mg at 01/24/18 1034 .  lactated ringers infusion, , Intravenous, Continuous, [place], [place], [place], Last Rate: 125 mL/hr at 01/17/18 2236 .  NIFEdipine (PROCARDIA) capsule 10 mg, 10 mg, Oral, Q4H, [place], [place], FNP, 10 mg at 01/25/18 0751 .  prenatal vitamin w/FE, FA (NATACHEW) chewable tablet 1 tablet, 1 tablet, Oral, Q1200, [place], [place], FNP, 1 tablet at 01/24/18 1234 .  progesterone (PROMETRIUM) capsule 200 mg, 200 mg, Vaginal, QHS, [place], [place], FNP, 200 mg at 01/24/18 2210 .  sodium chloride flush  (NS) 0.9 % injection 3 mL, 3 mL, Intravenous, Q12H, [place], [place], FNP, 3 mL at 01/24/18 2219 .  sodium chloride flush (NS) 0.9 % injection 3 mL, 3 mL, Intravenous, PRN, [place], Jade, FNP, 3 mL at 01/24/18 2210 .  zolpidem (AMBIEN) tablet 5 mg, 5 mg, Oral, QHS PRN, [place], Jade, FNP, 5 mg at 01/12/18 2255   A: [redacted]w[redacted]d with shortened cervix, breech with funic presentation     stable  P: Continue current plan of care      Upcoming tests/treatments:  Ultrasound weekly      MDs will follow  [place], [place] 01/25/2018 9:17 AM   Pt seen and examined.  Agree with above

## 2018-01-26 NOTE — Progress Notes (Signed)
[name] and 30 wks and 0 days with no cervix and (3.36mm of cervical dilitation) / preterm contractions and funic presentation with infant being breech.   Subjective. Pt in bed resting quietly, in NAD and without complaints. Spirits still standing in good shape. + fetal movement, denies HA, swelling, CP/SOB/N.   Vitals:   01/25/18 2034 01/25/18 2310 01/26/18 0500 01/26/18 0816  BP: 110/71 114/63 118/87 126/73  Pulse: 86 93 93 76  Resp: 17 18 18 18   Temp: 98.3 F (36.8 C) 98 F (36.7 C) 98.2 F (36.8 C) 97.7 F (36.5 C)  TempSrc: Oral Oral Oral Oral  SpO2: 100% 100% 100% 99%  Weight:      Height:      Temp:  [97.7 F (36.5 C)-98.3 F (36.8 C)] 97.7 F (36.5 C) (06/19 0816) Pulse Rate:  [76-93] 76 (06/19 0816) Resp:  [17-20] 18 (06/19 0816) BP: (102-126)/(63-87) 126/73 (06/19 0816) SpO2:  [99 %-100 %] 99 % (06/19 0816)  General alert and oriented no distress  Lungs good effort , CTA Bi-lat CV: RRR, no murmurs Abdomen gravid nontender, soft. Ext .Marland Kitchen No edema, no tenderness, neg home, no swelling   No results found for this or any previous visit (from the past 24 hour(s)).  AMERICUS SCHEURICH February 17, 1986 [redacted]w[redacted]d  Fetus A Non-Stress Test Interpretation for 01/26/18  Indication: breec, funic presentation, PTL  Fetal Heart Rate A Mode: External Baseline Rate (A): 140 bpm Variability: Moderate Accelerations: 15 x 15 Decelerations: Variable Multiple birth?: No  Uterine Activity Mode: Toco Contraction Frequency (min): UI Contraction Duration (sec): 40 Contraction Quality: Mild Resting Tone Palpated: Relaxed Resting Time: Adequate  NST: Baseline 130s, >6bpm with moderate variability, positive acels, several prolonged decels last 4-7 mins throughout last night shifts, correct with position change. Cat 1 currently.   Most Recent FHR    Most Recent Value  Fetal Heart Rate A  Mode External filed at 01/26/2018 0900  Baseline Rate (A) 140 bpm filed at 01/26/2018 0900   Variability 6-25 BPM filed at 01/26/2018 0900  Accelerations 15 x 15 filed at 01/26/2018 0900  Decelerations Variable, Prolonged filed at 01/26/2018 0900  Multiple birth? N filed at 01/12/2018 1442  Fetal Heart Rate Fetus B  Fetal Heart Rate Fetus C    Most Recent SVE    Most Recent Value  Cervical Exam  Dilation 0.5 filed at 01/10/2018 1222  Effacement (%) 70 filed at 01/10/2018 1222  Vag. Bleeding None filed at 01/26/2018 0800  Station 0 filed at 01/09/2018 1027  Exam by: Dr. Mancel Bale filed at 01/10/2018 1222  Vaginal Bleeding  Vag. Bleeding None filed at 01/26/2018 0800  Membranes  Membrane Status Intact filed at 01/24/2018 1000  Color Clear filed at 01/25/2018 1950  Odor Normal filed at 01/25/2018 1950  Amount Small filed at 01/25/2018 1950    Cxt: Currently , none. Uterus irritable at times.   Previous US: 01/03/2018, placenta posterior, funic presentation, breech, cervix 1.4cm in length, funneling of internal os noted.   Previous US: 01/09/2018, cervix appears funneling to the OS, which is open to 3.37mm with debris noted. Breech, funic presentation.  [name] in 01/21/2018: Impression  SIUP at 29+2 weeks  Footling breech presentation  Normal interval anatomy; anatomic survey complete  Normal amniotic fluid volume  Appropriate interval growth with EFW at the 40th %tile  EV views of cervix: wide U-shaped funneling down to external  os; difficult to determine amount of cervical dilation;  membranes have prolapsed into cervix but not into  vagina; no  significant change since the study of 06/02 ---------------------------------------------------------------------- Recommendations  Follow-up as clinically indicated  Fetal Well being - BPP 5/24= 8/8... fetal heart rate is over all reassuring   GBS negative on 05/10 Beta complete on 05/22 GTT Negative on 06/12 TDAP 01/21/2018  A/P:  LOS # 17; [name] and 30 wks and 0 days with no cervix and (3.84mm of cervical  dilitation) / preterm contractions and funic presentation with infant being breech. Uterine fibroids, obesity.  Cat 1 fetal tracing; currently. Pt stable.   1) Continue procardia Q4H for tocolysis  2) VTE prophylactics: pt to continue to wear SCD.  3) continue nightly vaginal progesterone  4) Continue modified bedrest 5) Continue inpatient management for now 6) Showers PRN.  7) MFM consult: 06/04  -plan is to keep patient as inpatient with weekly ultrasounds for fetal presentation / check funic presentation  -If baby turns head down and umbilical cord moves, the patient can be discharged home  -If funiform presentation continues the patient will remain in the hospital until term - 37 weeks    Md to follow.  Dr Alwyn Pea, updated on pts status.   [facility], NP-C

## 2018-01-26 NOTE — Progress Notes (Addendum)
RN called to inform me that ms flemming's has a prolonged decel x 6 mins, down to 90s then position change resolved fetal heart rhythm back to baseline of 150s. Moderate variability. Cat 2 at that time. Rn will monitor. No acels.  Cxt: None Mds to follow-will update.

## 2018-01-27 LAB — TYPE AND SCREEN
ABO/RH(D): O POS
ANTIBODY SCREEN: NEGATIVE

## 2018-01-27 NOTE — Progress Notes (Addendum)
[name] and 30 wks and 1 days with no cervix and (3.92mm of cervical dilitation) / preterm contractions and funic presentation with infant being breech.   Subjective:  Patient managing with modified bedrest, allowed to shower. Reported no contractions today.    Objective:  Vitals:   01/26/18 2001 01/26/18 2343 01/27/18 0601 01/27/18 0737  BP: 115/62 98/60 (!) 103/57 101/62  Pulse: 88 85 81 79  Resp: 17 16 16 18   Temp: 98.2 F (36.8 C) 98.1 F (36.7 C) 98.9 F (37.2 C) 98.3 F (36.8 C)  TempSrc: Oral Oral  Oral  SpO2: 99% 98% 99% 99%  Weight:      Height:       Most Recent FHR    Most Recent Value  Fetal Heart Rate A  Mode External filed at 01/27/2018 0700  Baseline Rate (A) 130 bpm filed at 01/27/2018 0700  Variability 6-25 BPM filed at 01/27/2018 0700  Accelerations 15 x 15 filed at 01/27/2018 0700  Decelerations Prolonged filed at 01/27/2018 0700  Multiple birth? N filed at 01/12/2018 1442  Fetal Heart Rate Fetus B  Fetal Heart Rate Fetus C    Most Recent SVE    Most Recent Value  Cervical Exam  Dilation 0.5 filed at 01/10/2018 1222  Effacement (%) 70 filed at 01/10/2018 1222  Vag. Bleeding None filed at 01/26/2018 0800  Station 0 filed at 01/09/2018 1027  Exam by: Dr. Mancel Bale filed at 01/10/2018 1222  Vaginal Bleeding  Vag. Bleeding None filed at 01/26/2018 0800  Membranes  Membrane Status Intact filed at 01/24/2018 1000  Color Clear filed at 01/25/2018 1950  Odor Normal filed at 01/25/2018 1950  Amount Small filed at 01/25/2018 1950   [name] in 01/21/2018: Impression  SIUP at 29+2 weeks  Footling breech presentation  Normal interval anatomy; anatomic survey complete  Normal amniotic fluid volume  Appropriate interval growth with EFW at the 40th %tile  EV views of cervix: wide U-shaped funneling down to external  os; difficult to determine amount of cervical dilation;  membranes have prolapsed into cervix but not into vagina; no  significant change since the  study of 06/02 ----------------------------------------------------------------------  GBS negative on 05/10 Beta complete on 05/22 GTT Negative on 06/12 TDAP 01/21/2018  Assessment:  32 y.o. G1P0 at 30 wks and 1  days  Funic and breech presentation  Uterine fibroids Obesity Category 1 FHTs  Plan: -Continue procardia Q4H for tocolysis  -VTE prophylactics: Pt to continue to wear SCD.  -Continue nightly vaginal progesterone  -Continue modified bedrest -Repeat U/S with BPP on 01/28/18  -Per MFM consult: 06/04  -plan is to keep patient as inpatient with weekly ultrasounds for fetal presentation / check funic               presentation  -If baby turns head down and umbilical cord moves, the patient can be discharged home  -If funiform presentation continues the patient will remain in the hospital until term - 37 weeks    MD to follow  [name] 8:46 AM 01/27/18   Seen and agreed

## 2018-01-27 NOTE — Progress Notes (Signed)
Pt off monitor to go take a shower. Received verbal approval from Dr. Cletis Media.

## 2018-01-28 ENCOUNTER — Inpatient Hospital Stay (HOSPITAL_COMMUNITY): Payer: BLUE CROSS/BLUE SHIELD

## 2018-01-28 NOTE — Progress Notes (Addendum)
Hospital day # 19 pregnancy at [redacted]w[redacted]d  S: well, reports good fetal activity      Contractions:none      Vaginal bleeding:none now and none       Vaginal discharge: no significant change  O: BP (!) 100/59 (BP Location: Left Arm)   Pulse 89   Temp 99 F (37.2 C) (Oral)   Resp 18   Ht 5\' 1"  (1.549 m)   Wt 193 lb (87.5 kg)   LMP 06/30/2017   SpO2 100%   BMI 36.47 kg/m       Fetal tracings:reviewed and reassuring      Uterus consistent with 30 weeks and non-tender      Extremities: no significant edema and no signs of DVT  A: [redacted]w[redacted]d with short cervix; breech funic presentation     unchanged  P: continue current plan of care  Bertram Gala Clemmons CNM 01/28/2018 2:09 PM  I saw patient at bedside and agree with above findings, assessment and plan per CNM Clemmons, Lori.  Dr. Alesia Richards.

## 2018-01-28 NOTE — Plan of Care (Signed)
Patient is alert and oriented x4. She is pleasant and cooperative. Patient ambulates to the bathroom without any difficulty. She continues on fetal monitoring.

## 2018-01-29 NOTE — Plan of Care (Signed)
Patient is alert and oriented x4. Denies any pain or discomfort at this time. Continues on continuous fetal monitoring. Patient is feeling baby move often.

## 2018-01-29 NOTE — Progress Notes (Addendum)
Hospital day # 20 pregnancy at [redacted]w[redacted]d  S: well, reports good fetal activity      Contractions:none      Vaginal bleeding:none now and none       Vaginal discharge: no significant change  O: BP (!) 116/55 (BP Location: Right Arm)   Pulse 83   Temp 98 F (36.7 C) (Oral)   Resp 16   Ht 5\' 1"  (1.549 m)   Wt 193 lb (87.5 kg)   LMP 06/30/2017   SpO2 100%   BMI 36.47 kg/m       Fetal tracings:reviewed and reassuring      Uterus consistent with 30 weeks and non-tender      Extremities: no significant edema and no signs of DVT  A: [redacted]w[redacted]d with short cervix; breech funic presentation     unchanged  P: continue current plan of care  Cecille Rubin A Clemmons CNM 01/29/2018 10:00 AM  Pt allowed to go for a wheelchair ride outside once a day per Dr. Cletis Media  ADDENDUM:  Reviewed NST at 0910 am: 140 BL, mod variability, reactive, cat 1.  TOCO: + irritability. C/w monitoring.  Dr. Alesia Richards. 01/29/18 @ 1228pm.

## 2018-01-30 LAB — TYPE AND SCREEN
ABO/RH(D): O POS
Antibody Screen: NEGATIVE

## 2018-01-30 NOTE — Plan of Care (Signed)
Patient is resting in bed at this time. She denies any pain or discomfort. Continues on continuous fetal monitoring.

## 2018-01-30 NOTE — Progress Notes (Addendum)
Hospital day # 21 pregnancy at [redacted]w[redacted]d  S: well, reports good fetal activity      Contractions:none      Vaginal bleeding:none now and none       Vaginal discharge: no significant change  O: BP 100/60 (BP Location: Right Arm)   Pulse 85   Temp 98.2 F (36.8 C) (Oral)   Resp 16   Ht 5\' 1"  (1.549 m)   Wt 193 lb (87.5 kg)   LMP 06/30/2017   SpO2 100%   BMI 36.47 kg/m       Fetal tracings:reviewed and reassuring      Uterus consistent with 30 weeks and non-tender      Extremities: no significant edema and no signs of DVT  A: [redacted]w[redacted]d with short cervix; breech funic presentation     unchanged  P: continue current plan of care  Bertram Gala Clemmons CNM 01/30/2018 9:13 AM  Pt allowed to go for a wheelchair ride outside once a day per Dr. Cletis Media Seen and agreed

## 2018-01-31 NOTE — Progress Notes (Addendum)
LOS# 22 [name] and 30 wks and 5 days with no cervix and (3.75mm of cervical dilitation) / preterm contractions and funic presentation with infant being breech.   Subjective. Pt in bed resting quietly, in NAD and without complaints. Spirits still standing in good shape. + fetal movement, no vaginal discharge, no vaginal bleeding, denies feeling contractions, denies HA, swelling, CP/SOB/N.   Vitals:   01/30/18 2333 01/31/18 0409 01/31/18 0747 01/31/18 1126  BP: (!) 106/58 (!) 84/53 (!) 102/50 113/74  Pulse: 73 96 84 (!) 103  Resp: 18 18 16 18   Temp: 99.2 F (37.3 C) 98.6 F (37 C) 98 F (36.7 C) 98.4 F (36.9 C)  TempSrc: Oral Oral Oral Oral  SpO2: 100% 100% 100% 100%  Weight:      Height:      Temp:  [97.7 F (36.5 C)-99.2 F (37.3 C)] 98.4 F (36.9 C) (06/24 1126) Pulse Rate:  [73-103] 103 (06/24 1126) Resp:  [16-19] 18 (06/24 1126) BP: (84-124)/(39-74) 113/74 (06/24 1126) SpO2:  [99 %-100 %] 100 % (06/24 1126)  General alert and oriented no distress  Lungs good effort , CTA Bi-lat CV: RRR, no murmurs Abdomen gravid nontender, soft. Ext .Marland Kitchen No edema, no tenderness, neg home, no swelling   No results found for this or any previous visit (from the past 24 hour(s)).  KELLENE MCCLEARY May 17, 1986 [redacted]w[redacted]d  Fetus A Non-Stress Test Interpretation for 01/31/18  Indication: breec, funic presentation, PTL  Fetal Heart Rate A Mode: External Baseline Rate (A): 140 bpm Variability: Minimal, Moderate Accelerations: 10 x 10 Decelerations: Variable Multiple birth?: No  Uterine Activity Mode: Toco Contraction Frequency (min): occassional  Contraction Duration (sec): 50-90 Contraction Quality: Mild Resting Tone Palpated: Relaxed Resting Time: Adequate  NST: Baseline 140s, >6bpm with moderate variability, positive acels, several prolonged decels  throughout last nights shift, correcedt with position change. Cat 1 currently.   Most Recent FHR    Most Recent Value  Fetal Heart  Rate A  Mode External filed at 01/31/2018 0900  Baseline Rate (A) 140 bpm filed at 01/31/2018 0900  Variability <5 BPM, 6-25 BPM filed at 01/31/2018 0900  Accelerations 10 x 10 filed at 01/31/2018 0900  Decelerations Variable filed at 01/31/2018 0900  Multiple birth? N filed at 01/12/2018 1442  Fetal Heart Rate Fetus B  Fetal Heart Rate Fetus C    Most Recent SVE    Most Recent Value  Cervical Exam  Dilation 0.5 filed at 01/10/2018 1222  Effacement (%) 70 filed at 01/10/2018 1222  Vag. Bleeding None filed at 01/31/2018 [place] 0 filed at 01/09/2018 1027  Exam by: Dr. Mancel Bale filed at 01/10/2018 1222  Vaginal Bleeding  Vag. Bleeding None filed at 01/31/2018 0751  Membranes  Membrane Status Intact filed at 01/28/2018 2006  Color Clear filed at 01/27/2018 2000  Odor Normal filed at 01/31/2018 0751  Amount Scant filed at 01/31/2018 0751   Cxt: Currently , none. Uterus irritable at times.   Previous US: 01/03/2018, placenta posterior, funic presentation, breech, cervix 1.4cm in length, funneling of internal os noted.   Previous US: 01/09/2018, cervix appears funneling to the OS, which is open to 3.51mm with debris noted. Breech, funic presentation.  [name] in 01/21/2018: Impression  SIUP at 29+2 weeks  Footling breech presentation  Normal interval anatomy; anatomic survey complete  Normal amniotic fluid volume  Appropriate interval growth with EFW at the 40th %tile  EV views of cervix: wide U-shaped funneling down to external  os; difficult  to determine amount of cervical dilation;  membranes have prolapsed into cervix but not into vagina; no  significant change since the study of 06/02 ---------------------------------------------------------------------- Recommendations  Follow-up as clinically indicated  Fetal Well being - BPP 5/24= 8/8... fetal heart rate is over all reassuring   GBS negative on 05/10 Beta complete on 05/22 GTT Negative on 06/12 TDAP  01/21/2018  A/P:  LOS # 22; [name] and 30 wks and 5 days with no cervix and (3.62mm of cervical dilitation) / preterm contractions and funic presentation with infant being breech. Uterine fibroids, obesity.  Cat 1 fetal tracing; currently. Pt stable.   1) Continue procardia Q4H for tocolysis  2) VTE prophylactics: pt to continue to wear SCD.  3) continue nightly vaginal progesterone  4) Continue modified bedrest 5) Continue inpatient management for now 6) Showers PRN.  7) Pt allowed to go for a wheelchair ride outside once a day per Dr. Cletis Media     Seen and agreed  8) MFM consult: 06/04  -plan is to keep patient as inpatient with weekly ultrasounds for fetal presentation / check funic presentation  -If baby turns head down and umbilical cord moves, the patient can be discharged home  -If funiform presentation continues the patient will remain in the hospital until term - 37 weeks    Md to follow.  Dr Charlesetta Garibaldi, updated on pts status.   [facility], NP-C 01/31/18 11:45 AM   Pt doing well  Fetal testing reassuring Monitor closely

## 2018-02-01 NOTE — Progress Notes (Signed)
LOS# 23 [name] and 30 wks and 5 days with no cervix and (3.42mm of cervical dilitation) / preterm contractions and funic presentation with infant being breech.   Subjective. Pt in bed resting quietly, in NAD and without complaints. Spirits still standing in good shape. + fetal movement, no vaginal discharge, no vaginal bleeding, denies feeling contractions, denies HA, swelling, CP/SOB/N.   Vitals:   01/31/18 2346 01/31/18 2354 02/01/18 0356 02/01/18 0811  BP: (!) 113/58 (!) 113/58 (!) 123/58 107/60  Pulse:  93 85 100  Resp:    18  Temp:   98.4 F (36.9 C) 98 F (36.7 C)  TempSrc:   Oral   SpO2:   100% 100%  Weight:      Height:      Temp:  [97.3 F (36.3 C)-98.6 F (37 C)] 98 F (36.7 C) (06/25 0811) Pulse Rate:  [75-100] 100 (06/25 0811) Resp:  [16-18] 18 (06/25 0811) BP: (95-123)/(54-60) 107/60 (06/25 0811) SpO2:  [99 %-100 %] 100 % (06/25 0811)  General alert and oriented no distress  Lungs good effort , CTA Bi-lat CV: RRR, no murmurs Abdomen gravid nontender, soft. Ext .Marland Kitchen No edema, no tenderness, neg home, no swelling   No results found for this or any previous visit (from the past 24 hour(s)).  Laura Cross [birth year] [redacted]w[redacted]d  Fetus A Non-Stress Test Interpretation for 02/01/18  Indication: breec, funic presentation, PTL  Fetal Heart Rate A Mode: External Baseline Rate (A): 140 bpm Variability: Minimal, Moderate Accelerations: 10 x 10 Decelerations: Variable, Prolonged Multiple birth?: No  Uterine Activity Mode: Toco Contraction Frequency (min): ui Contraction Duration (sec): 50-90 Contraction Quality: Mild Resting Tone Palpated: Relaxed Resting Time: Adequate    Cxt: Currently , none. Uterus irritable at times.    [name] in 01/21/2018: Impression  SIUP at 29+2 weeks  Footling breech presentation  Normal interval anatomy; anatomic survey complete  Normal amniotic fluid volume  Appropriate interval growth with EFW at the 40th %tile  EV views of  cervix: wide U-shaped funneling down to external  os; difficult to determine amount of cervical dilation;  membranes have prolapsed into cervix but not into vagina; no  significant change since the study of 06/02 ---------------------------------------------------------------------- Recommendations  Follow-up as clinically indicated  Fetal Well being - BPP 5/24= 8/8... fetal heart rate is over all reassuring   GBS negative on 05/10 Beta complete on 05/22 GTT Negative on 06/12 TDAP 01/21/2018  A/P:  LOS # 23; [name] and 30 wks and 5 days with no cervix and (3.3mm of cervical dilitation) / preterm contractions and funic presentation with infant being breech. Uterine fibroids, obesity.  Cat 1 fetal tracing; currently. Pt stable.   1) Continue procardia Q4H for tocolysis  2) VTE prophylactics: pt to continue to wear SCD.  3) continue nightly vaginal progesterone  4) Continue modified bedrest 5) Continue inpatient management for now 6) Showers PRN.  7) Pt allowed to go for a wheelchair ride outside once a day per Dr. Cletis Media     Seen and agreed  8) MFM consult: 06/04  -plan is to keep patient as inpatient with weekly ultrasounds for fetal presentation / check funic presentation  -If baby turns head down and umbilical cord moves, the patient can be discharged home  -If funiform presentation continues the patient will remain in the hospital until term - 37 weeks    Md to follow.   Cecille Rubin Clemmons CNM 02/01/18 11:33 AM

## 2018-02-02 ENCOUNTER — Inpatient Hospital Stay (HOSPITAL_COMMUNITY): Payer: BLUE CROSS/BLUE SHIELD

## 2018-02-02 DIAGNOSIS — Z3A31 31 weeks gestation of pregnancy: Secondary | ICD-10-CM

## 2018-02-02 DIAGNOSIS — O3413 Maternal care for benign tumor of corpus uteri, third trimester: Secondary | ICD-10-CM

## 2018-02-02 DIAGNOSIS — D259 Leiomyoma of uterus, unspecified: Secondary | ICD-10-CM

## 2018-02-02 DIAGNOSIS — R109 Unspecified abdominal pain: Secondary | ICD-10-CM

## 2018-02-02 LAB — CBC WITH DIFFERENTIAL/PLATELET
Basophils Absolute: 0 10*3/uL (ref 0.0–0.1)
Basophils Relative: 0 %
EOS PCT: 1 %
Eosinophils Absolute: 0 10*3/uL (ref 0.0–0.7)
HCT: 31.5 % — ABNORMAL LOW (ref 36.0–46.0)
Hemoglobin: 10.3 g/dL — ABNORMAL LOW (ref 12.0–15.0)
LYMPHS PCT: 26 %
Lymphs Abs: 1.3 10*3/uL (ref 0.7–4.0)
MCH: 27.9 pg (ref 26.0–34.0)
MCHC: 32.7 g/dL (ref 30.0–36.0)
MCV: 85.4 fL (ref 78.0–100.0)
Monocytes Absolute: 0.4 10*3/uL (ref 0.1–1.0)
Monocytes Relative: 7 %
Neutro Abs: 3.3 10*3/uL (ref 1.7–7.7)
Neutrophils Relative %: 66 %
PLATELETS: 200 10*3/uL (ref 150–400)
RBC: 3.69 MIL/uL — ABNORMAL LOW (ref 3.87–5.11)
RDW: 16.3 % — ABNORMAL HIGH (ref 11.5–15.5)
WBC: 5 10*3/uL (ref 4.0–10.5)

## 2018-02-02 LAB — TYPE AND SCREEN
ABO/RH(D): O POS
Antibody Screen: NEGATIVE

## 2018-02-02 MED ORDER — LACTATED RINGERS IV BOLUS
500.0000 mL | Freq: Once | INTRAVENOUS | Status: AC
  Administered 2018-02-02: 491.8 mL via INTRAVENOUS

## 2018-02-02 MED ORDER — FENTANYL CITRATE (PF) 100 MCG/2ML IJ SOLN
50.0000 ug | Freq: Once | INTRAMUSCULAR | Status: AC
  Administered 2018-02-02: 50 ug via INTRAVENOUS
  Filled 2018-02-02: qty 2

## 2018-02-02 NOTE — Progress Notes (Signed)
S: RN called to report patient had a run of frequent contractions for 10 minutes. Patient reported feeling some of the contractions mildly. Contractions have now stopped.   O: Fetal heart rate showed two prolonged decels with good return to baseline and moderate variability. Patient not contracting on monitor.   A: Uterine irritability  P: Alerted Dr. Nelda Marseille, will continue to monitor. Will reassess patient if continued contractions occur or FHR no longer reassuring.   [name] 9:11 PM. 02/02/18

## 2018-02-02 NOTE — Progress Notes (Signed)
Ranee Gosselin, CNM notified of increased contractions since IV bolus initiated and Procardia 10mg  po given.

## 2018-02-02 NOTE — Progress Notes (Addendum)
[name] and 31 weeks with no cervix and (3.62mm of cervical dilitation) / preterm contractions and funic presentation with infant being breech.   Subjective:  RN called me to the room reporting patient was sitting on the side of the bed crying out in pain. On assessment patient stated she went to get up and had sharp, severe epigastric pain at the top of her fundus. She reports it is midline and does not radiate. It is tender to palpation. Vital signs are stable and not indicative of hemorrhage. After cervical check patient stated the pain had decreased from 8/10 to 6/10. Reassuring fetal heart tracing makes uterine rupture or placental abruption unlikely. Uterus is gravid and does not feel firm. Fetal parts are palpable. No loss of uterine tone or change of uterine shape. Patient has multiple large fibroids, suspect possible fibroid torsion. No vaginal bleeding. Patient is not contracting.   Objective:  Vitals:   02/01/18 1629 02/01/18 1945 02/01/18 2332 02/02/18 0325  BP: (!) 110/57 105/62 116/63 (!) 123/54  Pulse: 95 83 88 93  Resp: 16 17 16 18   Temp: 97.9 F (36.6 C) 98.2 F (36.8 C) 97.9 F (36.6 C) 97.7 F (36.5 C)  TempSrc:  Oral Oral Oral  SpO2: 100% 100% 100% 100%  Weight:      Height:       FHR: 140s, moderate variability, +accels, -decels   Vaginal exam: Fingertip/100% effaced/-3/breech  [name] in 01/28/2018: Impression  SIUP at 30+2 weeks  Footling breech presentation  Normal amniotic fluid volume  EV views of cervix: wide U-shaped funneling to external os;  external os may be slightly dilated; feet presenting with cord a  little higher; no significant change from the previous study on  06/14  Multiple uterine fibroids ---------------------------------------------------------------------- Recommendations  Follow-up ultrasound for growth in 3 weeks GBS negative on 05/10 Beta complete on 05/22 GTT Negative on 06/12 TDAP 01/21/2018  Assessment:  32 y.o. G1P0 at 31 weeks  with no cervix and (3.22mm of cervical dilitation) Funic presentation with infant being breech Uterine fibroids Obesity Category 1 FHTs  Plan: -Continue procardia Q4H for tocolysis  -VTE prophylactics: pt to continue to wear SCD.  -Continue nightly vaginal progesterone  -Continue modified bedrest -Repeat U/S with BPP on 01/28/18  -Per MFM consult: 06/04  -plan is to keep patient as inpatient with weekly ultrasounds for fetal presentation / check funic               presentation  -If baby turns head down and umbilical cord moves, the patient can be discharged home  -If funiform presentation continues the patient will remain in the hospital until term - 37 weeks  -Ultrasound to rule out abruption, uterine rupture, or fibroid torsion  -Fentanyl 55mcg once for pain    MD to follow  [name] 3:34 AM 02/02/18  Radiologist called to tell me that on ultrasound they noted fluid in the cul de sac and specified it was internal in the mid abdomen. Placental abruption was ruled out. The area of the patient's pain was consistent with a fundal fibroid. Consulted Dr. Charlesetta Garibaldi, will order stat CBC to rule out internal hemorrhage. Patient's vitals stable still and FHTs are category 1. On exam there is nothing which would indicate uterine rupture.  [name] 5:06 AM 02/02/18  Rounded on patient this evening, she states she has not had any other episodes of increased pain. Did have a few contractions earlier this afternoon but patient denied that she could  feel them. They have since spaced out. LR 500mg  IV bolus ordered to help uterine irritability seen on the monitor earlier. Patient is stable, FHTs are category 1, vitals are stable.   [name] 02/02/18 8:31 PM

## 2018-02-02 NOTE — Progress Notes (Signed)
Dr Alwyn Pea notified of uterine activity. No new orders at this time, CNM to assess later

## 2018-02-03 NOTE — Progress Notes (Signed)
Removed from monitor for a shower.

## 2018-02-03 NOTE — Progress Notes (Addendum)
Hospital day # 25 pregnancy at [redacted]w[redacted]d  S: well, reports good fetal activity      Contractions:none, few; non painful      Vaginal bleeding:none        Vaginal discharge: no significant change      Pt seems to be in a depressed mood. She said it "was one of her bad days" She is tired of being in the hospital and did tear up some. She doesn't have a lot of visitors and her mother will be leaving the weekend to return to [facility] and she will not see her again until the baby is delivered. Her S.O. Works out of town a lot. Offered Zoloft to help with anxiety and depression and she said she would think about it. She had a run of contractions last night and so I canceled her wheelchair ride for today. She was agreeable. O: BP (!) 95/53 (BP Location: Left Arm)   Pulse 88   Temp 98.6 F (37 C) (Oral)   Resp 18   Ht 5\' 1"  (1.549 m)   Wt 193 lb (87.5 kg)   LMP 06/30/2017   SpO2 99%   BMI 36.47 kg/m       Fetal tracings:reviewed and reassuring      Uterus consistent with 31 weeks and non-tender      Extremities: extremities normal, atraumatic, no cyanosis or edema and no significant edema and no signs of DVT  A: [redacted]w[redacted]d with short cervix     unchanged  P: continue current plan of care  Cecille Rubin A Clemmons  CNM 02/03/2018 11:26 AM I agree with above per CNM Clemmons. NST reviewed, cat 1 at 1400, no contractions, some irritability. Dr. Alesia Richards. 02/03/18.  1408.

## 2018-02-04 NOTE — Progress Notes (Addendum)
Laura Cross and 31 wks and 2 days with no cervix and (3.61mm of cervical dilitation) / preterm contractions and funic presentation with infant being breech. Fibroids.   Subjective. Pt in bed resting awake eating breakfast, alone in room. Pt endorses that she was feeling down yesterday but is feeling much better and declines the need for any antidepressant meds. Pt in NAD, denies any question and spirits are high. RN reports no distress or concerns.  Pt denies swelling, HA, vision changes or abdominal pain, no SOB or CP. She denies feeling leakage of fluid or vaginal bleeding. +FM. Pt remains on continuous monitoring. RN reports pt had several prolonged delcels during the night but return to baseline. Decels either  spontaneously resolved or position change resolved them. Pt SCD off now, pt was out of bed to bathroom and tolerated well, RN will replace them. Pt now on modified bedrest.   Vitals:   02/03/18 1556 02/03/18 1917 02/03/18 2323 02/04/18 1133  BP: 111/64 103/60 108/63 113/70  Pulse: 95 92 86 89  Resp: 16 18 17 18   Temp: 98.3 F (36.8 C) 98.2 F (36.8 C) 98 F (36.7 C) 98.2 F (36.8 C)  TempSrc: Oral Oral Oral Oral  SpO2: 100% 96% 99% 99%  Weight:      Height:      Temp:  [98 F (36.7 C)-98.3 F (36.8 C)] 98.2 F (36.8 C) (06/28 1133) Pulse Rate:  [86-95] 89 (06/28 1133) Resp:  [16-18] 18 (06/28 1133) BP: (103-113)/(60-70) 113/70 (06/28 1133) SpO2:  [96 %-100 %] 99 % (06/28 1133)  General alert and oriented no distress  Lungs good effort , CTA Bi-lat CV: RRR, no murmurs Abdomen gravid nontender, soft. Ext .Marland Kitchen No edema, no tenderness, neg home, no swelling    No results found for this or any previous visit (from the past 24 hour(s)).  AUDRIS SPEAKER January 31, 1986 [redacted]w[redacted]d  Fetus A Non-Stress Test Interpretation for 02/04/18  Indication: breec, funic presentation, PTL  Fetal Heart Rate A Mode: External Baseline Rate (A): 135 bpm Variability: Moderate Accelerations: 15 x  15 Decelerations: Variable Multiple birth?: No  Uterine Activity Mode: Toco Contraction Frequency (min): none seen Contraction Duration (sec): 50-60 Contraction Quality: Mild Resting Tone Palpated: Relaxed Resting Time: Adequate  Most Recent FHR    Most Recent Value  Fetal Heart Rate A  Mode External filed at 02/04/2018 1003  Baseline Rate (A) 135 bpm filed at 02/04/2018 1003  Variability 6-25 BPM filed at 02/04/2018 1003  Accelerations 15 x 15 filed at 02/04/2018 1003  Decelerations Variable filed at 02/04/2018 1003  Multiple birth? N filed at 01/12/2018 1442  Fetal Heart Rate Fetus B  Fetal Heart Rate Fetus C    Most Recent SVE    Most Recent Value  Cervical Exam  Dilation 0.5 filed at 02/02/2018 0326  Effacement (%) Thick filed at 02/02/2018 0326  Vag. Bleeding None filed at 02/04/2018 0800  Station 0 filed at 01/09/2018 1027  Exam by: Ranee Gosselin filed at 02/02/2018 0326  Vaginal Bleeding  Vag. Bleeding None filed at 02/04/2018 0800  Membranes  Membrane Status Intact filed at 02/02/2018 0326  Color Other  [mucus] filed at 02/01/2018 0820  Odor Normal filed at 02/01/2018 0820  Amount None filed at 02/03/2018 1952    Cxt: Currently , none. Uterus irritable at times.   Previous US: 01/03/2018, placenta posterior, funic presentation, breech, cervix 1.4cm in length, funneling of internal os noted.   Previous US: 01/09/2018, cervix appears funneling to the OS,  which is open to 3.82mm with debris noted. Breech, funic presentation.  [name] in 01/21/2018: Impression  SIUP at 29+2 weeks  Footling breech presentation  Normal interval anatomy; anatomic survey complete  Normal amniotic fluid volume  Appropriate interval growth with EFW at the 40th %tile  EV views of cervix: wide U-shaped funneling down to external  os; difficult to determine amount of cervical dilation;  membranes have prolapsed into cervix but not into vagina; no  significant change since the study of  06/02  [name] 02/02/2018: Impression  SIUP at 58 0/7 weeks  active fetus  Footling breech presentation  Normal amniotic fluid volume  no previa  no sonographic evidence of abruption  Multiple uterine fibroids  while this is a remote read of images only, the sonographer  comments that the patient's pain is directly under a large  palpable fibroid that is anterior and the upper portion of the  uterus  there is incidental note of images demonstrating  intraabdominal fluid bilaterally in the mid-abdomen though  this "limited ultrasound" in pregnancy is NOT intended to  evaluate findings outside of the uterus as it is a limited  ultrasound of the pregnant uterus; significance of the fluid  should be correlated with physical examination  while there is no sonographic evidence of a defect in the  uterine wall, clinical/physical examination should be utilized in  making this determination (ie, this is a remote read of images  only) ---------------------------------------------------------------------- Recommendations  Follow-up as clinically indicated  Fetal Well being - BPP 5/24= 8/8... fetal heart rate is over all reassuring   GBS negative on 05/10 Beta complete on 05/22 GTT Negative on 06/12 TDAP 01/21/2018  A/P:  LOS # 26; Laura Cross and 31 wks and 2 days with no cervix and (3.16mm of cervical dilitation) / preterm contractions and funic presentation with infant being breech. Uterine fibroids, obesity.  Cat 1 fetal tracing; currently. Pt stable.   1) Continue procardia Q4H for tocolysis  2) VTE prophylactics: pt to continue to wear SCD.  3) continue nightly vaginal progesterone  4) Continue modified bedrest 5) Continue inpatient management for now  6) Growth scan in 2 weeks. Marland Kitchen   7) MFM consult: 06/04  -plan is to keep patient as inpatient with weekly ultrasounds for fetal presentation / check funic presentation  -If baby turns head down and umbilical cord moves, the patient can be  discharged home  -If funiform presentation continues the patient will remain in the hospital until term - 37 weeks    Md to follow. Dr Margie Billet to be updated on pt status.   [facility], NP-C

## 2018-02-05 LAB — TYPE AND SCREEN
ABO/RH(D): O POS
Antibody Screen: NEGATIVE

## 2018-02-05 NOTE — Progress Notes (Signed)
Pt going off unit in wheelchair.

## 2018-02-05 NOTE — Progress Notes (Signed)
Pt may continue her off unit privileges and be brought outside in a wheelchair daily per Dr Alwyn Pea.

## 2018-02-05 NOTE — Progress Notes (Signed)
Name: Laura Cross Medical Record Number:  [id number] Date of Birth: [birth year] Date of Service: 02/05/2018  32 y.o. [name] [redacted]w[redacted]d HD#27 admitted for 27 week preterm contractions with footling breech presentation,  Minimal cervix left and funic presentation.   Pt currently stable with no complaints this morning.  She denies contractions, no vaginal bleeding, no leaking of fluid. Reports good FM.  The patient's past medical history and prenatal records were reviewed.  Additional issues addressed and updated today:  Patient Active Problem List   Diagnosis Date Noted  . Abdominal pain   . Uterine fibroids affecting pregnancy in third trimester   . [redacted] weeks gestation of pregnancy   . Non-stress test with decelerations   . [redacted] weeks gestation of pregnancy   . Preterm labor 12/27/2017  . [redacted] weeks gestation of pregnancy   . Presentation of cord   . Cervical shortening in second trimester 12/17/2017  . Influenza vaccination declined 05/31/2017  . Class 2 severe obesity with serious comorbidity and body mass index (BMI) of 36.0 to 36.9 in adult [facility]) 06/05/2015  . Genital herpes 06/05/2015  . History of syphilis 06/05/2015  . Routine general medical examination at a health care facility 06/05/2015  . Family history of premature CAD 06/05/2015  . Mixed dyslipidemia 06/05/2015  . Impaired fasting blood sugar 06/05/2015  . HPV (human papilloma virus) infection   . Herpes   . Ovarian cyst    Vitals:   02/05/18 0422 02/05/18 0830  BP: (!) 89/51 101/61  Pulse: 78 77  Resp: 20 18  Temp: 98 F (36.7 C) 97.9 F (36.6 C)  SpO2: 98% 100%     Physical Examination:   Vitals:   02/05/18 0422 02/05/18 0830  BP: (!) 89/51 101/61  Pulse: 78 77  Resp: 20 18  Temp: 98 F (36.7 C) 97.9 F (36.6 C)  SpO2: 98% 100%   General appearance - alert, well appearing, and in no distress and oriented to person, place, and time Mental status - alert, oriented to person, place, and time, normal  mood, behavior, speech, dress, motor activity, and thought processes Abd  Soft, gravid, nontender Ex SCDs FHTs  150s, moderate variability accels no decels Toco  No contractions  Cervix: not evaluated  Results for orders placed or performed during the hospital encounter of 01/09/18 (from the past 24 hour(s))  Type and screen [place]     Status: None   Collection Time: 02/05/18  6:02 AM  Result Value Ref Range   ABO/RH(D) O POS    Antibody Screen NEG    Sample Expiration      02/08/2018 Performed at [facility], [address], [place], [place] [address]     Assessment:  HD#27  [redacted]w[redacted]d with history of preterm contractions and advanced dilation / funic presentation. Steroid complete. GBS negative.   Plan:  [name] weekly to check presentation of fetus and location of umbilical cord Patient stable on antepartum floor Continue current antepartum care.  Plan of care per MFM is continue inpatient management until term at 37 weeks primary  Cesarean delivery if signs of labor or fetal distress.   Mishelle Hassan STACIA

## 2018-02-06 NOTE — Progress Notes (Signed)
Laura Cross and 31 wks and 4 days with no cervix and (3.82mm of cervical dilitation) / preterm contractions and funic presentation with infant being breech. Fibroids.   Subjective. Pt in bed resting awake, alone in room. Pt endorses feeling ok today and is looking forward to having her time outside today. Pt in NAD, denies any question and spirits are high. RN reports no distress or concerns.  Pt denies swelling, HA, vision changes or abdominal pain, no SOB or CP. She denies feeling leakage of fluid or vaginal bleeding. +FM. Pt remains on continuous monitoring. RN reports pt had one long prolonged decel this morning  but return to baseline after 8 mins when pt was turned to her left side.Marland Kitchen Pt SCD off now, pt was out of bed to bathroom and tolerated well. Pt now on modified bedrest with outdoor privileges in wheelchair.   Vitals:   02/05/18 2332 02/06/18 0438 02/06/18 0758 02/06/18 1212  BP: (!) 90/54 (!) 97/55 104/64 (!) 96/57  Pulse: 95 87 85 89  Resp: 18 16 18 18   Temp: 98 F (36.7 C) 98 F (36.7 C) 98 F (36.7 C) 97.9 F (36.6 C)  TempSrc: Oral Oral Oral Oral  SpO2: 97% 100% 99% 99%  Weight:      Height:      Temp:  [97.8 F (36.6 C)-98.4 F (36.9 C)] 97.9 F (36.6 C) (06/30 1212) Pulse Rate:  [85-95] 89 (06/30 1212) Resp:  [16-18] 18 (06/30 1212) BP: (90-106)/(54-67) 96/57 (06/30 1212) SpO2:  [97 %-100 %] 99 % (06/30 1212)  General alert and oriented no distress  Lungs good effort , CTA Bi-lat CV: RRR, no murmurs Abdomen gravid nontender, soft. Ext .Marland Kitchen No edema, no tenderness, neg home, no swelling    No results found for this or any previous visit (from the past 24 hour(s)).  JOLIENE SALVADOR 1986-07-27 [redacted]w[redacted]d  Fetus A Non-Stress Test Interpretation for 02/06/18  Indication: breec, funic presentation, PTL  Fetal Heart Rate A Mode: External Baseline Rate (A): 140 bpm Variability: Moderate Accelerations: 15 x 15 Decelerations: Variable, Prolonged Multiple birth?:  No  Uterine Activity Mode: Toco Contraction Frequency (min): UI Contraction Duration (sec): 60 Contraction Quality: Mild Resting Tone Palpated: Relaxed Resting Time: Adequate  Most Recent FHR    Most Recent Value  Fetal Heart Rate A  Mode External filed at 02/06/2018 1300  Baseline Rate (A) 140 bpm filed at 02/06/2018 1300  Variability 6-25 BPM filed at 02/06/2018 1300  Accelerations 15 x 15 filed at 02/06/2018 1300  Decelerations Variable, Prolonged filed at 02/06/2018 1300  Multiple birth? N filed at 02/05/2018 0400  Fetal Heart Rate Fetus B  Fetal Heart Rate Fetus C    Most Recent SVE    Most Recent Value  Cervical Exam  Dilation 0.5 filed at 02/02/2018 0326  Effacement (%) Thick filed at 02/02/2018 0326  Vag. Bleeding None filed at 02/06/2018 0800  Station 0 filed at 01/09/2018 1027  Exam by: Ranee Gosselin filed at 02/02/2018 0326  Vaginal Bleeding  Vag. Bleeding None filed at 02/06/2018 0800  Vaginal bleeding comment small spec when pt. cleaned thid PM.Will save tissue in the future if seen again for RN to evaluate filed at 02/05/2018 2200  Membranes  Membrane Status Intact filed at 02/02/2018 0326  Color Clear filed at 02/05/2018 2200  Odor Normal filed at 02/05/2018 2200  Amount Scant filed at 02/05/2018 2200   Cxt: Currently , none. Uterus irritable at times.   Previous US: 01/03/2018, placenta posterior,  funic presentation, breech, cervix 1.4cm in length, funneling of internal os noted.   Previous US: 01/09/2018, cervix appears funneling to the OS, which is open to 3.33mm with debris noted. Breech, funic presentation.  [name] in 01/21/2018: Impression  SIUP at 29+2 weeks  Footling breech presentation  Normal interval anatomy; anatomic survey complete  Normal amniotic fluid volume  Appropriate interval growth with EFW at the 40th %tile  EV views of cervix: wide U-shaped funneling down to external  os; difficult to determine amount of cervical dilation;   membranes have prolapsed into cervix but not into vagina; no  significant change since the study of 06/02  [name] 02/02/2018: Impression  SIUP at 29 0/7 weeks  active fetus  Footling breech presentation  Normal amniotic fluid volume  no previa  no sonographic evidence of abruption  Multiple uterine fibroids  while this is a remote read of images only, the sonographer  comments that the patient's pain is directly under a large  palpable fibroid that is anterior and the upper portion of the  uterus  there is incidental note of images demonstrating  intraabdominal fluid bilaterally in the mid-abdomen though  this "limited ultrasound" in pregnancy is NOT intended to  evaluate findings outside of the uterus as it is a limited  ultrasound of the pregnant uterus; significance of the fluid  should be correlated with physical examination  while there is no sonographic evidence of a defect in the  uterine wall, clinical/physical examination should be utilized in  making this determination (ie, this is a remote read of images  only) ---------------------------------------------------------------------- Recommendations  Follow-up as clinically indicated  Fetal Well being - BPP 5/24= 8/8... fetal heart rate is over all reassuring   GBS negative on 05/10 Beta complete on 05/22 GTT Negative on 06/12 TDAP 01/21/2018  A/P:  LOS # 28; Laura Cross and 31 wks and 4 days with no cervix and (3.53mm of cervical dilitation) / preterm contractions and funic presentation with infant being breech. Uterine fibroids, obesity.  Cat 1 fetal tracing; currently. Pt stable.   1) Continue procardia Q4H for tocolysis  2) VTE prophylactics: pt to continue to wear SCD.  3) continue nightly vaginal progesterone  4) Continue modified bedrest 5) Continue inpatient management for now  6) Growth scan in 1 week and 5 days.  7) Repeat GBS in 2 weeks  8) Outside privilege in [place]  7) MFM consult: 06/04  -plan is to  keep patient as inpatient with weekly ultrasounds for fetal presentation / check funic presentation  -If baby turns head down and umbilical cord moves, the patient can be discharged home  -If funiform presentation continues the patient will remain in the hospital until term - 37 weeks    Md to follow. Dr Mancel Bale to be updated on pt status.   [facility], NP-C 02/06/18 1:39 PM

## 2018-02-07 NOTE — Progress Notes (Addendum)
Strip Reviewed and only one prolonged decel that resolved around 815 am. Irregular contractions.  Betamethasone was originally given 5/10 and 5/11

## 2018-02-07 NOTE — Progress Notes (Signed)
Pt off fetal monitoring to shower. FHR stable 130 with moderate variability. Pt without complaints. Will resume monitor after pt is finished PM care Toya Smothers, RN

## 2018-02-07 NOTE — Progress Notes (Signed)
Pt without complaints.  No leakage of fluid or VB.  Good FM  BP 99/69   Pulse 93   Temp 98.9 F (37.2 C) (Oral)   Resp 16   Ht 5\' 1"  (1.549 m)   Wt 88.1 kg (194 lb 4 oz)   LMP 06/30/2017   SpO2 100%   BMI 36.70 kg/m   FHTS 120-130 cat 1.    Toco irregular, every 10-20 minutes  Pt in NAD CV RRR Lungs CTAB abd  Gravid soft and NT GU no vb EXt no calf tenderness Results for orders placed or performed during the hospital encounter of 01/09/18 (from the past 72 hour(s))  Type and screen [place]     Status: None   Collection Time: 02/05/18  6:02 AM  Result Value Ref Range   ABO/RH(D) O POS    Antibody Screen NEG    Sample Expiration      02/08/2018 Performed at [facility], [address] [place], [place] [address]     Assessment and Plan [redacted]w[redacted]d Preterm contractions and shortened cervix.   Continue care with po hydration and procardia Social work consult.  pts support systems have changed   Patient ID: Laura Cross, female   DOB: 04-27-1986, 32 y.o.   MRN: [id number]

## 2018-02-08 LAB — TYPE AND SCREEN
ABO/RH(D): O POS
ANTIBODY SCREEN: NEGATIVE

## 2018-02-08 NOTE — Progress Notes (Addendum)
[name] and 31 weeks with no cervix and (3.74mm of cervical dilitation) / preterm contractions and funic presentation with infant being breech.   Subjective:  Patient states she is feeling well. Had some contractions yesterday but denies feeling pain with the contractions. Coping well with long-term hospitalization.   Objective:  Vitals:   02/07/18 2016 02/07/18 2349 02/08/18 0354 02/08/18 0836  BP: 108/67 (!) 103/56 (!) 94/58 109/64  Pulse: 80 76 77 90  Resp: 17 16 17 20   Temp: 98.7 F (37.1 C) 97.8 F (36.6 C) 97.7 F (36.5 C) 98.5 F (36.9 C)  TempSrc:    Oral  SpO2: 99% 99% 99% 100%  Weight:      Height:       Most Recent FHR    Most Recent Value  Fetal Heart Rate A  Mode External filed at 02/08/2018 0600  Baseline Rate (A) 135 bpm filed at 02/08/2018 0600  Variability 6-25 BPM filed at 02/08/2018 0600  Accelerations 15 x 15 filed at 02/08/2018 0600  Decelerations Variable filed at 02/08/2018 0600  Multiple birth? N filed at 02/05/2018 0400  Fetal Heart Rate Fetus B  Fetal Heart Rate Fetus C    Most Recent SVE    Most Recent Value  Cervical Exam  Dilation 0.5 filed at 02/02/2018 0326  Effacement (%) Thick filed at 02/02/2018 0326  Vag. Bleeding None filed at 02/07/2018 0730  Station 0 filed at 01/09/2018 1027  Exam by: Ranee Gosselin filed at 02/02/2018 0326  Vaginal Bleeding  Vag. Bleeding None filed at 02/07/2018 0730  Vaginal bleeding comment small spec when pt. cleaned thid PM.Will save tissue in the future if seen again for RN to evaluate filed at 02/05/2018 2200  Membranes  Membrane Status Intact filed at 02/02/2018 0326  Color Clear filed at 02/05/2018 2200  Odor Normal filed at 02/05/2018 2200  Amount None filed at 02/07/2018 2037    Ultrasound 6/26-Impression  SIUP at 65 0/7 weeks  active fetus  Footling breech presentation  Normal amniotic fluid volume  no previa  no sonographic evidence of abruption  Multiple uterine fibroids  while this is a  remote read of images only, the sonographer  comments that the patient's pain is directly under a large  palpable fibroid that is anterior and the upper portion of the  uterus  there is incidental note of images demonstrating  intraabdominal fluid bilaterally in the mid-abdomen though  this "limited ultrasound" in pregnancy is NOT intended to  evaluate findings outside of the uterus as it is a limited  ultrasound of the pregnant uterus; significance of the fluid  should be correlated with physical examination  while there is no sonographic evidence of a defect in the  uterine wall, clinical/physical examination should be utilized in  making this determination (ie, this is a remote read of images  only)  Assessment:  32 y.o. G1P0 at 31 weeks with no cervix and (3.58mm of cervical dilitation) Funic presentation with infant being breech Uterine fibroids Obesity Category 1 FHTs  Plan: -Continue procardia Q4H for tocolysis  -VTE prophylactics: pt to continue to wear SCD.  -Continue nightly vaginal progesterone  -Continue modified bedrest -Repeat U/S 02/11/18  -Per MFM consult: 06/04  -plan is to keep patient as inpatient with weekly ultrasounds for fetal presentation / check funic               presentation  -If baby turns head down and umbilical cord moves, the patient can be discharged home  -  If funiform presentation continues the patient will remain in the hospital until term - 37 weeks    MD to follow  [name] 8:58 AM 02/08/18  I saw and examined patient at bedside and agree with above findings, assessment and plan per CNM Ranee Gosselin. NST reviewed at 11.50 am, category 1, no contractions.  Dr. Alesia Richards.

## 2018-02-09 NOTE — Progress Notes (Signed)
CSW met with MOB in room 306 to complete an assessment for a change in MOB's social supports.  When CSW arrived, MOB was eating lunch and appeared to be relaxed. MOB was polite, easy to engage, and receptive to meeting with CSW.  CSW assessed for psychosocial stressors and MOB denied all stressors.  MOB shared that MOB has a great support team that consist of MOB's and FOB's family. MOB also communicated that MOB's mother left on yesterday to go home [facility], [place]) however will continue to be a support from a distance.  MOB also shared the FOB Erin Hearing) visits daily and often calls MOB 3-4 times per day.   MOB reported being comfortable and feeling prepared to be a new mother. MOB shared feelings of nervous regarding possible NICU admission and the unknown of infant's medical condition. CSW validated and normalized MOB's thoughts and feelings.  CSW processed therapeutic interventions to assist client decreasing her feelings of anxiousness (deep breathing/relaxation and journal writing); MOB was receptive.     CSW provided MOB with CSW's contact information and encouraged MOB to contact CSW if a need arises.    Laurey Arrow, MSW, LCSW Clinical Social Work [phone]

## 2018-02-09 NOTE — Progress Notes (Signed)
Name: Laura Cross Medical Record Number:  [id number] Date of Birth: 05-Apr-1986 Date of Service: 02/09/2018  32 y.o. [name] [redacted]w[redacted]d HD#31 admitted at 27 weeks for preterm contractions, no cervix, footling breech presentation of fetus with funic presentation.   Pt currently stable with no complaints. She denies contractions, no vaginal bleeding, no leaking of fluid. Reports good FM.  The patient's past medical history and prenatal records were reviewed.  Additional issues addressed and updated today: Patient Active Problem List   Diagnosis Date Noted  . Abdominal pain   . Uterine fibroids affecting pregnancy in third trimester   . [redacted] weeks gestation of pregnancy   . Non-stress test with decelerations   . [redacted] weeks gestation of pregnancy   . Preterm labor 12/27/2017  . [redacted] weeks gestation of pregnancy   . Presentation of cord   . Cervical shortening in second trimester 12/17/2017  . Influenza vaccination declined 05/31/2017  . Class 2 severe obesity with serious comorbidity and body mass index (BMI) of 36.0 to 36.9 in adult [facility]) 06/05/2015  . Genital herpes 06/05/2015  . History of syphilis 06/05/2015  . Routine general medical examination at a health care facility 06/05/2015  . Family history of premature CAD 06/05/2015  . Mixed dyslipidemia 06/05/2015  . Impaired fasting blood sugar 06/05/2015  . HPV (human papilloma virus) infection   . Herpes   . Ovarian cyst     Physical Examination:   Vitals:   02/09/18 0417 02/09/18 0757  BP: (!) 105/55 (!) 91/48  Pulse: 95 81  Resp: 16 18  Temp: 97.9 F (36.6 C) 97.8 F (36.6 C)  SpO2: 100% 99%   General appearance - alert, well appearing, and in no distress and oriented to person, place, and time Mental status - alert, oriented to person, place, and time, normal mood, behavior, speech, dress, motor activity, and thought processes Abd  Soft, gravid, nontender Ex SCDs not on.  No C/C/E FHTs  150s, moderate variability accels, one  decel at 0500 Nadir 120's lasting for 2 minutes with spontaneous return to baseline of 150.  Currently Cat I Toco  No ctx  Cervix: not evaluated  Last ultrasound performed by MFM on 01/28/18 - footling breech / funic presentation  Assessment: HD#31  [redacted]w[redacted]d with no cervix, funic presentation and footling breech. Steroid complete. No signs of PTL  Plan: 1. Continue antepartum care  2. Delivery via c-section if labor or fetal distress 3. Delivery at 37 weeks - will request August 1st for primary c-section now 4. Per MFM patient to obtain presentation [name] weekly.    Demario Faniel STACIA

## 2018-02-10 LAB — GROUP B STREP BY PCR: Group B strep by PCR: NEGATIVE

## 2018-02-10 NOTE — Progress Notes (Addendum)
[name] at [redacted]w[redacted]d with no cervix and (3.30mm of cervical dilitation) / preterm contractions and funic presentation with infant being breech.   Subjective:  Patient states she is feeling well. Had some contractions last night but denies feeling the contractions. Coping well with long-term hospitalization.   Objective:  Vitals:   02/09/18 1541 02/09/18 1947 02/10/18 0020 02/10/18 0405  BP: 113/70 (!) 109/50 (!) 96/56 (!) 101/58  Pulse: 88 89 85 83  Resp: 18 19 16 19   Temp:  98.1 F (36.7 C) 97.6 F (36.4 C)   TempSrc:  Oral    SpO2: 100% 99% 98%   Weight:      Height:       Most Recent FHR    Most Recent Value  Fetal Heart Rate A  Mode External filed at 02/10/2018 0859  Baseline Rate (A) 135 bpm filed at 02/10/2018 0859  Variability <5 BPM, 6-25 BPM filed at 02/10/2018 0859  Accelerations 15 x 15 filed at 02/10/2018 0859  Decelerations Variable filed at 02/10/2018 1751  Multiple birth? N filed at 02/05/2018 0400  Fetal Heart Rate Fetus B  Fetal Heart Rate Fetus C   Most Recent SVE    Most Recent Value  Cervical Exam  Dilation 0.5 filed at 02/02/2018 0326  Effacement (%) Thick filed at 02/02/2018 0326  Vag. Bleeding None filed at 02/07/2018 0730  Station 0 filed at 01/09/2018 1027  Exam by: Ranee Gosselin filed at 02/02/2018 0326  Vaginal Bleeding  Vag. Bleeding None filed at 02/07/2018 0730  Vaginal bleeding comment small spec when pt. cleaned thid PM.Will save tissue in the future if seen again for RN to evaluate filed at 02/05/2018 2200  Membranes  Membrane Status Intact filed at 02/02/2018 0326  Color Clear filed at 02/05/2018 2200  Odor Normal filed at 02/05/2018 2200  Amount None filed at 02/07/2018 2037    Ultrasound 6/26-Impression  SIUP at 29 0/7 weeks  active fetus  Footling breech presentation  Normal amniotic fluid volume  no previa  no sonographic evidence of abruption  Multiple uterine fibroids  while this is a remote read of images only, the  sonographer  comments that the patient's pain is directly under a large  palpable fibroid that is anterior and the upper portion of the  uterus  there is incidental note of images demonstrating  intraabdominal fluid bilaterally in the mid-abdomen though  this "limited ultrasound" in pregnancy is NOT intended to  evaluate findings outside of the uterus as it is a limited  ultrasound of the pregnant uterus; significance of the fluid  should be correlated with physical examination  while there is no sonographic evidence of a defect in the  uterine wall, clinical/physical examination should be utilized in  making this determination (ie, this is a remote read of images  only)  Assessment:  32 y.o. G1P0 at [redacted]w[redacted]d with no cervix and (3.72mm of cervical dilitation) Funic presentation with infant being breech Uterine fibroids Obesity Category 1 FHTs  Plan: -Continue procardia Q4H for tocolysis  -VTE prophylactics: pt to continue to wear SCD.  -Continue nightly vaginal progesterone  -Continue modified bedrest -Repeat U/S 02/11/18  -Per MFM consult: 06/04  -plan is to keep patient as inpatient with weekly ultrasounds for fetal presentation / check funic               presentation  -If baby turns head down and umbilical cord moves, the patient can be discharged home  -If funiform presentation continues the patient will  remain in the hospital until term - 37 weeks    MD to follow  [name] 02/10/18 10:04 AM  GBS repeated today and negative.

## 2018-02-11 NOTE — Progress Notes (Addendum)
[name] at [redacted]w[redacted]d with no cervix and (3.85mm of cervical dilitation) / preterm contractions and funic presentation with infant being breech.   Subjective:  Patient states she is feeling well.Coping well with long-term hospitalization.   Objective:  Vitals:   02/10/18 1556 02/10/18 1943 02/10/18 2341 02/11/18 0340  BP: 117/73 (!) 103/57 (!) 103/49 (!) 97/52  Pulse: 98 90 89 92  Resp: 16 16 15 16   Temp: 97.8 F (36.6 C) 98.4 F (36.9 C) 97.7 F (36.5 C) 98.2 F (36.8 C)  TempSrc: Oral Oral Oral Oral  SpO2: 100% 100% 99%   Weight:      Height:       Most Recent FHR    Most Recent Value  Fetal Heart Rate A  Mode External filed at 02/11/2018 0200  Baseline Rate (A) 125 bpm filed at 02/11/2018 0654  Variability 6-25 BPM filed at 02/11/2018 0654  Accelerations 15 x 15 filed at 02/11/2018 0654  Decelerations Prolonged filed at 02/11/2018 0962  Multiple birth? N filed at 02/05/2018 0400  Fetal Heart Rate Fetus B  Fetal Heart Rate Fetus C   Most Recent SVE    Most Recent Value  Cervical Exam  Dilation 0.5 filed at 02/02/2018 0326  Effacement (%) Thick filed at 02/02/2018 0326  Vag. Bleeding None filed at 02/07/2018 0730  Station 0 filed at 01/09/2018 1027  Exam by: Ranee Gosselin filed at 02/02/2018 0326  Vaginal Bleeding  Vag. Bleeding None filed at 02/07/2018 0730  Vaginal bleeding comment small spec when pt. cleaned thid PM.Will save tissue in the future if seen again for RN to evaluate filed at 02/05/2018 2200  Membranes  Membrane Status Intact filed at 02/02/2018 0326  Color Clear filed at 02/05/2018 2200  Odor Normal filed at 02/05/2018 2200  Amount None filed at 02/07/2018 2037    Ultrasound 02/12/18-Impression  Impression  Live singleton gestation in the 3rd trimester, prehsenting  breech.  Funic presentation observed within dilated cervix.  Assessment:  32 y.o. G1P0 at [redacted]w[redacted]d with no cervix and (3.49mm of cervical dilitation) Funic presentation with infant being  breech Uterine fibroids Obesity Category 1 FHTs  Plan:  -Repeat ultrasound for presentation today  -Continue procardia Q4H for tocolysis  -VTE prophylactics: pt to continue to wear SCD.  -Continue nightly vaginal progesterone  -Continue modified bedrest -Per MFM consult: 06/04  -plan is to keep patient as inpatient with weekly ultrasounds for fetal presentation / check funic               presentation  -If baby turns head down and umbilical cord moves, the patient can be discharged home  -If funiform presentation continues the patient will remain in the hospital until term - 37 weeks    MD to follow  [name] 02/11/18 8:11 AM

## 2018-02-12 ENCOUNTER — Inpatient Hospital Stay (HOSPITAL_COMMUNITY): Payer: BLUE CROSS/BLUE SHIELD

## 2018-02-12 DIAGNOSIS — O99213 Obesity complicating pregnancy, third trimester: Secondary | ICD-10-CM

## 2018-02-12 DIAGNOSIS — O26873 Cervical shortening, third trimester: Secondary | ICD-10-CM

## 2018-02-12 DIAGNOSIS — O3413 Maternal care for benign tumor of corpus uteri, third trimester: Secondary | ICD-10-CM

## 2018-02-12 NOTE — Progress Notes (Signed)
Irregular ctxs noted on monitor. Pt denies feeling any ctxs. Scheduled Procardia given. Pt taken off EFM and taken to Ultrasound. Toya Smothers, RN

## 2018-02-12 NOTE — Progress Notes (Signed)
Irreg contractions. Pt not feeling them. Scheduled Procardia given. FHR stable. Toya Smothers, RN

## 2018-02-12 NOTE — Progress Notes (Addendum)
[name] at 2w3dwith no cervix and (3.675mof cervical dilitation) / preterm contractions and funic presentation with infant being breech.   Subjective:  Patient states she is feeling well. Met her on the way down for ultrasound as it was postponed until today.Coping well with long-term hospitalization.   Objective:  Vitals:   02/11/18 2023 02/12/18 0001 02/12/18 0348 02/12/18 0441  BP: (!) 112/59 (!) 95/54 101/64   Pulse: 82 84 79   Resp: _0 Temp: 98.1 F (36.7 C) 97.9 F (36.6 C) 98.5 F (36.9 C)   TempSrc: Oral Oral Oral   SpO2: 100% 99% 99% 98%  Weight:      Height:       Most Recent FHR    Most Recent Value  Fetal Heart Rate A  Mode External filed at 02/12/2018 0700  Baseline Rate (A) 140 bpm filed at 02/12/2018 0700  Variability 6-25 BPM filed at 02/12/2018 0700  Accelerations 15 x 15 filed at 02/12/2018 0700  Decelerations Prolonged, Variable filed at 02/12/2018 0700  Multiple birth? N filed at 02/11/2018 1900  Fetal Heart Rate Fetus B  Fetal Heart Rate Fetus C    Most Recent SVE    Most Recent Value  Cervical Exam  Dilation 0.5 filed at 02/02/2018 0326  Effacement (%) Thick filed at 02/02/2018 0326  Vag. Bleeding None filed at 02/07/2018 0730  Station 0 filed at 01/09/2018 1027  Exam by: ElRanee Gosseliniled at 02/02/2018 0326  Vaginal Bleeding  Vag. Bleeding None filed at 02/07/2018 0730  Vaginal bleeding comment small spec when pt. cleaned thid PM.Will save tissue in the future if seen again for RN to evaluate filed at 02/05/2018 2200  Membranes  Membrane Status Intact filed at 02/02/2018 0326  Color Clear filed at 02/05/2018 2200  Odor Normal filed at 02/05/2018 2200  Amount None filed at 02/07/2018 2037    Ultrasound 6/26-Impression  SIUP at 3110/7 weeks  active fetus  Footling breech presentation  Normal amniotic fluid volume  no previa  no sonographic evidence of abruption  Multiple uterine fibroids  while this is a remote read of images  only, the sonographer  comments that the patient's pain is directly under a large  palpable fibroid that is anterior and the upper portion of the  uterus  there is incidental note of images demonstrating  intraabdominal fluid bilaterally in the mid-abdomen though  this "limited ultrasound" in pregnancy is NOT intended to  evaluate findings outside of the uterus as it is a limited  ultrasound of the pregnant uterus; significance of the fluid  should be correlated with physical examination  while there is no sonographic evidence of a defect in the  uterine wall, clinical/physical examination should be utilized in  making this determination (ie, this is a remote read of images  only)  Assessment:  [age].o. G1P0 at 3224w3dth no cervix and (3.6mm3m cervical dilitation) Funic presentation with infant being breech Uterine fibroids Obesity Category 1 FHTs  Plan:  -Repeat ultrasound for presentation today  -Continue procardia Q4H for tocolysis  -VTE prophylactics: pt to continue to wear SCD.  -Continue nightly vaginal progesterone  -Continue modified bedrest -Per MFM consult: 06/04  -plan is to keep patient as inpatient with weekly ultrasounds for fetal presentation / check funic               presentation  -If baby turns head down and umbilical cord moves, the patient can be discharged home  -  If funiform presentation continues the patient will remain in the hospital until term - 37 weeks    MD to follow  Laura Cross 02/12/18 8:11 AM   Pt is comfortable no c/o Exam is benign Tracing is reassuring Awaiting [name] Continue current care

## 2018-02-13 NOTE — Progress Notes (Signed)
4 minute decel down to 100 at nadir, resolved with position change to right side.

## 2018-02-13 NOTE — Progress Notes (Signed)
Unwitnessed 4 minute deceleration down to 100 at nadir, resolved spontaneously and now back up to baseline of 140.

## 2018-02-13 NOTE — Progress Notes (Signed)
[name] at [redacted]w[redacted]d with no cervix and (3.88mm of cervical dilitation) / preterm contractions and funic presentation with infant being breech.   Subjective:  Patient asleep and comfortable.   Objective:  Vitals:   02/12/18 1953 02/12/18 2350 02/13/18 0359 02/13/18 0825  BP: (!) 95/56 (!) 89/53 99/63 102/67  Pulse: 84 83 85 88  Resp: 18 18 16 16   Temp: 97.8 F (36.6 C) 98.1 F (36.7 C) 97.6 F (36.4 C) 98.4 F (36.9 C)  TempSrc: Oral Oral  Oral  SpO2: 100% 99% 99% 100%  Weight:      Height:       Most Recent FHR    Most Recent Value  Fetal Heart Rate A  Mode External filed at 02/13/2018 0900  Baseline Rate (A) 140 bpm filed at 02/13/2018 0900  Variability <5 BPM, 6-25 BPM filed at 02/13/2018 0900  Accelerations None filed at 02/13/2018 0900  Decelerations Variable, Prolonged filed at 02/13/2018 0900  Multiple birth? N filed at 02/11/2018 1900  Fetal Heart Rate Fetus B  Fetal Heart Rate Fetus C   Most Recent SVE    Most Recent Value  Cervical Exam  Dilation 0.5 filed at 02/02/2018 0326  Effacement (%) Thick filed at 02/02/2018 0326  Vag. Bleeding None filed at 02/07/2018 0730  Station 0 filed at 01/09/2018 1027  Exam by: Ranee Gosselin filed at 02/02/2018 0326  Vaginal Bleeding  Vag. Bleeding None filed at 02/07/2018 0730  Vaginal bleeding comment small spec when pt. cleaned thid PM.Will save tissue in the future if seen again for RN to evaluate filed at 02/05/2018 2200  Membranes  Membrane Status Intact filed at 02/02/2018 0326  Color Clear filed at 02/05/2018 2200  Odor Normal filed at 02/05/2018 2200  Amount None filed at 02/07/2018 2037    Ultrasound 02/12/18-Impression  Live singleton gestation in the 3rd trimester, prehsenting  breech.  Funic presentation observed within dilated cervix. ---------------------------------------------------------------------- Recommendations  Follow-up scans as clinically indicated.  Assessment:  32 y.o. G1P0 at [redacted]w[redacted]d with no  cervix and (3.89mm of cervical dilitation) Funic presentation with infant being breech Uterine fibroids Obesity Category 1 FHTs  Plan: -Continue procardia Q4H for tocolysis  -VTE prophylactics: pt to continue to wear SCD.  -Continue nightly vaginal progesterone  -Continue modified bedrest -Per MFM consult: 06/04  -plan is to keep patient as inpatient with weekly ultrasounds for fetal presentation / check funic               presentation  -If baby turns head down and umbilical cord moves, the patient can be discharged home  -If funiform presentation continues the patient will remain in the hospital until term - 37 weeks    MD to follow  [name] 02/13/18 10:27 AM

## 2018-02-14 ENCOUNTER — Inpatient Hospital Stay (HOSPITAL_COMMUNITY): Payer: BLUE CROSS/BLUE SHIELD | Admitting: Certified Registered Nurse Anesthetist

## 2018-02-14 ENCOUNTER — Encounter (HOSPITAL_COMMUNITY)
Admission: AD | Disposition: A | Discharge status: Home / Self Care | Payer: Self-pay | Source: Ambulatory Visit | Attending: Obstetrics & Gynecology

## 2018-02-14 LAB — PREPARE RBC (CROSSMATCH)

## 2018-02-14 LAB — AMNISURE RUPTURE OF MEMBRANE (ROM) NOT AT ARMC: AMNISURE: POSITIVE

## 2018-02-14 SURGERY — Surgical Case
Anesthesia: Spinal

## 2018-02-14 MED ORDER — OXYTOCIN 10 UNIT/ML IJ SOLN
INTRAMUSCULAR | Status: DC | PRN
  Administered 2018-02-14: 40 [IU] via INTRAVENOUS

## 2018-02-14 MED ORDER — TETANUS-DIPHTH-ACELL PERTUSSIS 5-2.5-18.5 LF-MCG/0.5 IM SUSP: 0.5000 mL | Freq: Once | INTRAMUSCULAR | Status: DC

## 2018-02-14 MED ORDER — DEXAMETHASONE SODIUM PHOSPHATE 4 MG/ML IJ SOLN
INTRAMUSCULAR | Status: DC | PRN
  Administered 2018-02-14: 4 mg via INTRAVENOUS

## 2018-02-14 MED ORDER — MORPHINE SULFATE (PF) 0.5 MG/ML IJ SOLN
INTRAMUSCULAR | Status: AC
Start: 2018-02-14 — End: ?
  Filled 2018-02-14: qty 10

## 2018-02-14 MED ORDER — DIBUCAINE 1 % RE OINT: 1.0000 "application " | TOPICAL_OINTMENT | RECTAL | Status: DC | PRN

## 2018-02-14 MED ORDER — OXYTOCIN 10 UNIT/ML IJ SOLN
INTRAMUSCULAR | Status: AC
  Filled 2018-02-14: qty 4

## 2018-02-14 MED ORDER — SENNOSIDES-DOCUSATE SODIUM 8.6-50 MG PO TABS
2.0000 | ORAL_TABLET | ORAL | Status: DC
  Administered 2018-02-15 – 2018-02-16 (×3): 2 via ORAL
  Filled 2018-02-14 (×3): qty 2

## 2018-02-14 MED ORDER — ZOLPIDEM TARTRATE 5 MG PO TABS: 5.0000 mg | ORAL_TABLET | Freq: Every evening | ORAL | Status: DC | PRN

## 2018-02-14 MED ORDER — SIMETHICONE 80 MG PO CHEW
80.0000 mg | CHEWABLE_TABLET | ORAL | Status: DC
  Administered 2018-02-15 – 2018-02-16 (×3): 80 mg via ORAL
  Filled 2018-02-14 (×3): qty 1

## 2018-02-14 MED ORDER — SIMETHICONE 80 MG PO CHEW: 80.0000 mg | CHEWABLE_TABLET | ORAL | Status: DC | PRN

## 2018-02-14 MED ORDER — DIPHENHYDRAMINE HCL 25 MG PO CAPS
25.0000 mg | ORAL_CAPSULE | Freq: Four times a day (QID) | ORAL | Status: DC | PRN
  Filled 2018-02-14: qty 1

## 2018-02-14 MED ORDER — NALBUPHINE HCL 10 MG/ML IJ SOLN
5.0000 mg | INTRAMUSCULAR | Status: DC | PRN
  Administered 2018-02-14: 5 mg via INTRAVENOUS
  Filled 2018-02-14: qty 1

## 2018-02-14 MED ORDER — SIMETHICONE 80 MG PO CHEW
80.0000 mg | CHEWABLE_TABLET | Freq: Three times a day (TID) | ORAL | Status: DC
  Administered 2018-02-14 – 2018-02-17 (×6): 80 mg via ORAL
  Filled 2018-02-14 (×6): qty 1

## 2018-02-14 MED ORDER — ONDANSETRON HCL 4 MG/2ML IJ SOLN: 4.0000 mg | Freq: Three times a day (TID) | INTRAMUSCULAR | Status: DC | PRN

## 2018-02-14 MED ORDER — PHENYLEPHRINE 8 MG IN D5W 100 ML (0.08MG/ML) PREMIX OPTIME
INJECTION | INTRAVENOUS | Status: DC | PRN
  Administered 2018-02-14: 60 ug/min via INTRAVENOUS

## 2018-02-14 MED ORDER — CEFAZOLIN SODIUM-DEXTROSE 2-4 GM/100ML-% IV SOLN
2.0000 g | Freq: Once | INTRAVENOUS | Status: DC
  Filled 2018-02-14: qty 100

## 2018-02-14 MED ORDER — FENTANYL CITRATE (PF) 100 MCG/2ML IJ SOLN
INTRAMUSCULAR | Status: DC | PRN
  Administered 2018-02-14: 50 ug via INTRAVENOUS
  Administered 2018-02-14: 10 ug via INTRATHECAL
  Administered 2018-02-14: 40 ug via INTRAVENOUS

## 2018-02-14 MED ORDER — SODIUM CHLORIDE 0.9% IV SOLUTION: Freq: Once | INTRAVENOUS | Status: DC

## 2018-02-14 MED ORDER — FENTANYL CITRATE (PF) 100 MCG/2ML IJ SOLN
25.0000 ug | INTRAMUSCULAR | Status: DC | PRN
  Administered 2018-02-14: 50 ug via INTRAVENOUS
  Administered 2018-02-14: 25 ug via INTRAVENOUS
  Administered 2018-02-14: 50 ug via INTRAVENOUS

## 2018-02-14 MED ORDER — NALOXONE HCL 4 MG/10ML IJ SOLN
1.0000 ug/kg/h | INTRAVENOUS | Status: DC | PRN
  Filled 2018-02-14: qty 5

## 2018-02-14 MED ORDER — DIPHENHYDRAMINE HCL 25 MG PO CAPS
25.0000 mg | ORAL_CAPSULE | ORAL | Status: DC | PRN
  Administered 2018-02-14: 25 mg via ORAL

## 2018-02-14 MED ORDER — OXYCODONE HCL 5 MG/5ML PO SOLN: 5.0000 mg | Freq: Once | ORAL | Status: DC | PRN

## 2018-02-14 MED ORDER — FENTANYL CITRATE (PF) 100 MCG/2ML IJ SOLN
INTRAMUSCULAR | Status: AC
  Filled 2018-02-14: qty 2

## 2018-02-14 MED ORDER — SODIUM CHLORIDE 0.9% FLUSH: 3.0000 mL | INTRAVENOUS | Status: DC | PRN

## 2018-02-14 MED ORDER — KETOROLAC TROMETHAMINE 30 MG/ML IJ SOLN: 30.0000 mg | Freq: Four times a day (QID) | INTRAMUSCULAR | Status: AC | PRN

## 2018-02-14 MED ORDER — MENTHOL 3 MG MT LOZG: 1.0000 | LOZENGE | OROMUCOSAL | Status: DC | PRN

## 2018-02-14 MED ORDER — ACETAMINOPHEN 325 MG PO TABS: 650.0000 mg | ORAL_TABLET | ORAL | Status: DC | PRN

## 2018-02-14 MED ORDER — LACTATED RINGERS IV SOLN
INTRAVENOUS | Status: DC | PRN
  Administered 2018-02-14 (×3): via INTRAVENOUS

## 2018-02-14 MED ORDER — SODIUM CHLORIDE 0.9 % IR SOLN
Status: DC | PRN
  Administered 2018-02-14: 1

## 2018-02-14 MED ORDER — PRENATAL MULTIVITAMIN CH
1.0000 | ORAL_TABLET | Freq: Every day | ORAL | Status: DC
  Administered 2018-02-14 – 2018-02-16 (×3): 1 via ORAL
  Filled 2018-02-14 (×3): qty 1

## 2018-02-14 MED ORDER — COCONUT OIL OIL
1.0000 "application " | TOPICAL_OIL | Status: DC | PRN
  Administered 2018-02-14: 1 via TOPICAL
  Filled 2018-02-14: qty 120

## 2018-02-14 MED ORDER — BUPIVACAINE IN DEXTROSE 0.75-8.25 % IT SOLN
INTRATHECAL | Status: DC | PRN
  Administered 2018-02-14: 1.6 mL via INTRATHECAL

## 2018-02-14 MED ORDER — IBUPROFEN 600 MG PO TABS
600.0000 mg | ORAL_TABLET | Freq: Four times a day (QID) | ORAL | Status: DC
  Administered 2018-02-14 – 2018-02-17 (×9): 600 mg via ORAL
  Filled 2018-02-14 (×9): qty 1

## 2018-02-14 MED ORDER — OXYTOCIN 40 UNITS IN LACTATED RINGERS INFUSION - SIMPLE MED: 2.5000 [IU]/h | INTRAVENOUS | Status: DC

## 2018-02-14 MED ORDER — NALBUPHINE HCL 10 MG/ML IJ SOLN: 5.0000 mg | Freq: Once | INTRAMUSCULAR | Status: DC | PRN

## 2018-02-14 MED ORDER — SOD CITRATE-CITRIC ACID 500-334 MG/5ML PO SOLN
30.0000 mL | Freq: Once | ORAL | Status: AC
  Administered 2018-02-14: 30 mL via ORAL

## 2018-02-14 MED ORDER — OXYCODONE HCL 5 MG PO TABS: 5.0000 mg | ORAL_TABLET | Freq: Once | ORAL | Status: DC | PRN

## 2018-02-14 MED ORDER — ONDANSETRON HCL 4 MG/2ML IJ SOLN: 4.0000 mg | Freq: Four times a day (QID) | INTRAMUSCULAR | Status: DC | PRN

## 2018-02-14 MED ORDER — DIPHENHYDRAMINE HCL 50 MG/ML IJ SOLN: 12.5000 mg | INTRAMUSCULAR | Status: DC | PRN

## 2018-02-14 MED ORDER — CEFAZOLIN SODIUM-DEXTROSE 2-3 GM-%(50ML) IV SOLR
INTRAVENOUS | Status: DC | PRN
  Administered 2018-02-14: 2 g via INTRAVENOUS

## 2018-02-14 MED ORDER — SCOPOLAMINE 1 MG/3DAYS TD PT72
MEDICATED_PATCH | TRANSDERMAL | Status: DC | PRN
  Administered 2018-02-14: 1 via TRANSDERMAL

## 2018-02-14 MED ORDER — MORPHINE SULFATE (PF) 0.5 MG/ML IJ SOLN
INTRAMUSCULAR | Status: DC | PRN
  Administered 2018-02-14: .2 mg via INTRATHECAL

## 2018-02-14 MED ORDER — KETOROLAC TROMETHAMINE 30 MG/ML IJ SOLN
30.0000 mg | Freq: Four times a day (QID) | INTRAMUSCULAR | Status: AC | PRN
  Administered 2018-02-14 (×2): 30 mg via INTRAVENOUS
  Filled 2018-02-14 (×2): qty 1

## 2018-02-14 MED ORDER — SCOPOLAMINE 1 MG/3DAYS TD PT72
1.0000 | MEDICATED_PATCH | Freq: Once | TRANSDERMAL | Status: DC
  Filled 2018-02-14: qty 1

## 2018-02-14 MED ORDER — PHENYLEPHRINE 8 MG IN D5W 100 ML (0.08MG/ML) PREMIX OPTIME
INJECTION | INTRAVENOUS | Status: AC
  Filled 2018-02-14: qty 100

## 2018-02-14 MED ORDER — NALBUPHINE HCL 10 MG/ML IJ SOLN: 5.0000 mg | INTRAMUSCULAR | Status: DC | PRN

## 2018-02-14 MED ORDER — OXYCODONE HCL 5 MG PO TABS
10.0000 mg | ORAL_TABLET | ORAL | Status: DC | PRN
  Administered 2018-02-15 – 2018-02-16 (×5): 10 mg via ORAL
  Filled 2018-02-14 (×5): qty 2

## 2018-02-14 MED ORDER — ONDANSETRON HCL 4 MG/2ML IJ SOLN
INTRAMUSCULAR | Status: DC | PRN
  Administered 2018-02-14: 4 mg via INTRAVENOUS

## 2018-02-14 MED ORDER — LACTATED RINGERS IV SOLN
INTRAVENOUS | Status: DC
  Administered 2018-02-14 – 2018-02-15 (×2): via INTRAVENOUS

## 2018-02-14 MED ORDER — NALOXONE HCL 0.4 MG/ML IJ SOLN: 0.4000 mg | INTRAMUSCULAR | Status: DC | PRN

## 2018-02-14 MED ORDER — OXYCODONE HCL 5 MG PO TABS
5.0000 mg | ORAL_TABLET | ORAL | Status: DC | PRN
  Administered 2018-02-16: 5 mg via ORAL
  Filled 2018-02-14 (×2): qty 1

## 2018-02-14 MED ORDER — DEXAMETHASONE SODIUM PHOSPHATE 4 MG/ML IJ SOLN
INTRAMUSCULAR | Status: AC
  Filled 2018-02-14: qty 1

## 2018-02-14 MED ORDER — WITCH HAZEL-GLYCERIN EX PADS: 1.0000 "application " | MEDICATED_PAD | CUTANEOUS | Status: DC | PRN

## 2018-02-14 MED ORDER — MEPERIDINE HCL 25 MG/ML IJ SOLN: 6.2500 mg | INTRAMUSCULAR | Status: DC | PRN

## 2018-02-14 MED ORDER — SCOPOLAMINE 1 MG/3DAYS TD PT72
MEDICATED_PATCH | TRANSDERMAL | Status: AC
  Filled 2018-02-14: qty 1

## 2018-02-14 MED ORDER — ONDANSETRON HCL 4 MG/2ML IJ SOLN
INTRAMUSCULAR | Status: AC
  Filled 2018-02-14: qty 2

## 2018-02-14 SURGICAL SUPPLY — 37 items
APL SKNCLS STERI-STRIP NONHPOA (GAUZE/BANDAGES/DRESSINGS) ×1
BENZOIN TINCTURE PRP APPL 2/3 (GAUZE/BANDAGES/DRESSINGS) ×2 IMPLANT
CHLORAPREP W/TINT 26ML (MISCELLANEOUS) ×2 IMPLANT
CLAMP CORD UMBIL (MISCELLANEOUS) IMPLANT
CLOTH BEACON ORANGE TIMEOUT ST (SAFETY) ×2 IMPLANT
DRSG OPSITE POSTOP 4X10 (GAUZE/BANDAGES/DRESSINGS) ×2 IMPLANT
ELECT REM PT RETURN 9FT ADLT (ELECTROSURGICAL) ×2
ELECTRODE REM PT RTRN 9FT ADLT (ELECTROSURGICAL) ×1 IMPLANT
EXTRACTOR VACUUM BELL STYLE (SUCTIONS) IMPLANT
EXTRACTOR VACUUM KIWI (MISCELLANEOUS) IMPLANT
GLOVE BIO SURGEON STRL SZ 6.5 (GLOVE) ×2 IMPLANT
GLOVE BIOGEL PI IND STRL 7.0 (GLOVE) ×2 IMPLANT
GLOVE BIOGEL PI INDICATOR 7.0 (GLOVE) ×2
GOWN STRL REUS W/TWL LRG LVL3 (GOWN DISPOSABLE) ×6 IMPLANT
HEMOSTAT SURGICEL 2X14 (HEMOSTASIS) ×1 IMPLANT
KIT ABG SYR 3ML LUER SLIP (SYRINGE) IMPLANT
NDL HYPO 25X5/8 SAFETYGLIDE (NEEDLE) IMPLANT
NEEDLE HYPO 22GX1.5 SAFETY (NEEDLE) IMPLANT
NEEDLE HYPO 25X5/8 SAFETYGLIDE (NEEDLE) IMPLANT
NS IRRIG 1000ML POUR BTL (IV SOLUTION) ×2 IMPLANT
PACK C SECTION WH (CUSTOM PROCEDURE TRAY) ×2 IMPLANT
PAD OB MATERNITY 4.3X12.25 (PERSONAL CARE ITEMS) ×2 IMPLANT
PENCIL SMOKE EVAC W/HOLSTER (ELECTROSURGICAL) ×2 IMPLANT
RTRCTR C-SECT PINK 25CM LRG (MISCELLANEOUS) ×2 IMPLANT
STRIP CLOSURE SKIN 1/2X4 (GAUZE/BANDAGES/DRESSINGS) ×2 IMPLANT
SUT CHROMIC 2 0 CT 1 (SUTURE) ×4 IMPLANT
SUT MNCRL 0 VIOLET CTX 36 (SUTURE) ×2 IMPLANT
SUT MONOCRYL 0 CTX 36 (SUTURE) ×2
SUT PDS AB 0 CTX 36 PDP370T (SUTURE) IMPLANT
SUT PLAIN 2 0 (SUTURE)
SUT PLAIN ABS 2-0 CT1 27XMFL (SUTURE) IMPLANT
SUT VIC AB 0 CTX 36 (SUTURE) ×4
SUT VIC AB 0 CTX36XBRD ANBCTRL (SUTURE) ×2 IMPLANT
SUT VIC AB 4-0 KS 27 (SUTURE) ×2 IMPLANT
SYR CONTROL 10ML LL (SYRINGE) IMPLANT
TOWEL OR 17X24 6PK STRL BLUE (TOWEL DISPOSABLE) ×2 IMPLANT
TRAY FOLEY W/BAG SLVR 14FR LF (SET/KITS/TRAYS/PACK) ×2 IMPLANT

## 2018-02-14 NOTE — Anesthesia Postprocedure Evaluation (Signed)
Anesthesia Post Note  Patient: AJLA MCGEACHY  Procedure(s) Performed: CESAREAN SECTION (N/A )     Patient location during evaluation: Women's Unit Anesthesia Type: Spinal Level of consciousness: oriented and awake and alert Pain management: pain level controlled Vital Signs Assessment: post-procedure vital signs reviewed and stable Respiratory status: spontaneous breathing and respiratory function stable Cardiovascular status: blood pressure returned to baseline and stable Postop Assessment: no headache, no backache and no apparent nausea or vomiting Anesthetic complications: no    Last Vitals:  Vitals:   02/14/18 1610 02/14/18 2018  BP: 106/64 (!) 108/58  Pulse: 91 (!) 57  Resp: 17 16  Temp: 37.3 C (!) 36.2 C  SpO2: 99% 100%    Last Pain:  Vitals:   02/14/18 2018  TempSrc: Oral  PainSc:    Pain Goal: Patients Stated Pain Goal: 3 (02/14/18 1620)               Riki Sheer

## 2018-02-14 NOTE — Addendum Note (Signed)
Addendum  created 02/14/18 2021 by Riki Sheer, CRNA   Sign clinical note

## 2018-02-14 NOTE — Lactation Note (Signed)
This note was copied from a baby's chart. Lactation Consultation Note  Patient Name: Girl Fardowsa Cronk Today's Date: 02/14/2018   LC initiated pumping with mom using DEBP. Mom able toTeach back hand expression and massage.  Enc her to add that to pumping.  Birth time 05:46 on 02/14/18 at 32 weeks and 5 days gestation. Mom reports she has a Medela double electric breast pump for home use provided by insurance.  Mom has notable keloid scars around bottom of areaolas from Bilateral breast reduction done in 2017. Mom reports reduction done in the lollipop technique. Mom reports breast and nipple sensation.  Did not inquire at this time about change in breast during pregnancy. Unable to hand express small drops of colostrum at this time.  Instructed mom on how to take breast pump kit apart for cleaning and mom able to teach back.  Reviewed and gave booklet" Providing Breastmilk for your baby in NICU". Mom is ready to go see her baby.   Feeding Feeding Type: Donor Breast Milk  LATCH Score                   Interventions    Lactation Tools Discussed/Used     Consult Status      Hope S Smith 02/14/2018, 5:06 PM    

## 2018-02-14 NOTE — Progress Notes (Signed)
Result for amnisure came back positive. RN phoned CNM on call, no answer. RN left a voicemail.

## 2018-02-14 NOTE — Progress Notes (Signed)
Follow up visit with [facility] after the delivery of her daughter Annie Sable.  Chavy shared the story of her water breaking very early this morning.  She said the day has felt like a whirlwind, but she is doing okay now after having some assurance that her daughter is doing well.  I offered support and space for her to process her feelings about the early delivery.  She is coping well and is grateful for the support and is eager to see her daughter.  Please page as further needs arise.  Donald Prose. Elyn Peers, M.Div. [facility] Chaplain Pager [phone] Office [phone]

## 2018-02-14 NOTE — Transfer of Care (Signed)
Immediate Anesthesia Transfer of Care Note  Patient: Laura Cross  Procedure(s) Performed: CESAREAN SECTION (N/A )  Patient Location: PACU  Anesthesia Type:Spinal  Level of Consciousness: awake, alert , oriented and patient cooperative  Airway & Oxygen Therapy: Patient Spontanous Breathing  Post-op Assessment: Report given to RN and Post -op Vital signs reviewed and stable  Post vital signs: Reviewed and stable  Last Vitals:  Vitals Value Taken Time  BP    Temp    Pulse 79 02/14/2018  6:47 AM  Resp 17 02/14/2018  6:47 AM  SpO2 100 % 02/14/2018  6:47 AM  Vitals shown include unvalidated device data.  Last Pain:  Vitals:   02/14/18 0455  TempSrc:   PainSc: 0-No pain      Patients Stated Pain Goal: 3 (33/17/40 9927)  Complications: No apparent anesthesia complications

## 2018-02-14 NOTE — Progress Notes (Signed)
RN phoned midwife on call once more and was able to notify of BP 82/51. No new orders at this time.

## 2018-02-14 NOTE — Progress Notes (Signed)
Hospital day # 36 pregnancy at [redacted]w[redacted]d--with PROM.  S: Pt states PROM       Perception of contractions: none      Vaginal bleeding: none now       Vaginal discharge:  watery and no significant change  O: BP 95/67 (BP Location: Right Arm)   Pulse (!) 101   Temp 98.2 F (36.8 C)   Resp 16   Ht 5\' 1"  (1.549 m)   Wt 88.1 kg (194 lb 4 oz)   LMP 06/30/2017   SpO2 98%   BMI 36.70 kg/m   Results for orders placed or performed during the hospital encounter of 01/09/18 (from the past 24 hour(s))  Amnisure rupture of membrane (rom)not at [facility]     Status: None   Collection Time: 02/14/18  3:30 AM  Result Value Ref Range   Amnisure ROM POSITIVE   Prepare RBC     Status: None   Collection Time: 02/14/18  4:34 AM  Result Value Ref Range   Order Confirmation      ORDER PROCESSED BY BLOOD BANK Performed at [facility], [address], [place], [place] [address]          Fetal tracings:FHT 145 accels occ variable decel      Contractions:   Occ      Uterus gravid and non-tender      Extremities: extremities normal, atraumatic, no cyanosis or edema and no significant edema and no signs of DVT          Labs:  2 units cross match    A: [redacted]w[redacted]d with breech with funic cord with PROM at 0330 clear fluid     Cat 1 strip  P: Proceed with LTCS Starla Link CNM, MSN 02/14/2018 4:35 AM

## 2018-02-14 NOTE — Anesthesia Preprocedure Evaluation (Signed)
Anesthesia Evaluation  Patient identified by MRN, date of birth, ID band Patient awake    Reviewed: Allergy & Precautions, H&P , NPO status , Patient's Chart, lab work & pertinent test results  Airway Mallampati: II   Neck ROM: full    Dental   Pulmonary neg pulmonary ROS,    breath sounds clear to auscultation       Cardiovascular negative cardio ROS   Rhythm:regular Rate:Normal     Neuro/Psych    GI/Hepatic   Endo/Other    Renal/GU      Musculoskeletal   Abdominal   Peds  Hematology   Anesthesia Other Findings   Reproductive/Obstetrics (+) Pregnancy Breech with cord presentation                             Anesthesia Physical Anesthesia Plan  ASA: II  Anesthesia Plan: Spinal   Post-op Pain Management:    Induction: Intravenous  PONV Risk Score and Plan: 2 and Treatment may vary due to age or medical condition  Airway Management Planned: Nasal Cannula  Additional Equipment:   Intra-op Plan:   Post-operative Plan:   Informed Consent: I have reviewed the patients History and Physical, chart, labs and discussed the procedure including the risks, benefits and alternatives for the proposed anesthesia with the patient or authorized representative who has indicated his/her understanding and acceptance.     Plan Discussed with: CRNA, Anesthesiologist and Surgeon  Anesthesia Plan Comments:         Anesthesia Quick Evaluation

## 2018-02-14 NOTE — Progress Notes (Signed)
Pt called out c/o leakage of fluid, RN to assess and found pt laying in a pool of clear fluid.   CNM on call informed, new order for amnisure placed.   RN will keep pt on bedrest and continuous monitoring.

## 2018-02-14 NOTE — Op Note (Signed)
Cesarean Section Procedure Note   CATHI HAZAN  01/09/2018 - 02/14/2018  Indications: Breech Presentation and PPROM, FUNIC PRESENTATION   Pre-operative Diagnosis: Footling Breech, Funic Presentation. UTERINE FIBROIDS  Post-operative Diagnosis: Same   Surgeon: Surgeon(s) and Role:    * Crawford Givens, MD - Primary   Assistants: Irene Shipper CNM   Anesthesia: spinal   Procedure Details:  The patient was seen in the Holding Room. The risks, benefits, complications, treatment options, and expected outcomes were discussed with the patient. The patient concurred with the proposed plan, giving informed consent. identified as Laura Cross and the procedure verified as C-Section Delivery. A Time Out was held and the above information confirmed.  After induction of anesthesia, the patient was draped and prepped in the usual sterile manner. A transverse incision was made and carried down through the subcutaneous tissue to the fascia. Fascial incision was made in the midline and extended transversely. The fascia was separated from the underlying rectus muscle superiorly and inferiorly. The peritoneum was identified and entered. Peritoneal incision was extended longitudinally with good visualization of bowel and bladder. The utero-vesical peritoneal reflection was incised transversely and the bladder flap was bluntly freed from the lower uterine segment.  An alexsis retractor was placed in the abdomen.   A low transverse uterine incision was made.  Bandage scissors were used to extend the incision. Delivered from breech footling presentation was a  infant, with Apgar scores of 8 at one minute and 8 at five minutes. Cord ph was sent the umbilical cord was clamped and cut cord blood was obtained for evaluation. The placenta was removed Intact and appeared normal. The uterine outline, tubes and ovaries appeared normal}. The uterine incision was closed with running locked sutures of 0Vicryl. A second layer  0 vicrlyl was used to imbricate the uterine incision    Hemostasis was observed. Lavage was carried out until clear. The alexsis was removed.  The peritoneum was closed with 0 chromic.  The muscles were examined and any bleeders were made hemostatic using bovie cautery device.   The fascia was then reapproximated with running sutures of 0 vicryl.  The subcutaneous tissue was reapproximated  With interrupted stitches using 2-0 plain gut. The subcuticular closure was performed using 3-51monocryl     Instrument, sponge, and needle counts were correct prior the abdominal closure and were correct at the conclusion of the case.    Findings: infant was delivered from footling breech presentation. The fluid was clear.  The uterus tubes and ovaries appeared normal.  Multiple large fibroids.  Cord around the feet   Estimated Blood Loss: 838m;   Total IV Fluids: 2027ml   Urine Output: 300CC OF clear urine  Specimens: placenta  Complications: no complications  Disposition: PACU - hemodynamically stable.   Maternal Condition: stable   Baby condition / location:  NICU  Attending Attestation: I performed the procedure.   Signed: Surgeon(s): Crawford Givens, MD

## 2018-02-14 NOTE — Anesthesia Procedure Notes (Signed)
Spinal  Patient location during procedure: OR Start time: 02/14/2018 5:18 AM End time: 02/14/2018 5:22 AM Staffing Anesthesiologist: Albertha Ghee, MD Performed: anesthesiologist  Preanesthetic Checklist Completed: patient identified, surgical consent, pre-op evaluation, timeout performed, IV checked, risks and benefits discussed and monitors and equipment checked Spinal Block Patient position: sitting Prep: DuraPrep Patient monitoring: cardiac monitor, continuous pulse ox and blood pressure Approach: midline Location: L3-4 Injection technique: single-shot Needle Needle type: Pencan  Needle gauge: 24 G Needle length: 9 cm Assessment Sensory level: T10 Additional Notes Functioning IV was confirmed and monitors were applied. Sterile prep and drape, including hand hygiene and sterile gloves were used. The patient was positioned and the spine was prepped. The skin was anesthetized with lidocaine.  Free flow of clear CSF was obtained prior to injecting local anesthetic into the CSF.  The spinal needle aspirated freely following injection.  The needle was carefully withdrawn.  The patient tolerated the procedure well.

## 2018-02-14 NOTE — Anesthesia Postprocedure Evaluation (Signed)
Anesthesia Post Note  Patient: Laura Cross  Procedure(s) Performed: CESAREAN SECTION (N/A )     Patient location during evaluation: Women's Unit Anesthesia Type: Spinal Level of consciousness: awake and alert Pain management: pain level controlled Vital Signs Assessment: post-procedure vital signs reviewed and stable Respiratory status: spontaneous breathing, nonlabored ventilation and respiratory function stable Cardiovascular status: stable Postop Assessment: no headache, no backache, able to ambulate, spinal receding, adequate PO intake, no apparent nausea or vomiting and patient able to bend at knees Anesthetic complications: no    Last Vitals:  Vitals:   02/14/18 1214 02/14/18 1610  BP: (!) 92/50 106/64  Pulse: 62 91  Resp: 17 17  Temp: 37 C 37.3 C  SpO2: 99% 99%    Last Pain:  Vitals:   02/14/18 1610  TempSrc: Oral  PainSc:    Pain Goal: Patients Stated Pain Goal: 3 (02/14/18 1045)               Zandon Talton Hristova

## 2018-02-14 NOTE — Progress Notes (Signed)
Pt BP 82/51, HR 67. Pt laying in bed, asymptomatic.  RN called midwife on call, no answer. RN left a voicemail. Will call back in 10 min.

## 2018-02-14 NOTE — Addendum Note (Signed)
Addendum  created 02/14/18 1706 by Hewitt Blade, CRNA   Sign clinical note

## 2018-02-14 NOTE — Progress Notes (Addendum)
Pt transported to OR 1

## 2018-02-14 NOTE — Progress Notes (Signed)
Patient ID: Laura Cross, female   DOB: [birth year], 32 y.o.   MRN: [id number]  Called secondary to pt with SROM BP 107/71 (BP Location: Left Arm)   Pulse (!) 102   Temp 98.3 F (36.8 C) (Oral)   Resp 18   Ht 5\' 1"  (1.549 m)   Wt 88.1 kg (194 lb 4 oz)   LMP 06/30/2017   SpO2 99%   BMI 36.70 kg/m  FHTS reassuring Cat 1  toco irreg Pt for CS R&B discussed with the pt in detail Pt at high risk for bleeding espescially with large anterior fibroids Type and cross for 2 units  Have methergine, hemabate and TXA available  Will monitor closely D/w anesthesia

## 2018-02-14 NOTE — Lactation Note (Deleted)
This note was copied from a baby's chart. Lactation Consultation Note  Patient Name: Laura Cross Today's Date: 02/14/2018     Maternal Data    Feeding Feeding Type: Donor Breast Milk  LATCH Score                   Interventions    Lactation Tools Discussed/Used     Consult Status      Laura Cross 02/14/2018, 5:25 PM

## 2018-02-14 NOTE — Anesthesia Postprocedure Evaluation (Signed)
Anesthesia Post Note  Patient: Laura Cross  Procedure(s) Performed: CESAREAN SECTION (N/A )     Patient location during evaluation: PACU Anesthesia Type: Spinal Level of consciousness: awake and alert, oriented and patient cooperative Pain management: pain level controlled Vital Signs Assessment: post-procedure vital signs reviewed and stable Respiratory status: spontaneous breathing, nonlabored ventilation and respiratory function stable Cardiovascular status: blood pressure returned to baseline and stable Postop Assessment: spinal receding, patient able to bend at knees and no apparent nausea or vomiting Anesthetic complications: no    Last Vitals:  Vitals:   02/14/18 0730 02/14/18 0745  BP: 111/65 104/73  Pulse: 69 72  Resp: 12 20  Temp:    SpO2: 97% 97%    Last Pain:  Vitals:   02/14/18 0745  TempSrc:   PainSc: 6    Pain Goal: Patients Stated Pain Goal: 3 (02/11/18 1951)               Seleta Rhymes. Edilberto Roosevelt

## 2018-02-15 ENCOUNTER — Encounter (HOSPITAL_COMMUNITY): Payer: Self-pay | Admitting: Obstetrics and Gynecology

## 2018-02-15 LAB — BPAM RBC
BLOOD PRODUCT EXPIRATION DATE: 201907252359
Blood Product Expiration Date: 201907252359
ISSUE DATE / TIME: 201906251406
ISSUE DATE / TIME: 201906251406
UNIT TYPE AND RH: 5100
UNIT TYPE AND RH: 5100

## 2018-02-15 LAB — TYPE AND SCREEN
ABO/RH(D): O POS
ANTIBODY SCREEN: NEGATIVE
UNIT DIVISION: 0
Unit division: 0

## 2018-02-15 LAB — CBC
HCT: 26.5 % — ABNORMAL LOW (ref 36.0–46.0)
Hemoglobin: 8.6 g/dL — ABNORMAL LOW (ref 12.0–15.0)
MCH: 28.4 pg (ref 26.0–34.0)
MCHC: 32.5 g/dL (ref 30.0–36.0)
MCV: 87.5 fL (ref 78.0–100.0)
Platelets: 175 K/uL (ref 150–400)
RBC: 3.03 MIL/uL — ABNORMAL LOW (ref 3.87–5.11)
RDW: 16.9 % — ABNORMAL HIGH (ref 11.5–15.5)
WBC: 5.8 K/uL (ref 4.0–10.5)

## 2018-02-15 MED ORDER — FERROUS SULFATE 325 (65 FE) MG PO TABS
325.0000 mg | ORAL_TABLET | Freq: Two times a day (BID) | ORAL | Status: DC
  Administered 2018-02-15 – 2018-02-17 (×4): 325 mg via ORAL
  Filled 2018-02-15 (×5): qty 1

## 2018-02-15 NOTE — Lactation Note (Signed)
This note was copied from a baby's chart. Lactation Consultation Note  Patient Name: Laura Cross Date: 02/15/2018   Mom reports she only pumped 1 time yesterday.  Reviewed importtance of pumping frequency to establish milk.  Mom reports she had breast growth during pregnancy.  Mom reports she has spent most of the day at baby's bedside.  Urged mom to get a pump set up and pump at baby's bedside so she does not have to miss pumpings.  Mom with questions on pump settings.  Reviewed pump settings with mom.  Mom denies breast or nipple pain.     Maternal Data    Feeding Feeding Type: Donor Breast Milk  LATCH Score                   Interventions    Lactation Tools Discussed/Used     Consult Status      Laura Cross 02/15/2018, 6:46 PM

## 2018-02-15 NOTE — Progress Notes (Signed)
Laura Cross [id number] Postpartum Postoperative Day # 0  Laura Cross, Laura Cross, Laura Cross, Laura Cross, Laura Cross, Laura presentation.   Subjective: Pt found sitting on the side of the bed and in stable condition. Patient up ad lib, denies syncope or dizziness. Reports consuming regular diet without issues and denies N/V. Patient reports 0 bowel movement + passing flatus.  Denies issues with urination and reports bleeding is "small."  Patient is Pumping for feeding and reports going well, denies having any milk in yet for infant in NICU. Infant is doing well with feeding tube but stable.  Desires undecided for postpartum contraception.  Pain is being appropriately managed with use of ketorolac/tylenol/percocet.    Objective: Patient Vitals for the past 24 hrs:  BP Temp Temp src Pulse Resp SpO2  02/15/18 1158 108/61 98.1 F (36.7 C) Oral 79 18 100 %  02/15/18 0800 (!) 96/50 98.7 F (37.1 C) Oral 88 18 99 %  02/15/18 0424 125/78 97.6 F (36.4 C) Oral 69 19 100 %  02/15/18 0033 (!) 90/50 - - - - -  02/14/18 2335 (!) 82/51 98.1 F (36.7 C) Oral 67 16 99 %  02/14/18 2018 (!) 108/58 (!) 97.2 F (36.2 C) Oral (!) 57 16 100 %  02/14/18 1610 106/64 99.2 F (37.3 C) Oral 91 17 99 %     Physical Exam:  General: alert, cooperative, appears stated age and no distress Mood/Affect: Happy Lungs: clear to auscultation, no wheezes, rales or rhonchi, symmetric air entry.  Heart: normal rate, regular rhythm, normal S1, S2, no murmurs, rubs, clicks or gallops. Breast: breasts appear normal, no suspicious masses, no skin or nipple changes or axillary nodes. Abdomen:  + bowel sounds, soft, non-tender Incision: healing well, no significant drainage, no dehiscence, no significant erythema, Honeycomb dressing  Uterine Fundus: firm, involution -2 Lochia: appropriate Skin: Warm, Dry. DVT Evaluation: No evidence of DVT seen on physical exam. Negative Homan's sign. No cords  or calf tenderness. No significant calf/ankle edema.  Labs: Recent Labs    02/15/18 0824  HGB 8.6*  HCT 26.5*  WBC 5.8    CBG (last 3)  No results for input(s): GLUCAP in the last 72 hours.   I/O: I/O last 3 completed shifts: In: 2100 [I.V.:2100] Out: 2961 [Urine:2075; Blood:886]   Assessment Postpartum Postoperative Day # 37. Laura Cross, Laura Cross, Laura Cross, Laura Cross, Laura Cross, Laura presentation.  Pt stable. -2 Involution. Pumping for Feeding infant in NICU. Pt has PP anemia Hemodynamically Stable.  Plan: Continue other mgmt as ordered VTE Prophylactics: SCD, ambulated as tolerates.  Pain control: Motrin/Tylenol/Narcotics PRN Anemia: Started iron PO BID Education given regarding options for contraception. Breastfeeding and Lactation consult  Dr. Alwyn Pea to be updated on patient status  Hailie Searight NP-C, CNM 02/15/2018, 12:42 PM

## 2018-02-16 LAB — RPR: RPR Ser Ql: NONREACTIVE

## 2018-02-16 MED ORDER — POLYETHYLENE GLYCOL 3350 17 G PO PACK
17.0000 g | PACK | Freq: Every day | ORAL | Status: DC
  Administered 2018-02-16: 17 g via ORAL
  Filled 2018-02-16 (×2): qty 1

## 2018-02-16 NOTE — Lactation Note (Signed)
This note was copied from a baby's chart. Lactation Consultation Note  Patient Name: Laura Cross JSEGB'T Date: 02/16/2018 Reason for consult: Follow-up assessment Mom reports she has only pumped one time today.  Mom reports that she knows she needs to get on a schedule for pumping.  Reviewed frequency of pumping is what makes more milk come.  Urged her to pump at baby's bed side and lots of sts.  Maternal Data    Feeding Feeding Type: Donor Breast Milk  LATCH Score                   Interventions    Lactation Tools Discussed/Used     Consult Status Consult Status: Follow-up Date: 02/17/18 Follow-up type: In-patient    [facility] Caul 02/16/2018, 5:17 PM

## 2018-02-16 NOTE — Progress Notes (Signed)
Subjective: Postpartum Day 1: Cesarean Delivery Patient reports incisional pain, tolerating PO, + flatus and no problems voiding.  Asymptomatic for anemia. Reports baby doing well in NICU. Family visiting today.   Objective: Vitals:   02/15/18 1606 02/15/18 2041 02/16/18 0551 02/16/18 0758  BP: 101/63 107/60 115/74 116/67  Pulse: 90 89 88 76  Resp: 18 18 17 18   Temp: 98.3 F (36.8 C) 98.1 F (36.7 C) 98.1 F (36.7 C) 97.8 F (36.6 C)  TempSrc: Oral Oral Oral Oral  SpO2: 100% 100% 100% 97%  Weight:      Height:         Physical Exam:  General: alert and cooperative Lochia: appropriate Uterine Fundus: firm Incision: healing well, no significant drainage, no dehiscence, no significant erythema DVT Evaluation: No evidence of DVT seen on physical exam. Negative Homan's sign. No cords or calf tenderness. No significant calf/ankle edema.  Recent Labs    02/15/18 0824  HGB 8.6*  HCT 26.5*    Assessment/Plan: Status post Cesarean section. Doing well postoperatively.  Continue current care. PO Iron BID for anemia, added stool softener to prevent constipation from Iron and pain medication  Undecided on birth control   [name] 02/16/2018, 8:13 AM

## 2018-02-17 MED ORDER — OXYCODONE HCL 5 MG PO TABS: 5.0000 mg | ORAL_TABLET | Freq: Four times a day (QID) | ORAL | 0 refills | Status: AC | PRN

## 2018-02-17 MED ORDER — ACETAMINOPHEN 500 MG PO TABS: 500.0000 mg | ORAL_TABLET | Freq: Four times a day (QID) | ORAL | 0 refills | Status: DC | PRN

## 2018-02-17 MED ORDER — IBUPROFEN 600 MG PO TABS: 600.0000 mg | ORAL_TABLET | Freq: Four times a day (QID) | ORAL | 0 refills | Status: DC | PRN

## 2018-02-17 NOTE — Progress Notes (Signed)
Discharge instructions given to patient included postpartum information, medication changes and follow up appointments.   Patient verbalized understanding

## 2018-02-17 NOTE — Discharge Summary (Signed)
OB Discharge Summary     Patient Name: Laura Cross DOB: 03-17-1986 MRN: [id number]  Date of admission: 01/09/2018 Delivering MD: Crawford Givens   Date of discharge: 02/17/2018  Admitting diagnosis: 32WKS, CRAMPING,LOWER ABD PAIN Intrauterine pregnancy: [redacted]w[redacted]d     Secondary diagnosis:  Active Problems:   Cervical shortening in second trimester   Preterm labor   Abdominal pain   Uterine fibroids affecting pregnancy in third trimester   [redacted] weeks gestation of pregnancy     Discharge diagnosis: Preterm Pregnancy Delivered                                                                                                Post partum procedures:n/a  Augmentation: n/a  Complications: None  Hospital course:  Sceduled C/S   32 y.o. yo [name] at [redacted]w[redacted]d was admitted to the hospital 01/09/2018 for scheduled cesarean section with the following indication:Malpresentation and Cord Prolapse.  Membrane Rupture Time/Date: 3:00 AM ,02/14/2018   Patient delivered a Viable infant.02/14/2018  Details of operation can be found in separate operative note.  Pateint had an uncomplicated postpartum course.  She is ambulating, tolerating a regular diet, passing flatus, and urinating well. Patient is discharged home in stable condition on  02/17/18         Physical exam  Vitals:   02/16/18 2056 02/16/18 2340 02/17/18 0453 02/17/18 0836  BP: 115/74 108/63 (!) 89/57 113/79  Pulse: 75 84 90 73  Resp: 20 16 18 18   Temp: 99 F (37.2 C) 98 F (36.7 C) 98.6 F (37 C) 98.2 F (36.8 C)  TempSrc: Oral Oral Oral Oral  SpO2: 99% 100%  100%  Weight:      Height:       General: alert, cooperative and no distress Lochia: appropriate Uterine Fundus: firm Incision: Healing well with no significant drainage, No significant erythema, Dressing is clean, dry, and intact DVT Evaluation: No evidence of DVT seen on physical exam. Negative Homan's sign. No cords or calf tenderness. No significant calf/ankle  edema. Labs: Lab Results  Component Value Date   WBC 5.8 02/15/2018   HGB 8.6 (L) 02/15/2018   HCT 26.5 (L) 02/15/2018   MCV 87.5 02/15/2018   PLT 175 02/15/2018   CMP Latest Ref Rng & Units 01/19/2018  Glucose mg/dL 85  BUN 6 - 20 mg/dL -  Creatinine 0.44 - 1.00 mg/dL -  Sodium 135 - 145 mmol/L -  Potassium 3.5 - 5.1 mmol/L -  Chloride 101 - 111 mmol/L -  CO2 22 - 32 mmol/L -  Calcium 8.9 - 10.3 mg/dL -  Total Protein 6.5 - 8.1 g/dL -  Total Bilirubin 0.3 - 1.2 mg/dL -  Alkaline Phos 38 - 126 U/L -  AST 15 - 41 U/L -  ALT 14 - 54 U/L -    Discharge instruction: per After Visit Summary and "Baby and Me Booklet".  After visit meds:  Allergies as of 02/17/2018      Reactions   Meloxicam Other (See Comments)   Bleeding?  Can tolerate other NSAIDs   Metformin And Related Diarrhea,  Other (See Comments)   hair falling out   Nuvaring [etonogestrel-ethinyl Estradiol] Other (See Comments)   Flu-like symptoms      Medication List    STOP taking these medications   NIFEdipine 10 MG capsule Commonly known as:  PROCARDIA   progesterone 200 MG Supp     TAKE these medications   acetaminophen 500 MG tablet Commonly known as:  TYLENOL Take 1 tablet (500 mg total) by mouth every 6 (six) hours as needed for mild pain. What changed:    how much to take  reasons to take this   ferrous sulfate 325 (65 FE) MG tablet Take 325 mg by mouth daily with breakfast.   ibuprofen 600 MG tablet Commonly known as:  ADVIL,MOTRIN Take 1 tablet (600 mg total) by mouth every 6 (six) hours as needed for moderate pain or cramping.   oxyCODONE 5 MG immediate release tablet Commonly known as:  Oxy IR/ROXICODONE Take 1 tablet (5 mg total) by mouth every 6 (six) hours as needed for up to 7 days for severe pain.   prenatal multivitamin Tabs tablet Take 1 tablet by mouth daily at 12 noon.       Diet: routine diet  Activity: Advance as tolerated. Pelvic rest for 6 weeks.   Outpatient  follow up:6 weeks Follow up Appt:No future appointments. Follow up Visit:No follow-ups on file.  Postpartum contraception: Undecided  Newborn Data: Live born female  Birth Weight: 3 lb 10.2 oz (1650 g) APGAR: 8, 8  Newborn Delivery   Birth date/time:  02/14/2018 05:46:00 Delivery type:  C-Section, Low Transverse Trial of labor:  No C-section categorization:  Primary     Baby Feeding: Bottle and Breast Disposition:NICU   02/17/2018 [name], CNM

## 2018-02-17 NOTE — Discharge Instructions (Signed)
Contraception Choices Contraception, also called birth control, means things to use or ways to try not to get pregnant. Hormonal birth control This kind of birth control uses hormones. Here are some types of hormonal birth control:  A tube that is put under skin of the arm (implant). The tube can stay in for as long as 3 years.  Shots to get every 3 months (injections).  Pills to take every day (birth control pills).  A patch to change 1 time each week for 3 weeks (birth control patch). After that, the patch is taken off for 1 week.  A ring to put in the vagina. The ring is left in for 3 weeks. Then it is taken out of the vagina for 1 week. Then a new ring is put in.  Pills to take after unprotected sex (emergency birth control pills).  Barrier birth control Here are some types of barrier birth control:  A thin covering that is put on the penis before sex (female condom). The covering is thrown away after sex.  A soft, loose covering that is put in the vagina before sex (female condom). The covering is thrown away after sex.  A rubber bowl that sits over the cervix (diaphragm). The bowl must be made for you. The bowl is put into the vagina before sex. The bowl is left in for 6-8 hours after sex. It is taken out within 24 hours.  A small, soft cup that fits over the cervix (cervical cap). The cup must be made for you. The cup can be left in for 6-8 hours after sex. It is taken out within 48 hours.  A sponge that is put into the vagina before sex. It must be left in for at least 6 hours after sex. It must be taken out within 30 hours. Then it is thrown away.  A chemical that kills or stops sperm from getting into the uterus (spermicide). It may be a pill, cream, jelly, or foam to put in the vagina. The chemical should be used at least 10-15 minutes before sex.  IUD (intrauterine) birth control An IUD is a small, T-shaped piece of plastic. It is put inside the uterus. There are two  kinds:  Hormone IUD. This kind can stay in for 3-5 years.  Copper IUD. This kind can stay in for 10 years.  Permanent birth control Here are some types of permanent birth control:  Surgery to block the fallopian tubes.  Having an insert put into each fallopian tube.  Surgery to tie off the tubes that carry sperm (vasectomy).  Natural planning birth control Here are some types of natural planning birth control:  Not having sex on the days the woman could get pregnant.  Using a calendar: ? To keep track of the length of each period. ? To find out what days pregnancy can happen. ? To plan to not have sex on days when pregnancy can happen.  Watching for symptoms of ovulation and not having sex during ovulation. One way the woman can check for ovulation is to check her temperature.  Waiting to have sex until after ovulation.  Summary  Contraception, also called birth control, means things to use or ways to try not to get pregnant.  Hormonal methods of birth control include implants, injections, pills, patches, vaginal rings, and emergency birth control pills.  Barrier methods of birth control can include female condoms, female condoms, diaphragms, cervical caps, sponges, and spermicides.  There are two types of  IUD (intrauterine device) birth control. An IUD can be put in a woman's uterus to prevent pregnancy for 3-5 years.  Permanent sterilization can be done through a procedure for males, females, or both.  Natural planning methods involve not having sex on the days when the woman could get pregnant. This information is not intended to replace advice given to you by your health care provider. Make sure you discuss any questions you have with your health care provider. Document Released: 05/24/2009 Document Revised: 08/06/2016 Document Reviewed: 08/06/2016 Elsevier Interactive Patient Education  2017 [employer].  Postpartum Care After Cesarean Delivery The period of time  right after you deliver your newborn is called the postpartum period. What kind of medical care will I receive?  You may continue to receive fluids and medicines through an IV tube inserted into one of your veins.  You may have small, flexible tube (catheter) draining urine from your bladder into a bag outside of your body. The catheter will be removed as soon as possible.  You may be given a squirt bottle to use when you go to the bathroom. You may use this until you are comfortable wiping as usual. To use the squirt bottle, follow these steps: ? Before you urinate, fill the squirt bottle with warm water. The water should be warm. Do not use hot water. ? After you urinate, while you are sitting on the toilet, use the squirt bottle to rinse the area around your urethra and vaginal opening. This rinses away any urine and blood. ? You may do this instead of wiping. As you start healing, you may use the squirt bottle before wiping yourself. Make sure to wipe gently. ? Fill the squirt bottle with clean water every time you use the bathroom.  You will be given sanitary pads to wear.  Your incision will be monitored to make sure it is healing properly. You will be told when it is safe for your stitches, staples, or skin adhesive tape to be removed. What can I expect?  You may not feel the need to urinate for several hours after delivery.  You will have some soreness and pain in your abdomen. You may have a small amount of blood or clear fluid coming from your incision.  If you are breastfeeding, you may have uterine contractions every time you breastfeed for up to several weeks postpartum. Uterine contractions help your uterus return to its normal size.  It is normal to have vaginal bleeding (lochia) after delivery. The amount and appearance of lochia is often similar to a menstrual period in the first week after delivery. It will gradually decrease over the next few weeks to a dry, yellow-brown  discharge. For most women, lochia stops completely by 6-8 weeks after delivery. Vaginal bleeding can vary from woman to woman.  Within the first few days after delivery, you may have breast engorgement. This is when your breasts feel heavy, full, and uncomfortable. Your breasts may also throb and feel hard, tightly stretched, warm, and tender. After this occurs, you may have milk leaking from your breasts.Your health care provider can help you relieve discomfort due to breast engorgement. Breast engorgement should go away within a few days.  You may feel more sad or worried than normal due to hormonal changes after delivery. These feelings should not last more than a few days. If these feelings do not go away after several days, speak with your health care provider. How should I care for myself?  Tell  your health care provider if you have pain or discomfort.  Drink enough water to keep your urine clear or pale yellow.  Wash your hands thoroughly with soap and water for at least 20 seconds after changing your sanitary pads or using the toilet, and before holding or feeding your baby.  If you are not breastfeeding, avoid touching your breasts a lot. Doing this can make your breasts produce more milk.  If you become weak or lightheaded, or you feel like you might faint, ask for help before: ? Getting out of bed. ? Showering.  Change your sanitary pads frequently. Watch for any changes in your flow, such as a sudden increase in volume, a change in color, or the passing of large blood clots. If you pass a blood clot from your vagina, save it to show to your health care provider. Do not flush blood clots down the toilet without having your health care provider look at them.  Make sure that all your vaccinations are up to date. This can help protect you and your baby from getting certain diseases. You may need to have immunizations done before you leave the hospital.  If desired, talk with your  health care provider about methods of family planning or birth control (contraception). How can I start bonding with my baby? Spending as much time as possible with your baby is very important. During this time, you and your baby can get to know each other and develop a bond. Having your baby stay with you in your room (rooming in) can give you time to get to know your baby. Rooming in can also help you become comfortable caring for your baby. Breastfeeding can also help you bond with your baby. How can I plan for returning home with my baby?  Make sure that you have a car seat installed in your vehicle. ? Your car seat should be checked by a certified car seat installer to make sure that it is installed safely. ? Make sure that your baby fits into the car seat safely.  Ask your health care provider any questions you have about caring for yourself or your baby. Make sure that you are able to contact your health care provider with any questions after leaving the hospital. This information is not intended to replace advice given to you by your health care provider. Make sure you discuss any questions you have with your health care provider. Document Released: 04/20/2012 Document Revised: 12/30/2015 Document Reviewed: 07/01/2015 Elsevier Interactive Patient Education  2018 [employer]. Postpartum Depression and Baby Blues The postpartum period begins right after the birth of a baby. During this time, there is often a great amount of joy and excitement. It is also a time of many changes in the life of the parents. Regardless of how many times a mother gives birth, each child brings new challenges and dynamics to the family. It is not unusual to have feelings of excitement along with confusing shifts in moods, emotions, and thoughts. All mothers are at risk of developing postpartum depression or the "baby blues." These mood changes can occur right after giving birth, or they may occur many months after  giving birth. The baby blues or postpartum depression can be mild or severe. Additionally, postpartum depression can go away rather quickly, or it can be a long-term condition. What are the causes? Raised hormone levels and the rapid drop in those levels are thought to be a main cause of postpartum depression and the baby  blues. A number of hormones change during and after pregnancy. Estrogen and progesterone usually decrease right after the delivery of your baby. The levels of thyroid hormone and various cortisol steroids also rapidly drop. Other factors that play a role in these mood changes include major life events and genetics. What increases the risk? If you have any of the following risks for the baby blues or postpartum depression, know what symptoms to watch out for during the postpartum period. Risk factors that may increase the likelihood of getting the baby blues or postpartum depression include:  Having a personal or family history of depression.  Having depression while being pregnant.  Having premenstrual mood issues or mood issues related to oral contraceptives.  Having a lot of life stress.  Having marital conflict.  Lacking a social support network.  Having a baby with special needs.  Having health problems, such as diabetes.  What are the signs or symptoms? Symptoms of baby blues include:  Brief changes in mood, such as going from extreme happiness to sadness.  Decreased concentration.  Difficulty sleeping.  Crying spells, tearfulness.  Irritability.  Anxiety.  Symptoms of postpartum depression typically begin within the first month after giving birth. These symptoms include:  Difficulty sleeping or excessive sleepiness.  Marked weight loss.  Agitation.  Feelings of worthlessness.  Lack of interest in activity or food.  Postpartum psychosis is a very serious condition and can be dangerous. Fortunately, it is rare. Displaying any of the following  symptoms is cause for immediate medical attention. Symptoms of postpartum psychosis include:  Hallucinations and delusions.  Bizarre or disorganized behavior.  Confusion or disorientation.  How is this diagnosed? A diagnosis is made by an evaluation of your symptoms. There are no medical or lab tests that lead to a diagnosis, but there are various questionnaires that a health care provider may use to identify those with the baby blues, postpartum depression, or psychosis. Often, a screening tool called the [place] Postnatal Depression Scale is used to diagnose depression in the postpartum period. How is this treated? The baby blues usually goes away on its own in 1-2 weeks. Social support is often all that is needed. You will be encouraged to get adequate sleep and rest. Occasionally, you may be given medicines to help you sleep. Postpartum depression requires treatment because it can last several months or longer if it is not treated. Treatment may include individual or group therapy, medicine, or both to address any social, physiological, and psychological factors that may play a role in the depression. Regular exercise, a healthy diet, rest, and social support may also be strongly recommended. Postpartum psychosis is more serious and needs treatment right away. Hospitalization is often needed. Follow these instructions at home:  Get as much rest as you can. Nap when the baby sleeps.  Exercise regularly. Some women find yoga and walking to be beneficial.  Eat a balanced and nourishing diet.  Do little things that you enjoy. Have a cup of tea, take a bubble bath, read your favorite magazine, or listen to your favorite music.  Avoid alcohol.  Ask for help with household chores, cooking, grocery shopping, or running errands as needed. Do not try to do everything.  Talk to people close to you about how you are feeling. Get support from your partner, family members, friends, or other new  moms.  Try to stay positive in how you think. Think about the things you are grateful for.  Do not spend a lot  of time alone.  Only take over-the-counter or prescription medicine as directed by your health care provider.  Keep all your postpartum appointments.  Let your health care provider know if you have any concerns. Contact a health care provider if: You are having a reaction to or problems with your medicine. Get help right away if:  You have suicidal feelings.  You think you may harm the baby or someone else. This information is not intended to replace advice given to you by your health care provider. Make sure you discuss any questions you have with your health care provider. Document Released: 04/30/2004 Document Revised: 01/02/2016 Document Reviewed: 05/08/2013 Elsevier Interactive Patient Education  2017 [employer].

## 2018-02-23 ENCOUNTER — Telehealth: Payer: Self-pay | Admitting: Medical

## 2018-02-23 NOTE — Telephone Encounter (Signed)
Patient states that she is doing good just getting home after being in the hospital for 2 months.   She had the baby on July 8, and she is still in the NICU.  She says thank you so much for checking on her, she is getting back in the swing of things.

## 2018-02-23 NOTE — Telephone Encounter (Signed)
Call and see how she is doing?  Tell her congratulations on her new baby!

## 2018-02-24 ENCOUNTER — Ambulatory Visit: Payer: Self-pay

## 2018-02-24 NOTE — Lactation Note (Signed)
This note was copied from a baby's chart. Lactation Consultation Note: Called to see mom in NICU. She has been pumping but is discouraged because she is not getting very much milk. Pumped 4 times yesterday and obtaining 1 oz EBM at most. Has not pumped yet today. Did not bring pump pieces with her to pump here. States baby is getting some formula because she is not making enough milk  She was thinking about going to just formula. Reviewed that some breast milk is better than no breast milk. She does want baby to get her milk. She does not want to put baby to the breast- her plan is to pump and bottle feed EBM. HAD BREAST REDUCTION. Plans to come tomorrow.wants to make sure she is doing it right. To call in the morning and let us know what time she will be here so we can help her. No further questions at present.   Patient Name: [name] WGNFA'O Date: 02/24/2018     Maternal Data    Feeding    LATCH Score                   Interventions    Lactation Tools Discussed/Used     Consult Status      Laura Cross 02/24/2018, 10:57 AM

## 2018-05-06 ENCOUNTER — Ambulatory Visit: Payer: 59 | Admitting: Family Medicine

## 2018-05-06 ENCOUNTER — Encounter: Payer: Self-pay | Admitting: Family Medicine

## 2018-05-06 ENCOUNTER — Ambulatory Visit (INDEPENDENT_AMBULATORY_CARE_PROVIDER_SITE_OTHER): Payer: BLUE CROSS/BLUE SHIELD | Admitting: Family Medicine

## 2018-05-06 VITALS — BP 120/70 | HR 62 | Temp 97.7°F | Wt 186.0 lb

## 2018-05-06 DIAGNOSIS — G8929 Other chronic pain: Secondary | ICD-10-CM

## 2018-05-06 DIAGNOSIS — M79644 Pain in right finger(s): Secondary | ICD-10-CM

## 2018-05-06 DIAGNOSIS — M79646 Pain in unspecified finger(s): Secondary | ICD-10-CM

## 2018-05-06 NOTE — Progress Notes (Signed)
   Subjective:    Patient ID: Laura Cross, female    DOB: Aug 12, 1985, 32 y.o.   MRN: [id number]  HPI Chief Complaint  Patient presents with  . wrist pain    wrist pain since july since gving birth. popping out of place    She is here with complaints of bilateral wrist pain near the base of her thumbs since July. States pain started after delivering her child. States she occasionally has a sharp pain at the base of her right and left thumbs.   States she feels her wrists "popping out of place at least 4 times per week". The sensation only lasts for a few seconds. Occasional brief periods of pain but not always. Typing makes her pain worse.   Reports numbness in her thumbs on 1-2 occasions lasting a few seconds.  No other numbness, weakness.   She has not tried anything for her symptoms.  She is not breast feeding.   No fever, chills, chest pain, abdominal pain, N/V/D. No other arthralgias or myalgias.   Reviewed allergies, medications, past medical, surgical, family, and social history.    Review of Systems Pertinent positives and negatives in the history of present illness.     Objective:   Physical Exam BP 120/70   Pulse 62   Temp 97.7 F (36.5 C) (Oral)   Wt 186 lb (84.4 kg)   LMP 04/29/2018   Breastfeeding? No   BMI 35.14 kg/m   Alert and in no distress. Bilateral hands with normal sensation, ROM and strength. Equal grip strength. No CMC tenderness. No erythema, edema or hand tenderness. Positive finkelstein test. Negative Tinels and Phalens.       Assessment & Plan:  Chronic thumb pain, bilateral  No sign of CTS. Discussed conservative treatment with anti-inflammatories, topical analgesia, ice or even the use of a splint.  Follow-up as needed

## 2018-05-06 NOTE — Patient Instructions (Signed)
Try taking 1 or 2 Aleve twice daily with food.  You may also try an over-the-counter topical pain medicine such as Arnica gel or capsaicin cream. You may also try a wrist splint as discussed to see if this helps.  If you are not noticing significant improvement in the next couple of weeks, or if your symptoms get worse then let us know.  We will consider x-rays if needed.  De Quervain Tenosynovitis Tendons attach muscles to bones. They also help with joint movements. When tendons become irritated or swollen, it is called tendinitis. The extensor pollicis brevis (EPB) tendon connects the EPB muscle to a bone that is near the base of the thumb. The EPB muscle helps to straighten and extend the thumb. De Quervain tenosynovitis is a condition in which the EPB tendon lining (sheath) becomes irritated, thickened, and swollen. This condition is sometimes called stenosing tenosynovitis. This condition causes pain on the thumb side of the back of the wrist. What are the causes? Causes of this condition include:  Activities that repeatedly cause your thumb and wrist to extend.  A sudden increase in activity or change in activity that affects your wrist.  What increases the risk? This condition is more likely to develop in:  Females.  People who have diabetes.  Women who have recently given birth.  People who are over 44 years of age.  People who do activities that involve repeated hand and wrist motions, such as tennis, racquetball, volleyball, gardening, and taking care of children.  People who do heavy labor.  People who have poor wrist strength and flexibility.  People who do not warm up properly before activities.  What are the signs or symptoms? Symptoms of this condition include:  Pain or tenderness over the thumb side of the back of the wrist when your thumb and wrist are not moving.  Pain that gets worse when you straighten your thumb or extend your thumb or wrist.  Pain when  the injured area is touched.  Locking or catching of the thumb joint while you bend and straighten your thumb.  Decreased thumb motion due to pain.  Swelling over the affected area.  How is this diagnosed? This condition is diagnosed with a medical history and physical exam. Your health care provider will ask for details about your injury and ask about your symptoms. How is this treated? Treatment may include the use of icing and medicines to reduce pain and swelling. You may also be advised to wear a splint or brace to limit your thumb and wrist motion. In less severe cases, treatment may also include working with a physical therapist to strengthen your wrist and calm the irritation around your EPB tendon sheath. In severe cases, surgery may be needed. Follow these instructions at home: If you have a splint or brace:  Wear it as told by your health care provider. Remove it only as told by your health care provider.  Loosen the splint or brace if your fingers become numb and tingle, or if they turn cold and blue.  Keep the splint or brace clean and dry. Managing pain, stiffness, and swelling  If directed, apply ice to the injured area. ? Put ice in a plastic bag. ? Place a towel between your skin and the bag. ? Leave the ice on for 20 minutes, 2-3 times per day.  Move your fingers often to avoid stiffness and to lessen swelling.  Raise (elevate) the injured area above the level of your  heart while you are sitting or lying down. General instructions  Return to your normal activities as told by your health care provider. Ask your health care provider what activities are safe for you.  Take over-the-counter and prescription medicines only as told by your health care provider.  Keep all follow-up visits as told by your health care provider. This is important.  Do not drive or operate heavy machinery while taking prescription pain medicine. Contact a health care provider if:  Your  pain, tenderness, or swelling gets worse, even if you have had treatment.  You have numbness or tingling in your wrist, hand, or fingers on the injured side. This information is not intended to replace advice given to you by your health care provider. Make sure you discuss any questions you have with your health care provider. Document Released: 07/27/2005 Document Revised: 01/02/2016 Document Reviewed: 10/02/2014 Elsevier Interactive Patient Education  [employer].

## 2018-05-12 DIAGNOSIS — D259 Leiomyoma of uterus, unspecified: Secondary | ICD-10-CM | POA: Diagnosis not present

## 2018-05-12 DIAGNOSIS — Z09 Encounter for follow-up examination after completed treatment for conditions other than malignant neoplasm: Secondary | ICD-10-CM | POA: Diagnosis not present

## 2018-05-30 ENCOUNTER — Encounter (HOSPITAL_COMMUNITY): Payer: Self-pay | Admitting: Emergency Medicine

## 2018-05-30 ENCOUNTER — Ambulatory Visit (HOSPITAL_COMMUNITY)
Admission: EM | Admit: 2018-05-30 | Discharge: 2018-05-30 | Disposition: A | Discharge status: Nursing Home (Includes ICF) | Payer: BLUE CROSS/BLUE SHIELD | Source: Home / Self Care | Attending: Family Medicine | Admitting: Family Medicine

## 2018-05-30 ENCOUNTER — Emergency Department (HOSPITAL_COMMUNITY)
Admission: EM | Admit: 2018-05-30 | Discharge: 2018-05-30 | Disposition: A | Discharge status: Home / Self Care | Payer: BLUE CROSS/BLUE SHIELD | Attending: Emergency Medicine | Admitting: Emergency Medicine

## 2018-05-30 ENCOUNTER — Other Ambulatory Visit: Payer: Self-pay

## 2018-05-30 ENCOUNTER — Emergency Department (HOSPITAL_COMMUNITY): Payer: BLUE CROSS/BLUE SHIELD

## 2018-05-30 ENCOUNTER — Ambulatory Visit (INDEPENDENT_AMBULATORY_CARE_PROVIDER_SITE_OTHER): Payer: BLUE CROSS/BLUE SHIELD

## 2018-05-30 DIAGNOSIS — R079 Chest pain, unspecified: Secondary | ICD-10-CM | POA: Diagnosis not present

## 2018-05-30 DIAGNOSIS — R1013 Epigastric pain: Secondary | ICD-10-CM

## 2018-05-30 DIAGNOSIS — Z5321 Procedure and treatment not carried out due to patient leaving prior to being seen by health care provider: Secondary | ICD-10-CM | POA: Diagnosis not present

## 2018-05-30 LAB — I-STAT TROPONIN, ED: Troponin i, poc: 0 ng/mL (ref 0.00–0.08)

## 2018-05-30 LAB — BASIC METABOLIC PANEL
Anion gap: 12 (ref 5–15)
BUN: 18 mg/dL (ref 6–20)
CHLORIDE: 99 mmol/L (ref 98–111)
CO2: 24 mmol/L (ref 22–32)
CREATININE: 0.83 mg/dL (ref 0.44–1.00)
Calcium: 9.5 mg/dL (ref 8.9–10.3)
GFR calc Af Amer: >60 mL/min (ref >60)
GFR calc non Af Amer: >60 mL/min (ref >60)
Glucose, Bld: 99 mg/dL (ref 70–99)
Potassium: 3.9 mmol/L (ref 3.5–5.1)
Sodium: 135 mmol/L (ref 135–145)

## 2018-05-30 LAB — CBC
HEMATOCRIT: 38.8 % (ref 36.0–46.0)
Hemoglobin: 12.1 g/dL (ref 12.0–15.0)
MCH: 25.9 pg — ABNORMAL LOW (ref 26.0–34.0)
MCHC: 31.2 g/dL (ref 30.0–36.0)
MCV: 83.1 fL (ref 80.0–100.0)
Platelets: 317 10*3/uL (ref 150–400)
RBC: 4.67 MIL/uL (ref 3.87–5.11)
RDW: 14.4 % (ref 11.5–15.5)
WBC: 5.5 10*3/uL (ref 4.0–10.5)
nRBC: 0 % (ref 0.0–0.2)

## 2018-05-30 LAB — I-STAT BETA HCG BLOOD, ED (MC, WL, AP ONLY): I-stat hCG, quantitative: <5 m[IU]/mL (ref <5)

## 2018-05-30 MED ORDER — ALUM & MAG HYDROXIDE-SIMETH 200-200-20 MG/5ML PO SUSP
30.0000 mL | Freq: Once | ORAL | Status: AC
  Administered 2018-05-30: 30 mL via ORAL

## 2018-05-30 MED ORDER — ALUM & MAG HYDROXIDE-SIMETH 200-200-20 MG/5ML PO SUSP
ORAL | Status: AC
  Filled 2018-05-30: qty 30

## 2018-05-30 MED ORDER — LIDOCAINE VISCOUS HCL 2 % MT SOLN
15.0000 mL | Freq: Once | OROMUCOSAL | Status: AC
  Administered 2018-05-30: 15 mL via ORAL

## 2018-05-30 MED ORDER — LIDOCAINE VISCOUS HCL 2 % MT SOLN
OROMUCOSAL | Status: AC
  Filled 2018-05-30: qty 15

## 2018-05-30 NOTE — ED Provider Notes (Signed)
[facility]    CSN: [id number] Arrival date & time: 05/30/18  1808     History   Chief Complaint Chief Complaint  Patient presents with  . Chest Pain    HPI Laura Cross is a 32 y.o. female.   Laura Cross presents with complaints of epigastric/chest pain which started today at approximately 5p and has lasted since, is improving. States has had approximately 4 episodes in the past 2 months of similar. Random in nature she feels. Tonight felt diaphoretic and with nausea. No vomiting. These have improved but still with the pain. It is sharp and tight. Typically lasts approximately 1-2 hours. Ibuprofen has helped in the past. She feels shortness of breath  During episodes. Some dizziness. No jaw, back or arm pain. Changes of position does help. Gave birth in July of this year- c section. No leg pain or swelling. Doesn't smoke. No birth control. She has been taking Lysteda for her heavy periods. Stopped yesterday. States has had anxiety attacks in the past but this does not necessarily feel similar. No significant or obvious stressors but does have a new baby. She is not breastfeeding. Denies any cardiac history. She thinks her father may have had some cardiac history.    ROS per HPI.      Past Medical History:  Diagnosis Date  . Bacterial infection   . Family history of premature CAD    father  . Fibroids   . Herpes    genital  . Impaired fasting blood sugar   . Miscarriage 2014  . Mixed dyslipidemia   . Ovarian cyst   . Sexually transmitted disease (STD)    hx/o HPV, gonorrhea, chlamydia, genital herpes  . Syphilis 2010  . Yeast infection     Patient Active Problem List   Diagnosis Date Noted  . Abdominal pain   . Uterine fibroids affecting pregnancy in third trimester   . [redacted] weeks gestation of pregnancy   . Non-stress test with decelerations   . [redacted] weeks gestation of pregnancy   . Preterm labor 12/27/2017  . [redacted] weeks gestation of pregnancy   .  Presentation of cord   . Cervical shortening in second trimester 12/17/2017  . Influenza vaccination declined 05/31/2017  . Class 2 severe obesity with serious comorbidity and body mass index (BMI) of 36.0 to 36.9 in adult [facility]) 06/05/2015  . Genital herpes 06/05/2015  . History of syphilis 06/05/2015  . Routine general medical examination at a health care facility 06/05/2015  . Family history of premature CAD 06/05/2015  . Mixed dyslipidemia 06/05/2015  . Impaired fasting blood sugar 06/05/2015  . HPV (human papilloma virus) infection   . Herpes   . Ovarian cyst     Past Surgical History:  Procedure Laterality Date  . BREAST REDUCTION SURGERY Bilateral 12/04/2015   Procedure: BILATERAL BREAST REDUCTION  WITH LIPOSUCTION;  Surgeon: Wallace Going, DO;  Location: [facility];  Service: Plastics;  Laterality: Bilateral;  . CESAREAN SECTION N/A 02/14/2018   Procedure: CESAREAN SECTION;  Surgeon: Crawford Givens, MD;  Location: [facility];  Service: Obstetrics;  Laterality: N/A;    OB History    Gravida  2   Para  0   Term  0   Preterm  0   AB  1   Living  0     SAB  1   TAB  0   Ectopic  0   Multiple  0   Live Births  Home Medications    Prior to Admission medications   Medication Sig Start Date End Date Taking? Authorizing Provider  tranexamic acid (LYSTEDA) 650 MG TABS tablet Take 1,300 mg by mouth 3 (three) times daily.   Yes [provider]    Family History Family History  Problem Relation Age of Onset  . Heart disease Father 57       died MI  . Hypertension Maternal Grandmother   . Diabetes Maternal Grandmother   . Stroke Maternal Grandmother   . Heart disease Maternal Grandmother   . Hypertension Maternal Grandfather   . COPD Maternal Grandfather   . GER disease Mother   . Deep vein thrombosis Mother        with MVA  . COPD Paternal Grandfather     Social History Social History   Tobacco  Use  . Smoking status: Never Smoker  . Smokeless tobacco: Never Used  Substance Use Topics  . Alcohol use: Never    Alcohol/week: 0.0 standard drinks    Frequency: Never    Comment: occasionally  . Drug use: No     Allergies   Meloxicam; Metformin and related; and Nuvaring [etonogestrel-ethinyl estradiol]   Review of Systems Review of Systems   Physical Exam Triage Vital Signs ED Triage Vitals  Enc Vitals Group     BP 05/30/18 1830 120/82     Pulse Rate 05/30/18 1830 76     Resp 05/30/18 1830 16     Temp 05/30/18 1830 98.1 F (36.7 C)     Temp Source 05/30/18 1830 Oral     SpO2 05/30/18 1830 100 %     Weight 05/30/18 1827 188 lb (85.3 kg)     Height --      Head Circumference --      Peak Flow --      Pain Score 05/30/18 1827 9     Pain Loc --      Pain Edu? --      Excl. in [place]? --    No data found.  Updated Vital Signs BP 120/82   Pulse 76   Temp 98.1 F (36.7 C) (Oral)   Resp 16   Wt 188 lb (85.3 kg)   LMP 05/24/2018 (Exact Date)   SpO2 100%   BMI 35.52 kg/m    Physical Exam  Constitutional: She is oriented to person, place, and time. She appears well-developed and well-nourished. No distress.  Tearful with initial exam   Cardiovascular: Normal rate, regular rhythm, normal heart sounds and normal pulses.  Pulmonary/Chest: Effort normal and breath sounds normal.  Abdominal: Soft. Bowel sounds are normal. There is tenderness in the epigastric area.  Reproducible epigastric tenderness on exam   Neurological: She is alert and oriented to person, place, and time.  Skin: Skin is warm. She is diaphoretic.   EKG NSR rate 82. Reviewed with supervising physician Dr. Joseph Art   UC Treatments / Results  Labs (all labs ordered are listed, but only abnormal results are displayed) Labs Reviewed - No data to display  EKG None  Radiology Dg Chest 2 View  Result Date: 05/30/2018 CLINICAL DATA:  32 y/o  F; chest pain. EXAM: CHEST - 2 VIEW COMPARISON:   12/03/2012 chest radiograph. FINDINGS: Stable borderline cardiomegaly. No consolidation, effusion, or pneumothorax. No acute osseous abnormality is evident. IMPRESSION: Stable borderline cardiomegaly. No acute pulmonary process identified. Electronically Signed   By: Kristine Garbe M.D.   On: 05/30/2018 19:28    Procedures Procedures (  including critical care time)  Medications Ordered in UC Medications  alum & mag hydroxide-simeth (MAALOX/MYLANTA) 200-200-20 MG/5ML suspension 30 mL (30 mLs Oral Given 05/30/18 1904)    And  lidocaine (XYLOCAINE) 2 % viscous mouth solution 15 mL (15 mLs Oral Given 05/30/18 1904)    Initial Impression / Assessment and Plan / UC Course  I have reviewed the triage vital signs and the nursing notes.  Pertinent labs & imaging results that were available during my care of the patient were reviewed by me and considered in my medical decision making (see chart for details).     Intermittent episodes of chest pain, not exertional. Recent pregnancy and c-section and has been taking tranazemic acid for her menstrual cycle. Symptoms resolve however. No tachypnea or tachycardia tonight. afebrile. No hypoxia. No history of blood clots or cardiac history. ekg reassuring at this time.  On reassess s/p approximately 1.5 hours in clinic and GI cocktail patient still quite uncomfortable, more nausea, and pain still 7/10. Cardiac vs pe vs spasm vs angina vs anxiety considered and discussed. Recommend further evaluation in ER at this time. Safe for self transport. Patient verbalized understanding and agreeable to plan.   Final Clinical Impressions(s) / UC Diagnoses   Final diagnoses:  Epigastric pain     Discharge Instructions     Please go to the ER for further evaluation of your epigastric pain as it is persistent with nausea and shortness of breath.    ED Prescriptions    None     Controlled Substance Prescriptions  Controlled Substance Registry  consulted? Not Applicable   Zigmund Gottron, NP 05/30/18 1940

## 2018-05-30 NOTE — ED Triage Notes (Signed)
Patient with recurrent chest pain.  She states that she has been nauseated with the pain, but no vomiting.  It started around 5pm today and has not had any relief.  She describes that pain as a tightness, with some associated diaphoresis.

## 2018-05-30 NOTE — Discharge Instructions (Signed)
Please go to the ER for further evaluation of your epigastric pain as it is persistent with nausea and shortness of breath.

## 2018-05-30 NOTE — ED Triage Notes (Signed)
Pt reports she was admitted 2 months ago for complications with a pregnancy and PT has been having intermittent chest pains ever since. Pt is no longer pregnant. PT reports this episode started today and is associated with diaphoresis and weakness. PT points to epigastric area when she describes pain.

## 2018-06-01 ENCOUNTER — Ambulatory Visit (INDEPENDENT_AMBULATORY_CARE_PROVIDER_SITE_OTHER): Payer: BLUE CROSS/BLUE SHIELD | Admitting: Medical

## 2018-06-01 VITALS — BP 120/74 | HR 72 | Temp 98.2°F | Resp 16 | Ht 61.0 in | Wt 191.0 lb

## 2018-06-01 DIAGNOSIS — R142 Eructation: Secondary | ICD-10-CM

## 2018-06-01 DIAGNOSIS — R42 Dizziness and giddiness: Secondary | ICD-10-CM

## 2018-06-01 DIAGNOSIS — R079 Chest pain, unspecified: Secondary | ICD-10-CM

## 2018-06-01 DIAGNOSIS — R9389 Abnormal findings on diagnostic imaging of other specified body structures: Secondary | ICD-10-CM

## 2018-06-01 MED ORDER — DEXLANSOPRAZOLE 60 MG PO CPDR: 60.0000 mg | DELAYED_RELEASE_CAPSULE | Freq: Every day | ORAL | 0 refills | Status: DC

## 2018-06-01 NOTE — Progress Notes (Signed)
Subjective: Chief Complaint  Patient presents with  . hospital Follow up    hospital follow up    Here for hospital f/u.   Had went to ED for chest pain, sweats, dizziness 2 days ago.  Had an initial triage labs chest x-ray and EKG but due to long wait ended up leaving.  She is continued to have mainly the chest pain symptoms.  She has generalized chest pain, can last for hours, she does have a lot of belching and burping.  But there was mention about an abnormal heart finding on x-ray.  She is taken over-the-counter Nexium a few times with some relief.  Just had a baby 3 months ago.  Denies anxiety in general.  Of note she was hospitalized for 2 months of her pregnancy due to a short cervix.  No other aggravating or relieving factors. No other complaint.   Past Medical History:  Diagnosis Date  . Bacterial infection   . Family history of premature CAD    father  . Fibroids   . Herpes    genital  . Impaired fasting blood sugar   . Miscarriage 2014  . Mixed dyslipidemia   . Ovarian cyst   . Sexually transmitted disease (STD)    hx/o HPV, gonorrhea, chlamydia, genital herpes  . Syphilis 2010  . Yeast infection    Current Outpatient Medications on File Prior to Visit  Medication Sig Dispense Refill  . tranexamic acid (LYSTEDA) 650 MG TABS tablet Take 1,300 mg by mouth 3 (three) times daily.     No current facility-administered medications on file prior to visit.    ROS as in subjective   Objective: BP 120/74   Pulse 72   Temp 98.2 F (36.8 C) (Oral)   Resp 16   Ht 5\' 1"  (1.549 m)   Wt 191 lb (86.6 kg)   LMP 05/24/2018 (Exact Date)   SpO2 98%   BMI 36.09 kg/m   Wt Readings from Last 3 Encounters:  06/01/18 191 lb (86.6 kg)  05/30/18 188 lb (85.3 kg)  05/30/18 188 lb (85.3 kg)     General appearance: alert, no distress, WD/WN, obese AA female Oral cavity: MMM, no lesions Neck: supple, no lymphadenopathy, no thyromegaly, no masses Heart: RRR, normal S1, S2, no  murmurs Lungs: CTA bilaterally, no wheezes, rhonchi, or rales Chest wall mildly tender over the sternum otherwise nontender chest wall and back Abdomen: +bs, soft, non tender, non distended, no masses, no hepatomegaly, no splenomegaly Pulses: 2+ symmetric, upper and lower extremities, normal cap refill Extremities without edema Calves nontender negative Homans     Assessment: Encounter Diagnoses  Name Primary?  . Chest pain, unspecified type Yes  . Dizziness   . Belching   . Abnormal chest x-ray      Plan: I reviewed the hospital report from 05/30/2018, reviewed labs that were done, EKG, chest x-ray.  All studies were within normal or nonsignificant except for chest x-ray shows borderline cardiomegaly.  Discussed differential of pain, doubt PE, currently I doubt cardiac origin, so we will treat for GERD.     We will start with samples of Dexilant, GERD trigger avoidance, avoid eating close to bedtime, use smaller portions, and recheck in 2 weeks  She does have a family history significant of premature coronary artery disease.  Depending on symptoms next visit we can either pursue ultrasound of the heart or if still symptomatic we may go ahead at that point and have her see cardiology  If worse in the meantime or not seeing any improvement at all then call back right away  [facility] was seen today for hospital follow up.  Diagnoses and all orders for this visit:  Chest pain, unspecified type  Dizziness  Belching  Abnormal chest x-ray  Other orders -     dexlansoprazole (DEXILANT) 60 MG capsule; Take 1 capsule (60 mg total) by mouth daily.

## 2018-10-11 ENCOUNTER — Encounter: Payer: Self-pay | Admitting: Medical

## 2018-10-11 ENCOUNTER — Ambulatory Visit: Payer: BLUE CROSS/BLUE SHIELD | Admitting: Medical

## 2018-10-11 VITALS — BP 110/70 | HR 97 | Temp 99.3°F | Resp 16 | Ht 61.0 in | Wt 204.4 lb

## 2018-10-11 DIAGNOSIS — R6889 Other general symptoms and signs: Secondary | ICD-10-CM | POA: Diagnosis not present

## 2018-10-11 DIAGNOSIS — R509 Fever, unspecified: Secondary | ICD-10-CM | POA: Diagnosis not present

## 2018-10-11 DIAGNOSIS — R05 Cough: Secondary | ICD-10-CM | POA: Diagnosis not present

## 2018-10-11 DIAGNOSIS — R059 Cough, unspecified: Secondary | ICD-10-CM

## 2018-10-11 LAB — POCT INFLUENZA A/B
Influenza A, POC: NEGATIVE
Influenza B, POC: NEGATIVE

## 2018-10-11 MED ORDER — OSELTAMIVIR PHOSPHATE 75 MG PO CAPS: 75.0000 mg | ORAL_CAPSULE | Freq: Two times a day (BID) | ORAL | 0 refills | Status: DC

## 2018-10-11 NOTE — Progress Notes (Signed)
  Subjective:     Patient ID: Laura Cross, female   DOB: June 27, 1986, 33 y.o.   MRN: [id number]  HPI Chief Complaint  Patient presents with  . Cough    cough X 2 month, chest congestion, achey, runny nose, chills X 1 day   Her for fever, chills, subjective fever, body aches, chills x 1 day.  Some dizziness this morning.   No nausea, vomiting, diarrhea, sore throat, ear pain, rash, no wheezing or SOB.   She notes maybe some tightness in chest, hx/o bronchitis.   No recent travel to [place], no recent known exposure to Coronavirus.  She has been around her sister who was in [place] a month ago.  Sister was not sick.     Coughing for 2 months, and this finally cleared up a week ago but then started coughing again yesterday.  [place]ker.   Past Medical History:  Diagnosis Date  . Bacterial infection   . Family history of premature CAD    father  . Fibroids   . Herpes    genital  . Impaired fasting blood sugar   . Miscarriage 2014  . Mixed dyslipidemia   . Ovarian cyst   . Sexually transmitted disease (STD)    hx/o HPV, gonorrhea, chlamydia, genital herpes  . Syphilis 2010  . Yeast infection    Current Outpatient Medications on File Prior to Visit  Medication Sig Dispense Refill  . dexlansoprazole (DEXILANT) 60 MG capsule Take 1 capsule (60 mg total) by mouth daily. (Patient not taking: Reported on 10/11/2018) 15 capsule 0  . tranexamic acid (LYSTEDA) 650 MG TABS tablet Take 1,300 mg by mouth 3 (three) times daily.     No current facility-administered medications on file prior to visit.     Review of Systems As in subjective     Objective:   Physical Exam BP 110/70   Pulse 97   Temp 99.3 F (37.4 C) (Oral)   Resp 16   Ht 5\' 1"  (1.549 m)   Wt 204 lb 6.4 oz (92.7 kg)   LMP 10/09/2018 (Exact Date)   SpO2 97%   BMI 38.62 kg/m   General: somewhat Ill-appearing, well-developed, well-nourished Skin: warm, dry HEENT: Nose inflamed and congested, clear conjunctiva,  TMs pearly, no sinus tenderness, pharynx with erythema, no exudates Neck: Supple, non tender, shotty cervical adenopathy Heart: Regular rate and rhythm, normal S1, S2, no murmurs Lungs: Clear to auscultation bilaterally, no wheezes, rales, rhonchi Extremities: Mild generalized tenderness       Assessment:     Encounter Diagnoses  Name Primary?  . Fever, unspecified fever cause Yes  . Flu-like symptoms   . Cough        Plan:     Symptoms and exam suggest influenza.   Lower risk for Coronavirus.     Prescription given for Tamiflu, discussed risks/benefits of medication.    Discussed diagnosis of influenza. Discussed supportive care including rest, hydration, OTC Tylenol or NSAID for fever, aches, and malaise.  Discussed period of contagion, self quarantine at home away from others to avoid spread of disease, discussed means of transmission, and possible complications including pneumonia.  If worse or not improving within the next 4-5 days, then call or return.  Patient voiced understanding of diagnosis, recommendations, and treatment plan.  After visit summary given.  Gave note for work.

## 2018-10-14 IMAGING — US US MFM OB TRANSVAGINAL
1 series · 15 of 25 positions shown · non-contrast
Comparison: none

[Series 1: us mfm ob transvaginal · 25 acquisitions, 15 frames shown]
[im 1/25]
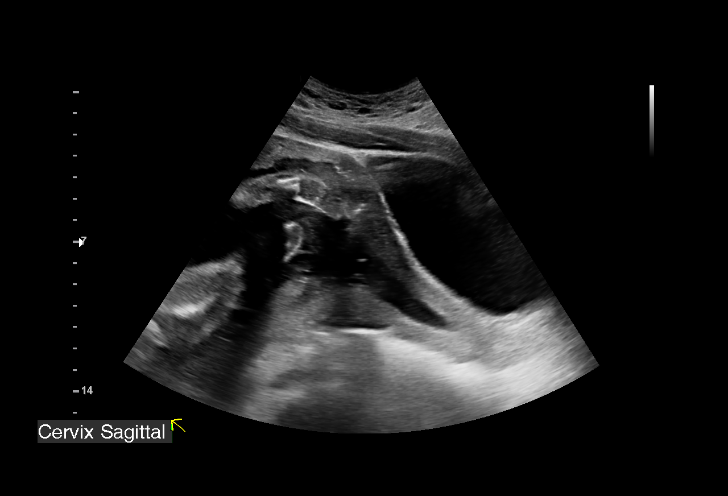
[im 3/25]
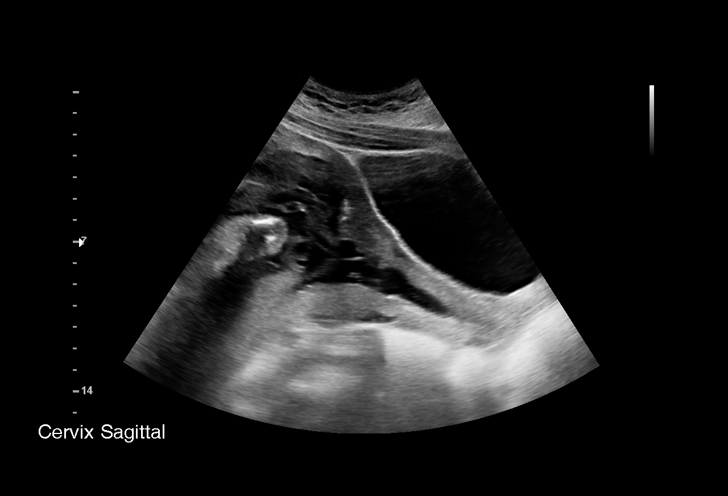
[im 5/25]
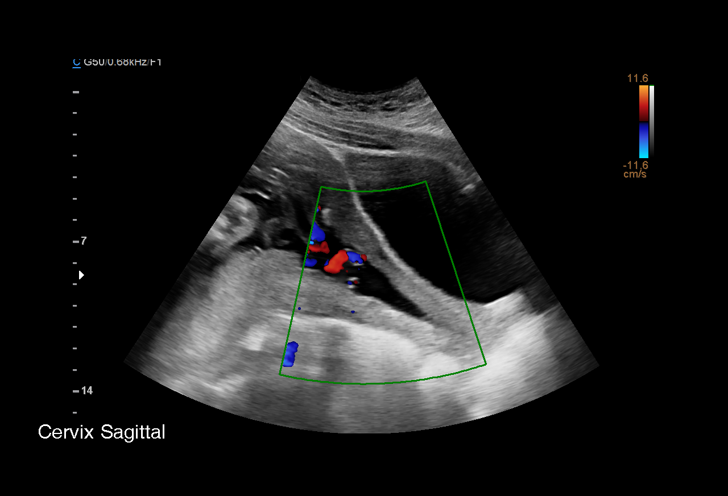
[im 6/25]
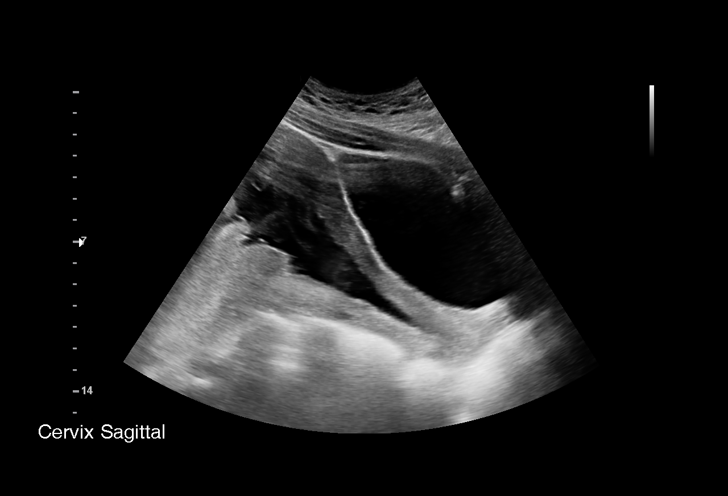
[im 8/25]
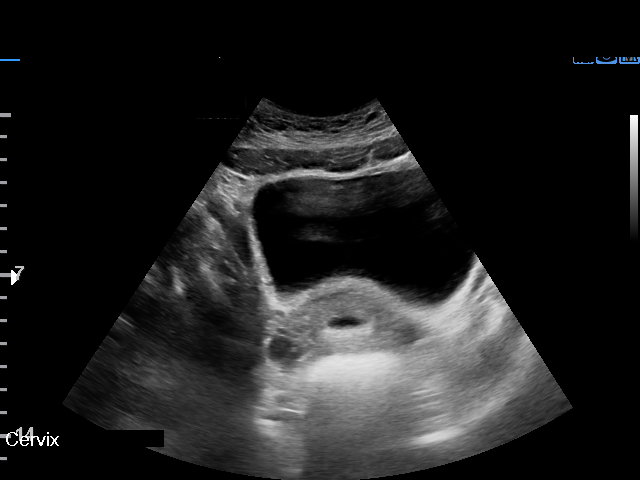
[im 10/25]
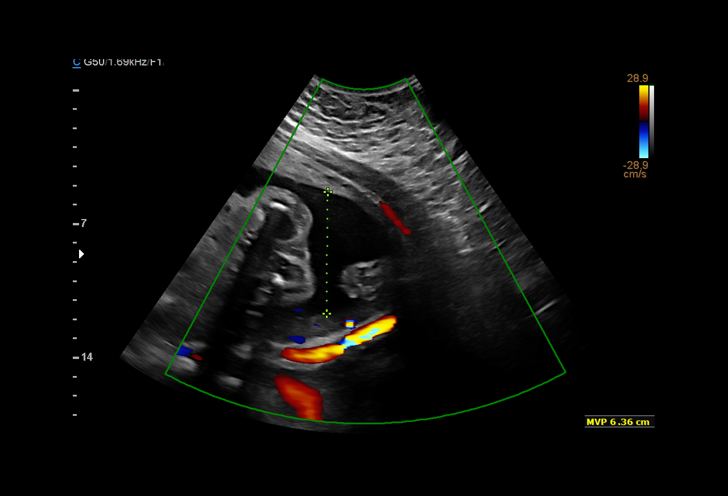
[im 11/25]
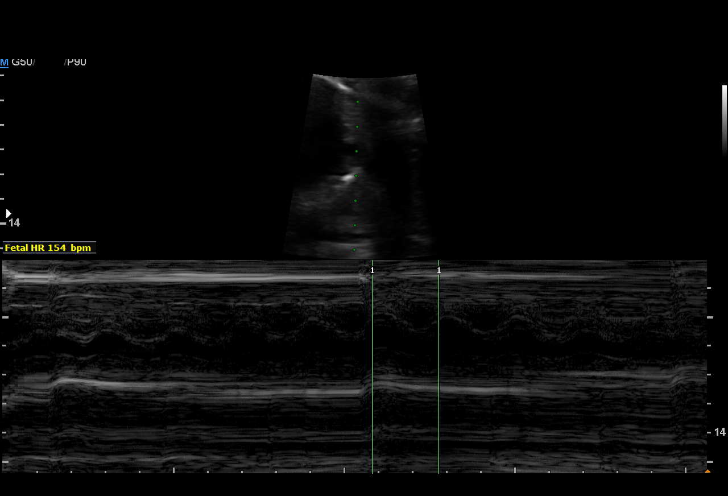
[im 13/25]
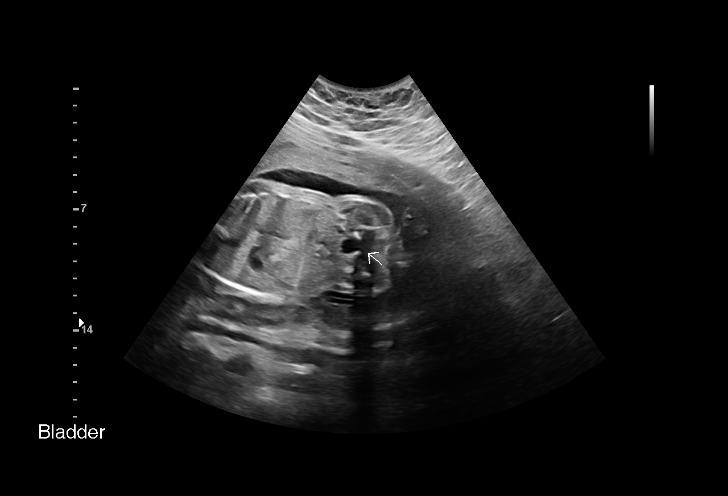
[im 15/25]
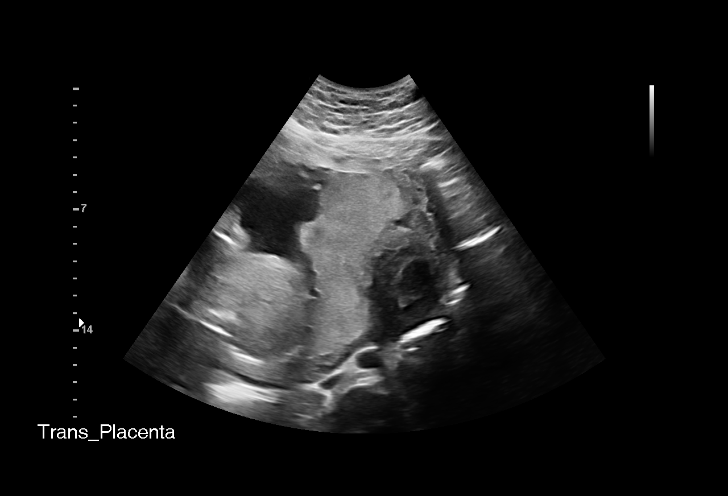
[im 16/25]
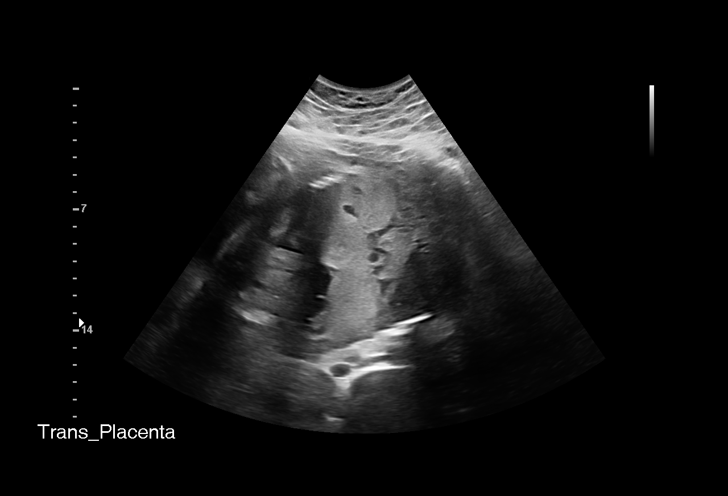
[im 18/25]
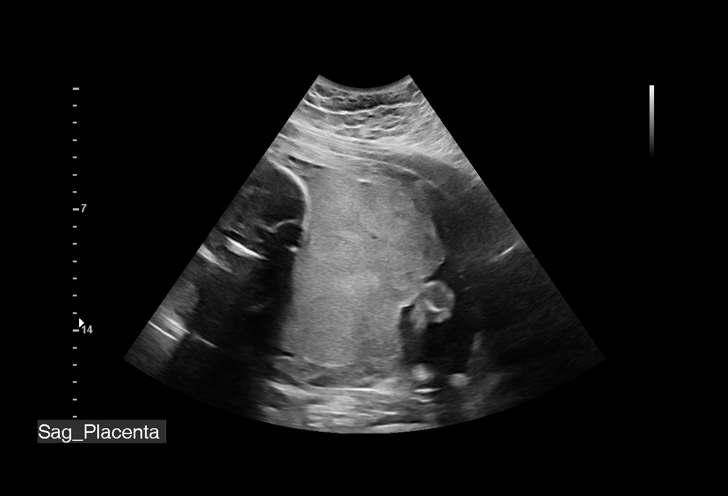
[im 20/25]
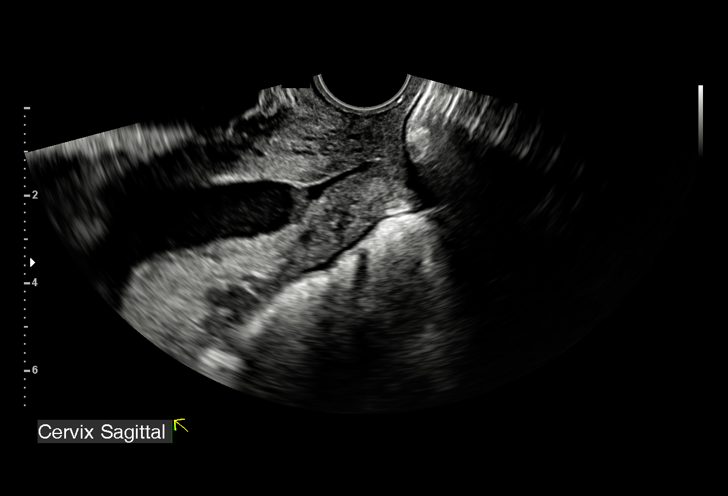
[im 21/25]
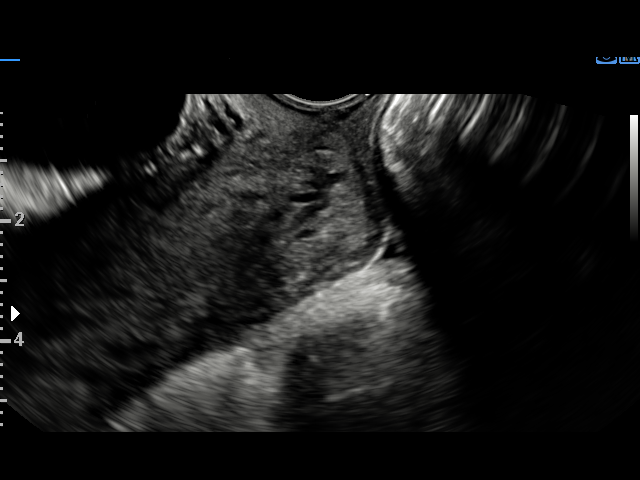
[im 23/25]
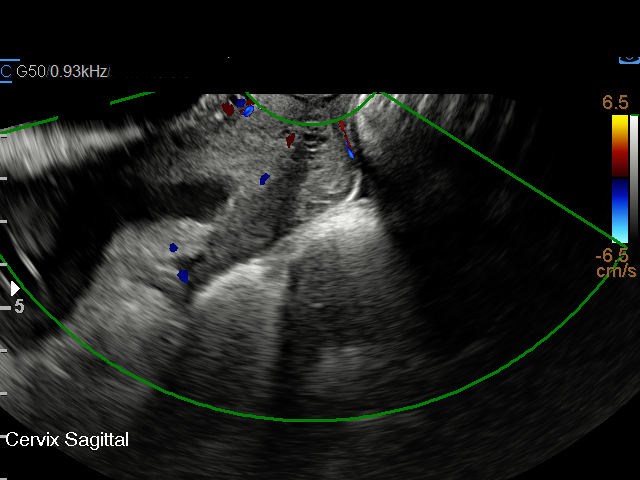
[im 25/25]
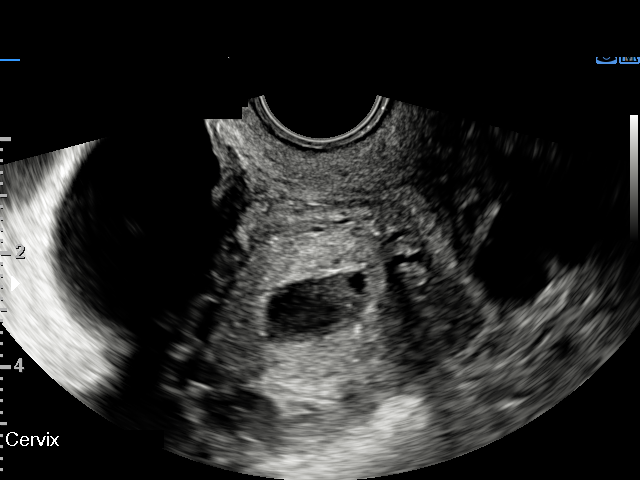

[15 of 25 positions shown; findings below may reference images not displayed]

#[id number]
Attending:        Ruel Rochez         Secondary Phy.:   3rd Nursing- HR
OB

1  DARLY JEFFERIES           [id number]      [id number]     [id number]
Indications

26 weeks gestation of pregnancy
Obesity complicating pregnancy, second
trimester
Uterine fibroids affecting pregnancy in        O34.12,
second trimester, antepartum
Medical complication of pregnancy (PCOS -
metformin)
Encounter for cervical length
Cervical shortening, second trimester;
vaginal progesterone
OB History

Blood Type:            Height:  5'1"   Weight (lb):  198       BMI:
Gravidity:    2         Term:   0        Prem:   0        SAB:   1
TOP:          0       Ectopic:  0        Living: 0
Fetal Evaluation

Num Of Fetuses:     1
Fetal Heart         154
Rate(bpm):
Cardiac Activity:   Observed
Presentation:       Breech, footling
Placenta:           Posterior, above cervical os

Amniotic Fluid
AFI FV:      Subjectively within normal limits

Largest Pocket(cm)
6.36
Gestational Age

LMP:           26w 1d        Date:  06/30/17                 EDD:   04/06/18
Best:          26w 1d     Det. By:  LMP  (06/30/17)          EDD:   04/06/18
Anatomy

Stomach:               Appears normal, left   Bladder:                Appears normal
sided
Cervix Uterus Adnexa

Cervix
Funneling of internal os noted.  No measurable cervix.
Impression

Intrauterine pregnancy at weeks 26+1 with fibroids and short
cervix  here for transvaginal evaluation
Normal fetal cardiac activity
Transvaginal cervical length is difficult to assess. there is a
larrge "U" shaped funnel with a thin "needle" of echolucent
space at the end. This [DATE]mm of closed cervix
with mucus or early funnelling
Recommendations

Must rely on clinical correlation. Recommend repeat
evaluation in 3-4 days

## 2018-10-24 ENCOUNTER — Telehealth: Payer: Self-pay

## 2018-10-24 ENCOUNTER — Other Ambulatory Visit: Payer: Self-pay | Admitting: Medical

## 2018-10-24 MED ORDER — BENZONATATE 200 MG PO CAPS: 200.0000 mg | ORAL_CAPSULE | Freq: Three times a day (TID) | ORAL | 0 refills | Status: DC | PRN

## 2018-10-24 NOTE — Telephone Encounter (Signed)
I sent Tessalon Perles for cough to pharmacy

## 2018-10-24 NOTE — Telephone Encounter (Signed)
Patient informed. 

## 2018-10-24 NOTE — Telephone Encounter (Signed)
Patient called stating she was seen recently and treated for flu and cough. Flu symptoms has improved but she still has a cough and wants to know if something else can be sent to the pharmacy for her. Please advise.

## 2018-12-28 LAB — RESULTS CONSOLE HPV: CHL HPV: NEGATIVE

## 2019-05-05 ENCOUNTER — Other Ambulatory Visit: Payer: Self-pay

## 2019-05-05 ENCOUNTER — Encounter: Payer: Self-pay | Admitting: Medical

## 2019-05-05 ENCOUNTER — Ambulatory Visit: Payer: BC Managed Care – PPO | Admitting: Medical

## 2019-05-05 VITALS — BP 110/80 | HR 81 | Temp 97.7°F | Ht 61.0 in | Wt 209.4 lb

## 2019-05-05 DIAGNOSIS — Z0000 Encounter for general adult medical examination without abnormal findings: Secondary | ICD-10-CM | POA: Diagnosis not present

## 2019-05-05 DIAGNOSIS — N920 Excessive and frequent menstruation with regular cycle: Secondary | ICD-10-CM | POA: Insufficient documentation

## 2019-05-05 DIAGNOSIS — D259 Leiomyoma of uterus, unspecified: Secondary | ICD-10-CM

## 2019-05-05 DIAGNOSIS — Z7189 Other specified counseling: Secondary | ICD-10-CM | POA: Diagnosis not present

## 2019-05-05 DIAGNOSIS — E669 Obesity, unspecified: Secondary | ICD-10-CM | POA: Diagnosis not present

## 2019-05-05 DIAGNOSIS — Z113 Encounter for screening for infections with a predominantly sexual mode of transmission: Secondary | ICD-10-CM | POA: Diagnosis not present

## 2019-05-05 DIAGNOSIS — Z7185 Encounter for immunization safety counseling: Secondary | ICD-10-CM | POA: Insufficient documentation

## 2019-05-05 DIAGNOSIS — Z2821 Immunization not carried out because of patient refusal: Secondary | ICD-10-CM

## 2019-05-05 NOTE — Progress Notes (Signed)
Subjective:   HPI  Laura Cross is a 33 y.o. female who presents for Chief Complaint  Patient presents with  . Annual Exam    Patient Care Team: Tysinger, Camelia Eng, PA-C as PCP - General (Family Medicine) Sees dentist Sees eye doctor  Concerns: She is the heaviest she has been frustrated with her weight.  Wants help her weight loss.  She did take Qsymia in the past which helped.  She is considering weight loss surgery consult.  No prior sleep study.  She notes occasionally feeling dizzy but no chest pain no fall no syncope, no shortness of breath, no paresthesias.  She declines flu shot   Past Medical History:  Diagnosis Date  . Family history of premature CAD    father  . Fibroids   . Impaired fasting blood sugar   . Miscarriage 2014  . Mixed dyslipidemia   . Ovarian cyst   . Sexually transmitted disease (STD)    hx/o HPV, gonorrhea, chlamydia, genital herpes  . Syphilis 2010    Past Surgical History:  Procedure Laterality Date  . BREAST REDUCTION SURGERY Bilateral 12/04/2015   Procedure: BILATERAL BREAST REDUCTION  WITH LIPOSUCTION;  Surgeon: Wallace Going, DO;  Location: [place];  Service: Plastics;  Laterality: Bilateral;  . CESAREAN SECTION N/A 02/14/2018   Procedure: CESAREAN SECTION;  Surgeon: Crawford Givens, MD;  Location: [place];  Service: Obstetrics;  Laterality: N/A;    Social History   Socioeconomic History  . Marital status: Single    Spouse name: Not on file  . Number of children: Not on file  . Years of education: Not on file  . Highest education level: Not on file  Occupational History  . Not on file  Social Needs  . Financial resource strain: Not on file  . Food insecurity    Worry: Not on file    Inability: Not on file  . Transportation needs    Medical: Not on file    Non-medical: Not on file  Tobacco Use  . Smoking status: Never Smoker  . Smokeless tobacco: Never Used  Substance and Sexual  Activity  . Alcohol use: Yes    Alcohol/week: 0.0 standard drinks    Frequency: Never    Comment: occasionally  . Drug use: No  . Sexual activity: Not Currently    Birth control/protection: None  Lifestyle  . Physical activity    Days per week: Not on file    Minutes per session: Not on file  . Stress: Not on file  Relationships  . Social Herbalist on phone: Not on file    Gets together: Not on file    Attends religious service: Not on file    Active member of club or organization: Not on file    Attends meetings of clubs or organizations: Not on file    Relationship status: Not on file  . Intimate partner violence    Fear of current or ex partner: Not on file    Emotionally abused: Not on file    Physically abused: Not on file    Forced sexual activity: Not on file  Other Topics Concern  . Not on file  Social History Narrative   Lives alone, but has her 28 yo daughter 3 days per week custody.   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   Exercise  - some with walking. No significant other.  04/2019    Family History  Problem Relation Age of Onset  . Heart disease Father 68       died MI  . Hypertension Maternal Grandmother   . Diabetes Maternal Grandmother   . Stroke Maternal Grandmother   . Heart disease Maternal Grandmother   . Hypertension Maternal Grandfather   . COPD Maternal Grandfather   . GER disease Mother   . Deep vein thrombosis Mother        with MVA  . COPD Paternal Grandfather   . Pneumonia Brother      Current Outpatient Medications:  .  benzonatate (TESSALON) 200 MG capsule, Take 1 capsule (200 mg total) by mouth 3 (three) times daily as needed for cough., Disp: 20 capsule, Rfl: 0 .  oseltamivir (TAMIFLU) 75 MG capsule, Take 1 capsule (75 mg total) by mouth 2 (two) times daily., Disp: 10 capsule, Rfl: 0  Allergies  Allergen Reactions  . Meloxicam Other (See Comments)    Bleeding?  Can tolerate other NSAIDs  . Metformin And Related  Diarrhea and Other (See Comments)    hair falling out  . Nuvaring [Etonogestrel-Ethinyl Estradiol] Other (See Comments)    Flu-like symptoms     Reviewed their medical, surgical, family, social, medication, and allergy history and updated chart as appropriate.   Review of Systems Constitutional: -fever, -chills, -sweats, -unexpected weight change, -decreased appetite, +fatigue Allergy: -sneezing, -itching, -congestion Dermatology: -changing moles, --rash, -lumps ENT: -runny nose, -ear pain, -sore throat, -hoarseness, -sinus pain, -teeth pain, - ringing in ears, -hearing loss, -nosebleeds Cardiology: -chest pain, -palpitations, -swelling, -difficulty breathing when lying flat, -waking up short of breath Respiratory: -cough, -shortness of breath, -difficulty breathing with exercise or exertion, -wheezing, -coughing up blood Gastroenterology: -abdominal pain, -nausea, -vomiting, -diarrhea, -constipation, -blood in stool, -changes in bowel movement, -difficulty swallowing or eating Hematology: -bleeding, -bruising  Musculoskeletal: -joint aches, -muscle aches, -joint swelling, -back pain, -neck pain, -cramping, -changes in gait Ophthalmology: denies vision changes, eye redness, itching, discharge Urology: -burning with urination, -difficulty urinating, -blood in urine, -urinary frequency, -urgency, -incontinence Neurology: -headache, -weakness, -tingling, -numbness, -memory loss, -falls, +dizziness Psychology: -depressed mood, -agitation, -sleep problems Breast/gyn: -breast tenderness, -discharge, -lumps, -vaginal discharge,- irregular periods, -heavy periods      Objective:  BP 110/80   Pulse 81   Temp 97.7 F (36.5 C)   Ht 5\' 1"  (1.549 m)   Wt 209 lb 6.4 oz (95 kg)   SpO2 98%   BMI 39.57 kg/m   General appearance: alert, no distress, WD/WN, African American female Skin: unremarkable HEENT: normocephalic, conjunctiva/corneas normal, sclerae anicteric, PERRLA, EOMi, nares patent,  no discharge or erythema, pharynx normal Oral cavity: MMM, tongue normal, teeth normal Neck: supple, no lymphadenopathy, no thyromegaly, no masses, normal ROM, no bruits Chest: non tender, normal shape and expansion Heart: RRR, normal S1, S2, no murmurs Lungs: CTA bilaterally, no wheezes, rhonchi, or rales Abdomen: +bs, soft, non tender, non distended, no masses, no hepatomegaly, no splenomegaly, no bruits Back: non tender, normal ROM, no scoliosis Musculoskeletal: upper extremities non tender, no obvious deformity, normal ROM throughout, lower extremities non tender, no obvious deformity, normal ROM throughout Extremities: no edema, no cyanosis, no clubbing Pulses: 2+ symmetric, upper and lower extremities, normal cap refill Neurological: alert, oriented x 3, CN2-12 intact, strength normal upper extremities and lower extremities, sensation normal throughout, DTRs 2+ throughout, no cerebellar signs, gait normal Psychiatric: normal affect, behavior normal, pleasant  Breast/gyn/rectal - deferred to gynecology    Assessment and Plan :   Encounter Diagnoses  Name  Primary?  . Encounter for health maintenance examination in adult Yes  . Obesity, unspecified classification, unspecified obesity type, unspecified whether serious comorbidity present   . Screen for STD (sexually transmitted disease)   . Vaccine counseling   . Influenza vaccination declined   . Uterine leiomyoma, unspecified location   . Menorrhagia with regular cycle     Physical exam - discussed and counseled on healthy lifestyle, diet, exercise, preventative care, vaccinations, sick and well care, proper use of emergency dept and after hours care, and addressed their concerns.    Health screening: Advised they see their eye doctor yearly for routine vision care. Advised they see their dentist yearly for routine dental care including hygiene visits twice yearly.  Discussed STD testing, discussed prevention, condom use,  means of transmission  Cancer screening Counseled on self breast exams, mammograms, cervical cancer screening  She will return on another visit for breast/pelvic exam, declines today. Due for pap.   Vaccinations: Advised yearly influenza vaccine Patient declines influenza vaccine  Up to date on tetanus booster   Separate significant chronic issues discussed: counseled on diet, exercise, strategies to lose weight.  F/u pending labs  [facility] was seen today for annual exam.  Diagnoses and all orders for this visit:  Encounter for health maintenance examination in adult -     Comprehensive metabolic panel -     CBC with Differential/Platelet -     Lipid panel -     HIV Antibody (routine testing w rflx) -     RPR -     TSH -     GC/Chlamydia Probe Amp  Obesity, unspecified classification, unspecified obesity type, unspecified whether serious comorbidity present  Screen for STD (sexually transmitted disease) -     HIV Antibody (routine testing w rflx) -     RPR -     GC/Chlamydia Probe Amp  Vaccine counseling  Influenza vaccination declined  Uterine leiomyoma, unspecified location  Menorrhagia with regular cycle   Follow-up pending labs, yearly for physical

## 2019-05-06 LAB — CBC WITH DIFFERENTIAL/PLATELET
Basophils Absolute: 0 10*3/uL (ref 0.0–0.2)
Basos: 1 %
EOS (ABSOLUTE): 0.1 10*3/uL (ref 0.0–0.4)
Eos: 2 %
Hematocrit: 35.6 % (ref 34.0–46.6)
Hemoglobin: 11.6 g/dL (ref 11.1–15.9)
Immature Grans (Abs): 0 10*3/uL (ref 0.0–0.1)
Immature Granulocytes: 0 %
Lymphocytes Absolute: 1.6 10*3/uL (ref 0.7–3.1)
Lymphs: 40 %
MCH: 25.9 pg — ABNORMAL LOW (ref 26.6–33.0)
MCHC: 32.6 g/dL (ref 31.5–35.7)
MCV: 80 fL (ref 79–97)
Monocytes Absolute: 0.4 10*3/uL (ref 0.1–0.9)
Monocytes: 10 %
Neutrophils Absolute: 1.9 10*3/uL (ref 1.4–7.0)
Neutrophils: 47 %
Platelets: 307 10*3/uL (ref 150–450)
RBC: 4.48 x10E6/uL (ref 3.77–5.28)
RDW: 15.3 % (ref 11.7–15.4)
WBC: 4.1 10*3/uL (ref 3.4–10.8)

## 2019-05-06 LAB — COMPREHENSIVE METABOLIC PANEL
ALT: 41 IU/L — ABNORMAL HIGH (ref 0–32)
AST: 24 IU/L (ref 0–40)
Albumin/Globulin Ratio: 1.5 (ref 1.2–2.2)
Albumin: 4.3 g/dL (ref 3.8–4.8)
Alkaline Phosphatase: 84 IU/L (ref 39–117)
BUN/Creatinine Ratio: 13 (ref 9–23)
BUN: 11 mg/dL (ref 6–20)
Bilirubin Total: <0.2 mg/dL (ref 0.0–1.2)
CO2: 21 mmol/L (ref 20–29)
Calcium: 9.5 mg/dL (ref 8.7–10.2)
Chloride: 102 mmol/L (ref 96–106)
Creatinine, Ser: 0.82 mg/dL (ref 0.57–1.00)
GFR calc Af Amer: 109 mL/min/{1.73_m2} (ref >59)
GFR calc non Af Amer: 95 mL/min/{1.73_m2} (ref >59)
Globulin, Total: 2.8 g/dL (ref 1.5–4.5)
Glucose: 96 mg/dL (ref 65–99)
Potassium: 4 mmol/L (ref 3.5–5.2)
Sodium: 137 mmol/L (ref 134–144)
Total Protein: 7.1 g/dL (ref 6.0–8.5)

## 2019-05-06 LAB — LIPID PANEL
Chol/HDL Ratio: 7.5 ratio — ABNORMAL HIGH (ref 0.0–4.4)
Cholesterol, Total: 277 mg/dL — ABNORMAL HIGH (ref 100–199)
HDL: 37 mg/dL — ABNORMAL LOW (ref >39)
LDL Chol Calc (NIH): 193 mg/dL — ABNORMAL HIGH (ref 0–99)
Triglycerides: 242 mg/dL — ABNORMAL HIGH (ref 0–149)
VLDL Cholesterol Cal: 47 mg/dL — ABNORMAL HIGH (ref 5–40)

## 2019-05-06 LAB — RPR: RPR Ser Ql: NONREACTIVE

## 2019-05-06 LAB — HIV ANTIBODY (ROUTINE TESTING W REFLEX): HIV Screen 4th Generation wRfx: NONREACTIVE

## 2019-05-06 LAB — TSH: TSH: 1.72 u[IU]/mL (ref 0.450–4.500)

## 2019-05-09 LAB — GC/CHLAMYDIA PROBE AMP
Chlamydia trachomatis, NAA: NEGATIVE
Neisseria Gonorrhoeae by PCR: NEGATIVE

## 2019-05-10 ENCOUNTER — Telehealth: Payer: Self-pay | Admitting: Medical

## 2019-05-10 NOTE — Telephone Encounter (Signed)
Please call pt, she would like to speak with you about your concerns/questions on her labs

## 2019-05-12 ENCOUNTER — Other Ambulatory Visit: Payer: Self-pay | Admitting: Medical

## 2019-05-12 LAB — SPECIMEN STATUS REPORT

## 2019-05-12 MED ORDER — QSYMIA 7.5-46 MG PO CP24: 1.0000 | ORAL_CAPSULE | ORAL | 1 refills | Status: DC

## 2019-05-12 NOTE — Telephone Encounter (Signed)
Left message for her to call back

## 2019-05-12 NOTE — Telephone Encounter (Signed)
Genera See if Laura Cross can add on acute hepatitis panel given the elevated liver test.   Of note her mother has a history of hepatitis that we want to screen again.  Her last hepatitis test was 7 years ago.  I spoke to patient about her labs, recommendations, changing to healthier diet and regular exercise.  She has reached out to a Physiological scientist, she has already increased to 5 bottles of water a day, and is starting to make changes and wants to go back on Qsymia which she has taken in the past to help with weight loss efforts.  She will follow-up in 6 weeks

## 2019-05-18 ENCOUNTER — Telehealth: Payer: Self-pay | Admitting: Medical

## 2019-05-18 NOTE — Telephone Encounter (Signed)
Pt called and states her Qsymia is requiring a P.A. and she has [employer]

## 2019-05-20 NOTE — Telephone Encounter (Signed)
P.A. QSYMIA 

## 2019-05-21 NOTE — Telephone Encounter (Signed)
P.A. denied pt must try and fail preferred Contrave.  Can pt be switched ?

## 2019-05-22 NOTE — Telephone Encounter (Signed)
Let her know and see if willing to try a different regimen due to insurance forcing our hand.    If so we can send message back explaining pros, cons and proper uses of medication

## 2019-05-23 NOTE — Telephone Encounter (Signed)
Pt is ok trying Contrave, can you switch?

## 2019-05-25 ENCOUNTER — Other Ambulatory Visit: Payer: Self-pay | Admitting: Medical

## 2019-05-25 MED ORDER — NALTREXONE-BUPROPION HCL ER 8-90 MG PO TB12: 2.0000 | ORAL_TABLET | Freq: Two times a day (BID) | ORAL | 1 refills | Status: DC

## 2019-05-25 NOTE — Telephone Encounter (Signed)
I sent Contrave to the pharmacy.  Have her begin 1 tablet once daily for 1 week, then 1 tablet twice daily for 1 week.  Have her call me back by the end of the second week before we go up on the dose.  Sometimes a medicine like this can cause some nausea in the short-term.  If any side effects let me know right away.

## 2019-05-27 NOTE — Telephone Encounter (Signed)
P.A. approved til 11/24/19.  Pt informed and given  instructions

## 2019-06-02 ENCOUNTER — Other Ambulatory Visit (HOSPITAL_COMMUNITY)
Admission: RE | Admit: 2019-06-02 | Discharge: 2019-06-02 | Disposition: A | Discharge status: Home / Self Care | Payer: BC Managed Care – PPO | Source: Ambulatory Visit | Attending: Medical | Admitting: Medical

## 2019-06-02 ENCOUNTER — Other Ambulatory Visit: Payer: Self-pay

## 2019-06-02 ENCOUNTER — Ambulatory Visit (INDEPENDENT_AMBULATORY_CARE_PROVIDER_SITE_OTHER): Payer: BC Managed Care – PPO | Admitting: Medical

## 2019-06-02 VITALS — BP 116/80 | HR 77 | Temp 97.8°F | Ht 61.0 in | Wt 202.8 lb

## 2019-06-02 DIAGNOSIS — E66812 Obesity, class 2: Secondary | ICD-10-CM

## 2019-06-02 DIAGNOSIS — Z124 Encounter for screening for malignant neoplasm of cervix: Secondary | ICD-10-CM | POA: Insufficient documentation

## 2019-06-02 DIAGNOSIS — D259 Leiomyoma of uterus, unspecified: Secondary | ICD-10-CM

## 2019-06-02 DIAGNOSIS — R7989 Other specified abnormal findings of blood chemistry: Secondary | ICD-10-CM | POA: Diagnosis not present

## 2019-06-02 DIAGNOSIS — Z6835 Body mass index (BMI) 35.0-35.9, adult: Secondary | ICD-10-CM

## 2019-06-02 LAB — HM PAP SMEAR: HM Pap smear: NEGATIVE

## 2019-06-02 LAB — RESULTS CONSOLE HPV: CHL HPV: NEGATIVE

## 2019-06-02 NOTE — Progress Notes (Signed)
Subjective: Chief Complaint  Patient presents with  . Gynecologic Exam   Here for Pap smear.  She came in recently for a physical.  She wanted to return for pelvic exam a separate date.  She has history of 2 pregnancies, 1 live birth, 1 miscarriage.  She has no current pelvic or breast concerns.  She is doing self breast exams.  She has no desire for contraception at this time.  Not sexually active of late.  She notes history of uterine fibroids.  Periods are regular and not terribly heavy at this time.  She is a non-smoker.   Objective: BP 116/80   Pulse 77   Temp 97.8 F (36.6 C)   Ht 5\' 1"  (1.549 m)   Wt 202 lb 12.8 oz (92 kg)   SpO2 97%   BMI 38.32 kg/m   Gen: wd, wn, nad  Breast: nontender, no masses or lumps, no skin changes, no nipple discharge or inversion, no axillary lymphadenopathy, surgical scars inferior breasts and around areola from prior breast reduction surgery Gyn: Normal external genitalia without lesions, vagina with normal mucosa, cervix without lesions, no cervical motion tenderness, no abnormal vaginal discharge.  Uterus and adnexa not enlarged, nontender, no masses.  Pap performed.  Exam chaperoned by nurse. Rectal: Anus normal appearing    Assessment: Encounter Diagnoses  Name Primary?  . Uterine leiomyoma, unspecified location   . Screening for cervical cancer Yes  . Class 2 severe obesity with serious comorbidity and body mass index (BMI) of 35.0 to 35.9 in adult, unspecified obesity type ([place])   . Elevated liver function tests     Plan: Pap smear today. She declines contraception, counseling states that is.  I reviewed the recent STD screen we did on her physical Obesity, elevated liver test-continue efforts to healthy diet and weight loss, continue Contrave for weight loss, return in about 6 to 8 weeks fasting for recheck including hepatitis panel, repeat liver test    [facility] was seen today for gynecologic exam.  Diagnoses and all  orders for this visit:  Screening for cervical cancer -     Cytology - PAP([place])  Uterine leiomyoma, unspecified location  Class 2 severe obesity with serious comorbidity and body mass index (BMI) of 35.0 to 35.9 in adult, unspecified obesity type ([place])  Elevated liver function tests

## 2019-06-06 LAB — CYTOLOGY - PAP
Adequacy: ABSENT
Comment: NEGATIVE
Diagnosis: NEGATIVE
High risk HPV: NEGATIVE

## 2019-06-24 NOTE — Telephone Encounter (Signed)
done

## 2019-08-18 ENCOUNTER — Other Ambulatory Visit: Payer: Self-pay

## 2019-08-18 ENCOUNTER — Ambulatory Visit: Payer: BC Managed Care – PPO | Admitting: Medical

## 2019-08-18 ENCOUNTER — Encounter: Payer: Self-pay | Admitting: Medical

## 2019-08-18 VITALS — BP 120/78 | HR 72 | Temp 98.6°F | Ht 60.0 in | Wt 203.6 lb

## 2019-08-18 DIAGNOSIS — R7301 Impaired fasting glucose: Secondary | ICD-10-CM | POA: Diagnosis not present

## 2019-08-18 DIAGNOSIS — L83 Acanthosis nigricans: Secondary | ICD-10-CM

## 2019-08-18 DIAGNOSIS — E782 Mixed hyperlipidemia: Secondary | ICD-10-CM | POA: Diagnosis not present

## 2019-08-18 DIAGNOSIS — R7989 Other specified abnormal findings of blood chemistry: Secondary | ICD-10-CM

## 2019-08-18 DIAGNOSIS — Z6835 Body mass index (BMI) 35.0-35.9, adult: Secondary | ICD-10-CM

## 2019-08-18 NOTE — Progress Notes (Signed)
Subjective:  Laura Cross is a 34 y.o. female who presents for Chief Complaint  Patient presents with  . Weight Loss     Here for follow-up from her recent physical in September 2020.  She is strongly considering bariatric surgery.  She notes for many years she has struggled with obesity.  She has tried numerous efforts to lose weight over the years.  She has tried numerous medications including phentermine, Qsymia, recently tried Contrave without success and had side effects from this.  She has had gym membership's and tried various workout routines.  She has tried working out with friends, has worked out with trainers.  She had one-on-one fitness training for about 6 months in the past.  She worked out with group sessions for months.  She would have successes for periods of time but then regained it back.  She has not seen a weight management clinic.  For the last few years she was stable but in this past year she gained more weight.  Now she is starting to see discoloration on her neck consistent with acanthosis nigricans.  She has high cholesterol now.  She has a 37-year-old daughter and wants to get this under better control.  Her insurance will cover weight loss surgery.  She is currently going to [employer].  She has lost a little bit of weight since last visit.  Her recent liver test was elevated on her labs at her physical..  No other aggravating or relieving factors. No other complaint.   Past Medical History:  Diagnosis Date  . Family history of premature CAD    father  . Fibroids   . Impaired fasting blood sugar   . Miscarriage 2014  . Mixed dyslipidemia   . Ovarian cyst   . Sexually transmitted disease (STD)    hx/o HPV, gonorrhea, chlamydia, genital herpes  . Syphilis 2010   Current Outpatient Medications on File Prior to Visit  Medication Sig Dispense Refill  . Naltrexone-buPROPion HCl ER 8-90 MG TB12 Take 2 tablets by mouth 2 (two) times daily with a meal. (Patient not  taking: Reported on 08/18/2019) 120 tablet 1   No current facility-administered medications on file prior to visit.    The following portions of the patient's history were reviewed and updated as appropriate: allergies, current medications, past family history, past medical history, past social history, past surgical history and problem list.  ROS Otherwise as in subjective above  Objective: BP 120/78   Pulse 72   Temp 98.6 F (37 C)   Ht 5' (1.524 m)   Wt 203 lb 9.6 oz (92.4 kg)   SpO2 98%   BMI 39.76 kg/m   General appearance: alert, no distress, well developed, well nourished   Assessment: Encounter Diagnoses  Name Primary?  . Mixed dyslipidemia Yes  . Class 2 severe obesity with serious comorbidity and body mass index (BMI) of 35.0 to 35.9 in adult, unspecified obesity type ([place])   . Impaired fasting blood sugar   . Elevated liver function tests   . Acanthosis nigricans      Plan: I will complete her paperwork for upcoming consult with bariatric clinic in February 2021. We discussed goalsetting, discussed efforts to lose weight including low-carb diet such as [employer], regular exercise at least 10 to 50 minutes/week, using strategies such as increasing steps per week, discussed meal planning, grocery shopping list, discussed working out with a friend, discussed reasonable goalsetting, increasing water intake.  We discussed the  relative pros and cons of bariatric surgery versus other strategies.     [facility] was seen today for weight loss.  Diagnoses and all orders for this visit:  Mixed dyslipidemia  Class 2 severe obesity with serious comorbidity and body mass index (BMI) of 35.0 to 35.9 in adult, unspecified obesity type ([facility])  Impaired fasting blood sugar  Elevated liver function tests  Acanthosis nigricans   Follow up: prn

## 2019-08-22 ENCOUNTER — Encounter: Payer: Self-pay | Admitting: Medical

## 2019-08-22 ENCOUNTER — Telehealth: Payer: Self-pay | Admitting: Medical

## 2019-08-22 NOTE — Telephone Encounter (Signed)
Fax letter to bariatric clinic at [facility] surgery for her upcoming 09/2019 appt

## 2019-08-22 NOTE — Telephone Encounter (Signed)
Note has been faxed.

## 2019-09-22 DIAGNOSIS — E282 Polycystic ovarian syndrome: Secondary | ICD-10-CM | POA: Diagnosis not present

## 2019-09-22 DIAGNOSIS — E7800 Pure hypercholesterolemia, unspecified: Secondary | ICD-10-CM | POA: Diagnosis not present

## 2019-09-22 DIAGNOSIS — R7303 Prediabetes: Secondary | ICD-10-CM | POA: Diagnosis not present

## 2019-09-25 ENCOUNTER — Other Ambulatory Visit: Payer: Self-pay | Admitting: General Surgery

## 2019-09-29 ENCOUNTER — Ambulatory Visit: Payer: BC Managed Care – PPO | Admitting: Medical

## 2019-10-02 ENCOUNTER — Ambulatory Visit: Payer: BC Managed Care – PPO | Admitting: Medical

## 2019-10-03 ENCOUNTER — Ambulatory Visit (HOSPITAL_COMMUNITY)
Admission: RE | Admit: 2019-10-03 | Discharge: 2019-10-03 | Disposition: A | Discharge status: Home / Self Care | Payer: BC Managed Care – PPO | Source: Ambulatory Visit | Attending: General Surgery | Admitting: General Surgery

## 2019-10-03 ENCOUNTER — Other Ambulatory Visit: Payer: Self-pay

## 2019-10-03 DIAGNOSIS — I10 Essential (primary) hypertension: Secondary | ICD-10-CM | POA: Diagnosis not present

## 2019-10-03 DIAGNOSIS — Z01818 Encounter for other preprocedural examination: Secondary | ICD-10-CM | POA: Diagnosis not present

## 2019-10-13 DIAGNOSIS — E7800 Pure hypercholesterolemia, unspecified: Secondary | ICD-10-CM | POA: Diagnosis not present

## 2019-10-13 DIAGNOSIS — R7303 Prediabetes: Secondary | ICD-10-CM | POA: Diagnosis not present

## 2019-10-18 ENCOUNTER — Other Ambulatory Visit: Payer: Self-pay

## 2019-10-18 ENCOUNTER — Encounter: Payer: Self-pay | Admitting: Dietician

## 2019-10-18 ENCOUNTER — Encounter: Payer: BC Managed Care – PPO | Attending: General Surgery | Admitting: Dietician

## 2019-10-18 DIAGNOSIS — E669 Obesity, unspecified: Secondary | ICD-10-CM | POA: Diagnosis not present

## 2019-10-18 NOTE — Patient Instructions (Addendum)
Begin working on the [employer] discussed today, starting with the following:  . Aim for 64-100 ounces of FLUID daily (with at least half of fluid intake being plain water)   See you next month!

## 2019-10-18 NOTE — Progress Notes (Signed)
Nutrition Assessment for Bariatric Surgery Medical Nutrition Therapy  Appt Start Time: 2:10pm     End Time: 3:05pm  Patient was seen on 10/18/2019 for Pre-Operative Nutrition Assessment. Letter of approval faxed to [facility] bariatric surgery program coordinator on 10/18/2019.   Referral stated Supervised Weight Loss (SWL) visits needed: 6 *Pt has already completed 1 with her PCP  Planned surgery: Sleeve Gastrectomy  Pt expectation of surgery: to be healthier, prevent future health problems, avoid needing medications    NUTRITION ASSESSMENT   Anthropometrics  Start weight at [facility]: 204 lbs (date: 10/18/2019) Height: 60 in BMI: 39.8 kg/m2     Lifestyle & Dietary Hx Lives with her 34 year old, works from home for [employer]. May have chicken + broccoli + rice. May eat [employer], fried fish, Jamaican food, air fried food, spaghetti, etc. Does not drink much fluid, pt estimates ~16 oz/day.   24-Hr Dietary Recall First Meal: chicken sausage + egg + bagel  Snack: yogurt + granola  Second Meal: fried chicken  Snack: almonds Third Meal: spaghetti  Snack: -  Beverages: water, diet green tea    NUTRITION DIAGNOSIS  Overweight/obesity (Gates-3.3) related to past poor dietary habits and physical inactivity as evidenced by patient w/ planned Sleeve Gastrectomy surgery following dietary guidelines for continued weight loss.    NUTRITION INTERVENTION  Nutrition counseling (C-1) and education (E-2) to facilitate bariatric surgery goals.  Pre-Op Goals Reviewed with the Patient . Track food and beverage intake (pen and paper, MyFitness Pal, Baritastic app, etc.) . Make healthy food choices while monitoring portion sizes . Consume 3 meals per day or try to eat every 3-5 hours . Avoid concentrated sugars and fried foods . Keep sugar & fat in the single digits per serving on food labels . Practice CHEWING your food (aim for applesauce consistency) . Practice not drinking 15 minutes  before, during, and 30 minutes after each meal and snack . Avoid all carbonated beverages (ex: soda, sparkling beverages)  . Limit caffeinated beverages (ex: coffee, tea, energy drinks) . Avoid all sugar-sweetened beverages (ex: regular soda, sports drinks)  . Avoid alcohol  . Aim for 64-100 ounces of FLUID daily (with at least half of fluid intake being plain water)  . Aim for at least 60-80 grams of PROTEIN daily . Look for a liquid protein source that contains ?15 g protein and ?5 g carbohydrate (ex: shakes, drinks, shots) . Make a list of non-food related activities . Physical activity is an important part of a healthy lifestyle so keep it moving! The goal is to reach 150 minutes of exercise per week, including cardiovascular and weight baring activity.  *Goals that are bolded indicate the pt would like to start working towards these  Handouts Provided Include  . Bariatric Surgery handouts (Nutrition Visits, Pre-Op Goals, Protein Shakes, Vitamins & Minerals)  Learning Style & Readiness for Change Teaching method utilized: Visual & Auditory  Demonstrated degree of understanding via: Teach Back  Barriers to learning/adherence to lifestyle change: None Identified    MONITORING & EVALUATION Dietary intake, weekly physical activity, body weight, and pre-op goals reached at next nutrition visit.   Next Steps Patient is to return to [facility] in 1 month for 1st SWL Visit.

## 2019-11-08 ENCOUNTER — Ambulatory Visit (INDEPENDENT_AMBULATORY_CARE_PROVIDER_SITE_OTHER): Payer: BC Managed Care – PPO | Admitting: Psychology

## 2019-11-08 DIAGNOSIS — F509 Eating disorder, unspecified: Secondary | ICD-10-CM

## 2019-11-17 ENCOUNTER — Other Ambulatory Visit: Payer: Self-pay

## 2019-11-17 ENCOUNTER — Encounter: Payer: Self-pay | Admitting: Dietician

## 2019-11-17 ENCOUNTER — Encounter: Payer: BC Managed Care – PPO | Attending: General Surgery | Admitting: Dietician

## 2019-11-17 DIAGNOSIS — E669 Obesity, unspecified: Secondary | ICD-10-CM | POA: Diagnosis not present

## 2019-11-17 NOTE — Patient Instructions (Signed)
Work on chewing each bite very thoroughly to applesauce consistency before swallowing.   Great job with drinking more water!!

## 2019-11-17 NOTE — Progress Notes (Signed)
Supervised Weight Loss Visit Bariatric Nutrition Education  Planned Surgery: Sleeve  Pt Expectation of Surgery/ Goals: to be healthier, prevent future health problems, avoid needing medications   2nd out of 6 SWL Appointments    NUTRITION ASSESSMENT  Anthropometrics  Start weight at [facility]: 204 lbs (date: 10/18/2019) Today's weight: 203.6 lbs BMI: 39.8 kg/m2    Lifestyle & Dietary Hx Patient states she has increased her water intake from 1 bottle per day to 3-4 bottles per day. Typical meal pattern is 2 meals per day plus maybe 1-2 snacks.   Estimated daily fluid intake: 48-64 oz Supplements: none Current average weekly physical activity: ADLs, playing with daughter  24-Hr Dietary Recall First Meal: bagel + apple  Snack: popcorn  Second Meal: - Snack: - Third Meal: hamburger + mashed potatoes + gravy + corn Snack: - Beverages: water, diet green tea, ginger ale     NUTRITION DIAGNOSIS  Overweight/obesity ([place]-3.3) related to past poor dietary habits and physical inactivity as evidenced by patient w/ planned Sleeve Gastrectomy surgery following dietary guidelines for continued weight loss.   NUTRITION INTERVENTION  Nutrition counseling (C-1) and education (E-2) to facilitate bariatric surgery goals.  Pre-Op Goals Progress & New Goals . Increasing water intake (up to 48-64 oz per day)  . NEW: Practice chewing food (aim for applesauce consistency)   Learning Style & Readiness for Change Teaching method utilized: Visual & Auditory  Demonstrated degree of understanding via: Teach Back  Barriers to learning/adherence to lifestyle change: None Identified   MONITORING & EVALUATION Dietary intake, weekly physical activity, body weight, and pre-op goals in 1 month.   Next Steps  Patient is to return to [facility] in 1 month for 3rd SWL.

## 2019-11-19 DIAGNOSIS — R0981 Nasal congestion: Secondary | ICD-10-CM | POA: Diagnosis not present

## 2019-11-19 DIAGNOSIS — K529 Noninfective gastroenteritis and colitis, unspecified: Secondary | ICD-10-CM | POA: Diagnosis not present

## 2019-11-19 DIAGNOSIS — M791 Myalgia, unspecified site: Secondary | ICD-10-CM | POA: Diagnosis not present

## 2019-11-24 ENCOUNTER — Ambulatory Visit: Payer: BC Managed Care – PPO | Admitting: Psychology

## 2019-12-07 DIAGNOSIS — F331 Major depressive disorder, recurrent, moderate: Secondary | ICD-10-CM | POA: Diagnosis not present

## 2019-12-14 DIAGNOSIS — F331 Major depressive disorder, recurrent, moderate: Secondary | ICD-10-CM | POA: Diagnosis not present

## 2019-12-15 ENCOUNTER — Encounter: Payer: BC Managed Care – PPO | Attending: General Surgery | Admitting: Dietician

## 2019-12-15 ENCOUNTER — Other Ambulatory Visit: Payer: Self-pay

## 2019-12-15 ENCOUNTER — Encounter: Payer: Self-pay | Admitting: Dietician

## 2019-12-15 DIAGNOSIS — E669 Obesity, unspecified: Secondary | ICD-10-CM | POA: Insufficient documentation

## 2019-12-15 NOTE — Progress Notes (Signed)
Supervised Weight Loss Visit Bariatric Nutrition Education  Planned Surgery: Sleeve  Pt Expectation of Surgery/ Goals: to be healthier, prevent future health problems, avoid needing medications   3rd out of 6 SWL Appointments    NUTRITION ASSESSMENT  Anthropometrics  Start weight at [facility]: 204 lbs (date: 10/18/2019) Today's weight: 199.7 lbs BMI: 39 kg/m2    Lifestyle & Dietary Hx Patient states her water has decreased a bit to 2-3 bottles per day. Typical meal pattern is 2-3 meals per day, states she doesn't have good snacks in the house so she has been avoiding snacking mostly but may have a chewy granola bar. States she has been working on chewing some.    Estimated daily fluid intake: 48-64 oz Supplements: none Current average weekly physical activity: ADLs, playing with daughter  24-Hr Dietary Recall First Meal: bagel + coffee (or bacon + eggs + toast)  Snack: - Second Meal: frozen meal such as chicken pot pie  Snack: - Third Meal: baked chicken + green beans  Snack: - Beverages: water, coffee     NUTRITION DIAGNOSIS  Overweight/obesity ([place]-3.3) related to past poor dietary habits and physical inactivity as evidenced by patient w/ planned Sleeve Gastrectomy surgery following dietary guidelines for continued weight loss.   NUTRITION INTERVENTION  Nutrition counseling (C-1) and education (E-2) to facilitate bariatric surgery goals.  Pre-Op Goals Progress & New Goals . Working on water intake  . Practicing chewing food  . NEW: Eat at least 3 times per day   Learning Style & Readiness for Change Teaching method utilized: Visual & Auditory  Demonstrated degree of understanding via: Teach Back  Barriers to learning/adherence to lifestyle change: None Identified\  Handouts Provided Include  Bariatric Snack Ideas    MONITORING & EVALUATION Dietary intake, weekly physical activity, body weight, and pre-op goals in 1 month.   Next Steps  Patient is to return to  [facility] in 1 month for 4th SWL.

## 2019-12-15 NOTE — Patient Instructions (Addendum)
   Aim to drink plenty of water (the goal is 64 ounces per day)  Continue to work on chewing food thoroughly   Try to eat at least 3 times per day (meals and/or snacks), and try to listen to when your body is telling you it is hungry

## 2020-01-19 ENCOUNTER — Encounter: Payer: Self-pay | Admitting: Dietician

## 2020-01-19 ENCOUNTER — Other Ambulatory Visit: Payer: Self-pay

## 2020-01-19 ENCOUNTER — Encounter: Payer: BC Managed Care – PPO | Attending: General Surgery | Admitting: Dietician

## 2020-01-19 DIAGNOSIS — E669 Obesity, unspecified: Secondary | ICD-10-CM | POA: Diagnosis not present

## 2020-01-19 NOTE — Progress Notes (Signed)
Supervised Weight Loss Visit Bariatric Nutrition Education  Planned Surgery: Sleeve  Pt Expectation of Surgery/ Goals: to be healthier, prevent future health problems, avoid needing medications   4th out of 6 SWL Appointments    NUTRITION ASSESSMENT  Anthropometrics  Start weight at [facility]: 204 lbs (date: 10/18/2019) Today's weight: 198.2 BMI: 38.7 kg/m2    Lifestyle & Dietary Hx Patient states she is continuing to work at drinking water, usually gets ~3 bottles in per day. Typical meal pattern is 3 meals per day plus maybe a snack. States she is being more intentional with eating breakfast, and may have oatmeal for breakfast. Tries to incorporate a vegetable with lunch. May have a snack during the day such as ham and cheese.   Estimated daily fluid intake: 48-64 oz Supplements: none Current average weekly physical activity: ADLs, playing with daughter  24-Hr Dietary Recall First Meal: yogurt + granola + banana  Snack: - Second Meal: salad  Snack: almonds Third Meal: baked chicken + green beans  Snack: - Beverages: water, Crystal Light, coffee     NUTRITION DIAGNOSIS  Overweight/obesity ([place]-3.3) related to past poor dietary habits and physical inactivity as evidenced by patient w/ planned Sleeve Gastrectomy surgery following dietary guidelines for continued weight loss.   NUTRITION INTERVENTION  Nutrition counseling (C-1) and education (E-2) to facilitate bariatric surgery goals.  Pre-Op Goals Progress & New Goals . Working on water intake  . Practicing chewing food  . Eat at least 3 times per day  . NEW: Avoid drinking fluids with meals  Learning Style & Readiness for Change Teaching method utilized: Visual & Auditory  Demonstrated degree of understanding via: Teach Back  Barriers to learning/adherence to lifestyle change: None Identified  Handouts Provided Include  Protein Foods List    MONITORING & EVALUATION Dietary intake, weekly physical activity, body  weight, and pre-op goals in 1 month.   Next Steps  Patient is to return to [facility] in 1 month for 5th SWL.

## 2020-02-07 DIAGNOSIS — F331 Major depressive disorder, recurrent, moderate: Secondary | ICD-10-CM | POA: Diagnosis not present

## 2020-02-15 ENCOUNTER — Other Ambulatory Visit: Payer: Self-pay

## 2020-02-15 ENCOUNTER — Encounter: Payer: BC Managed Care – PPO | Attending: General Surgery | Admitting: Dietician

## 2020-02-15 ENCOUNTER — Encounter: Payer: Self-pay | Admitting: Dietician

## 2020-02-15 DIAGNOSIS — E669 Obesity, unspecified: Secondary | ICD-10-CM | POA: Insufficient documentation

## 2020-02-15 NOTE — Progress Notes (Signed)
Supervised Weight Loss Visit Bariatric Nutrition Education  Planned Surgery: Sleeve  Pt Expectation of Surgery/ Goals: to be healthier, prevent future health problems, avoid needing medications, set good example for daughter    5th out of 6 SWL Appointments    NUTRITION ASSESSMENT  Anthropometrics  Start weight at [facility]: 204 lbs (date: 10/18/2019) Today's weight: 200.4 lbs BMI: 39.1 kg/m2    Lifestyle & Dietary Hx Patient states she is continuing to work at drinking water, not drinking with meals (which has been fairly easy since she usually does not do this), and chewing thoroughly. Typical meal pattern is 3 meals per day plus maybe a snack. States she has been more intentional with eating fruits and vegetables, and encouraging her 49 year old daughter to do the same.   Estimated daily fluid intake: 48-64 oz Supplements: none Current average weekly physical activity: ADLs, playing with daughter  24-Hr Dietary Recall First Meal: Honey Nut Cheerios  Snack: - Second Meal: leftover pot roast + potatoes  Snack: yogurt  Third Meal: fried chicken + vegetables  Snack: - Beverages: water, Crystal Light, coffee     NUTRITION DIAGNOSIS  Overweight/obesity ([place]-3.3) related to past poor dietary habits and physical inactivity as evidenced by patient w/ planned Sleeve Gastrectomy surgery following dietary guidelines for continued weight loss.   NUTRITION INTERVENTION  Nutrition counseling (C-1) and education (E-2) to facilitate bariatric surgery goals.  Pre-Op Goals Progress & New Goals . Working on water intake  . Practicing chewing food  . Eating at least 3 times per day  . Avoids drinking fluids with meals  Learning Style & Readiness for Change Teaching method utilized: Visual & Auditory  Demonstrated degree of understanding via: Teach Back  Barriers to learning/adherence to lifestyle change: None Identified   MONITORING & EVALUATION Dietary intake, weekly physical activity,  body weight, and pre-op goals in 1 month.   Next Steps  Patient is to return to [facility] in 1 month for 6th SWL.

## 2020-03-13 DIAGNOSIS — F331 Major depressive disorder, recurrent, moderate: Secondary | ICD-10-CM | POA: Diagnosis not present

## 2020-03-21 ENCOUNTER — Encounter: Payer: BC Managed Care – PPO | Attending: General Surgery | Admitting: Skilled Nursing Facility1

## 2020-03-21 ENCOUNTER — Other Ambulatory Visit: Payer: Self-pay

## 2020-03-21 DIAGNOSIS — E669 Obesity, unspecified: Secondary | ICD-10-CM | POA: Diagnosis not present

## 2020-03-21 NOTE — Progress Notes (Signed)
Supervised Weight Loss Visit Bariatric Nutrition Education  Planned Surgery: Sleeve  Pt Expectation of Surgery/ Goals: to be healthier, prevent future health problems, avoid needing medications, set good example for daughter    5th out of 6 SWL Appointments    NUTRITION ASSESSMENT  Anthropometrics  Start weight at [facility]: 204 lbs (date: 10/18/2019) Today's weight: 199.1 lbs BMI: 38.88 kg/m2    Lifestyle & Dietary Hx Patient states she is continuing to work at drinking water, not drinking with meals (which has been fairly easy since she usually does not do this), and chewing thoroughly. Typical meal pattern is 3 meals per day plus maybe a snack. States she has been more intentional with eating fruits and vegetables, and encouraging her 82 year old daughter to do the same.   Pt states she has learned a lot in these last visits: drinking more water, chewing until appleslauce  Consistency, and categorizing different foods. Pt states she thinks she will struggle with drinking enough water after surgery.   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   Estimated daily fluid intake: 48-64 oz Supplements: none Current average weekly physical activity: ADLs, playing with daughter  24-Hr Dietary Recall First Meal: Honey Nut Cheerios or bacon and eggs + toast  Snack: - Second Meal: leftover pot roast + potatoes or tuna sandwich Snack: yogurt  Third Meal: fried chicken + vegetables  Snack: - Beverages: water, Crystal Light, coffee     NUTRITION DIAGNOSIS  Overweight/obesity ([place]-3.3) related to past poor dietary habits and physical inactivity as evidenced by patient w/ planned Sleeve Gastrectomy surgery following dietary guidelines for continued weight loss.   NUTRITION INTERVENTION  Nutrition counseling (C-1) and education (E-2) to facilitate bariatric surgery goals.  Pre-Op Goals Progress & New Goals . Working on water intake  . Practicing chewing food  . Eating  at least 3 times per day  . Avoids drinking fluids with meals . Aim for non starchy vegetables 2 times a day 7 days a week  Learning Style & Readiness for Change Teaching method utilized: Visual & Auditory  Demonstrated degree of understanding via: Teach Back  Barriers to learning/adherence to lifestyle change: None Identified   MONITORING & EVALUATION Dietary intake, weekly physical activity, body weight, and pre-op goals in 1 month.   Next Steps  Patient is to return to [facility] for pre-op class

## 2020-03-22 ENCOUNTER — Encounter: Payer: BC Managed Care – PPO | Admitting: Dietician

## 2020-03-27 DIAGNOSIS — F331 Major depressive disorder, recurrent, moderate: Secondary | ICD-10-CM | POA: Diagnosis not present

## 2020-04-17 DIAGNOSIS — F331 Major depressive disorder, recurrent, moderate: Secondary | ICD-10-CM | POA: Diagnosis not present

## 2020-05-30 ENCOUNTER — Other Ambulatory Visit: Payer: Self-pay

## 2020-05-30 ENCOUNTER — Ambulatory Visit: Payer: BC Managed Care – PPO | Admitting: Medical

## 2020-05-30 ENCOUNTER — Encounter: Payer: Self-pay | Admitting: Medical

## 2020-05-30 VITALS — BP 138/86 | HR 84 | Ht 60.0 in | Wt 200.0 lb

## 2020-05-30 DIAGNOSIS — L83 Acanthosis nigricans: Secondary | ICD-10-CM

## 2020-05-30 DIAGNOSIS — Z7185 Encounter for immunization safety counseling: Secondary | ICD-10-CM

## 2020-05-30 DIAGNOSIS — R7301 Impaired fasting glucose: Secondary | ICD-10-CM

## 2020-05-30 DIAGNOSIS — D259 Leiomyoma of uterus, unspecified: Secondary | ICD-10-CM | POA: Diagnosis not present

## 2020-05-30 DIAGNOSIS — Z0000 Encounter for general adult medical examination without abnormal findings: Secondary | ICD-10-CM | POA: Diagnosis not present

## 2020-05-30 DIAGNOSIS — Z8249 Family history of ischemic heart disease and other diseases of the circulatory system: Secondary | ICD-10-CM

## 2020-05-30 DIAGNOSIS — A6000 Herpesviral infection of urogenital system, unspecified: Secondary | ICD-10-CM

## 2020-05-30 DIAGNOSIS — R7989 Other specified abnormal findings of blood chemistry: Secondary | ICD-10-CM

## 2020-05-30 DIAGNOSIS — E782 Mixed hyperlipidemia: Secondary | ICD-10-CM | POA: Diagnosis not present

## 2020-05-30 DIAGNOSIS — L989 Disorder of the skin and subcutaneous tissue, unspecified: Secondary | ICD-10-CM

## 2020-05-30 DIAGNOSIS — Z6839 Body mass index (BMI) 39.0-39.9, adult: Secondary | ICD-10-CM

## 2020-05-30 LAB — LIPID PANEL

## 2020-05-30 MED ORDER — VALACYCLOVIR HCL 1 G PO TABS: ORAL_TABLET | ORAL | 2 refills | Status: DC

## 2020-05-30 NOTE — Progress Notes (Signed)
Subjective:   HPI  Laura Cross is a 34 y.o. female who presents for Chief Complaint  Patient presents with  . Annual Exam    with fasting labs     Patient Care Team: Tysinger, Leward Quan as PCP - General (Family Medicine) [place] eye doctor:Dr. [facility] Bariatric clinic  Prior with [facility]   Concerns: Needs appeal form done for BMI Screening.  She has been seeing the bariatric clinic however she has a hang up with a counselor.  She started doing the counseling part and all of a sudden the counselor has gone AWOL.  She had already been doing nutrition counseling but the psychologist has disappeared and this is held up her continuation.  So she is in limbo at the moment.  This is been frustrating for her  Gynecological history: last pap 06/02/2019  2 pregnancies, 1 live birth. Periods are Regular, periods are heavy.  She gets pelvic pain from time to time. She notes intermittent pelvic and lower abdominal pains.   Periods are relatively heavy.  Has hx/o fibroids.   periods are regular.  Not on birth control.   Not currently sexually active.    Has trouble with sense of smell for months.  Does sometimes get congested.  No prior allergy problems.  Not a lot of problems with sneezing or problems in allergy season.  grandfather seemed to have problems with sense of smell as he got older.  She notes her taste seems fine.   No numbness, no tingling, no other senses seem different.   No headaches, no hearing or vision changes.    She has a new skin lesion of the left face under the eye, growing in the last few months  History of genital herpes.  Has had at least 2 flareups in the past year.  She has never been on Valtrex in the past.  Past Medical History:  Diagnosis Date  . Family history of premature CAD    father  . Fibroids   . Impaired fasting blood sugar   . Miscarriage 2014  . Mixed dyslipidemia   . Ovarian  cyst   . Sexually transmitted disease (STD)    hx/o HPV, gonorrhea, chlamydia, genital herpes  . Syphilis 2010    Family History  Problem Relation Age of Onset  . Heart disease Father 59       died MI  . Hypertension Maternal Grandmother   . Diabetes Maternal Grandmother   . Stroke Maternal Grandmother   . Heart disease Maternal Grandmother   . Hypertension Maternal Grandfather   . COPD Maternal Grandfather   . Cancer Maternal Grandfather 60       colon  . GER disease Mother   . Deep vein thrombosis Mother        with MVA  . Heart disease Mother 77       LVH  . COPD Paternal Grandfather   . Pneumonia Brother   . Cancer Maternal Aunt        breast  . Cancer Cousin        breast     Current Outpatient Medications:  .  valACYclovir (VALTREX) 1000 MG tablet, 2 tablets po BID x 1 day for flare up, Disp: 20 tablet, Rfl: 2  Allergies  Allergen Reactions  . Meloxicam Other (See Comments)    Bleeding?  Can tolerate other NSAIDs  . Metformin And Related Diarrhea and Other (See  Comments)    hair falling out  . Nuvaring [Etonogestrel-Ethinyl Estradiol] Other (See Comments)    Flu-like symptoms     Reviewed their medical, surgical, family, social, medication, and allergy history and updated chart as appropriate.   Review of Systems Constitutional: -fever, -chills, -sweats, -unexpected weight change, -decreased appetite, -fatigue Allergy: -sneezing, -itching, -congestion Dermatology:+changing moles, --rash, -lumps ENT: -runny nose, -ear pain, -sore throat, -hoarseness, -sinus pain, -teeth pain, - ringing in ears, -hearing loss, -nosebleeds Cardiology: -chest pain, -palpitations, -swelling, -difficulty breathing when lying flat, -waking up short of breath Respiratory: -cough, -shortness of breath, -difficulty breathing with exercise or exertion, -wheezing, -coughing up blood Gastroenterology: -abdominal pain, -nausea, -vomiting, -diarrhea, -constipation, -blood in stool,  -changes in bowel movement, -difficulty swallowing or eating Hematology: -bleeding, -bruising  Musculoskeletal: -joint aches, -muscle aches, -joint swelling, -back pain, -neck pain, -cramping, -changes in gait Ophthalmology: denies vision changes, eye redness, itching, discharge Urology: -burning with urination, -difficulty urinating, -blood in urine, -urinary frequency, -urgency, -incontinence Neurology: -headache, -weakness, -tingling, -numbness, -memory loss, -falls, -dizziness Psychology: -depressed mood, -agitation, -sleep problems Breast/gyn: -breast tendnerss, -discharge, -lumps, -vaginal discharge     Objective:  BP 138/86   Pulse 84   Ht 5' (1.524 m)   Wt 200 lb (90.7 kg)   LMP 05/10/2020 (Within Days)   SpO2 96%   BMI 39.06 kg/m   BP: 138/86  BP Readings from Last 3 Encounters:  05/30/20 138/86  08/18/19 120/78  06/02/19 116/80    General appearance: alert, no distress, WD/WN, African American female Skin: Several small gross of the face inferior orbits suggestive of skin tags, left inferior orbit region with 3 mm roundish raised growth that is new, otherwise no new worrisome lesions HEENT: normocephalic, conjunctiva/corneas normal, sclerae anicteric, PERRLA, EOMi, nares patent, no discharge or erythema, pharynx normal Oral cavity: MMM, tongue normal, teeth normal Neck: supple, no lymphadenopathy, no thyromegaly, no masses, normal ROM, no bruits Chest: non tender, normal shape and expansion Heart: RRR, normal S1, S2, no murmurs Lungs: CTA bilaterally, no wheezes, rhonchi, or rales Abdomen: +bs, soft, non tender, non distended, no masses, no hepatomegaly, no splenomegaly, no bruits Back: non tender, normal ROM, no scoliosis Musculoskeletal: upper extremities non tender, no obvious deformity, normal ROM throughout, lower extremities non tender, no obvious deformity, normal ROM throughout Extremities: no edema, no cyanosis, no clubbing Pulses: 2+ symmetric, upper and  lower extremities, normal cap refill Neurological: alert, oriented x 3, CN2-12 intact, strength normal upper extremities and lower extremities, sensation normal throughout, DTRs 2+ throughout, no cerebellar signs, gait normal Psychiatric: normal affect, behavior normal, pleasant  Breast/gyn/rectal - deferred to gynecology    Assessment and Plan :   Encounter Diagnoses  Name Primary?  . Encounter for health maintenance examination in adult Yes  . Impaired fasting blood sugar   . Acanthosis nigricans   . Uterine leiomyoma, unspecified location   . Elevated liver function tests   . Family history of premature CAD   . Mixed dyslipidemia   . BMI 39.0-39.9,adult   . Vaccine counseling   . Genital herpes simplex, unspecified site   . Skin lesion     Physical exam - discussed and counseled on healthy lifestyle, diet, exercise, preventative care, vaccinations, sick and well care, proper use of emergency dept and after hours care, and addressed their concerns.    Health screening: Advised they see their eye doctor yearly for routine vision care. Advised they see their dentist yearly for routine dental care including hygiene visits twice  yearly.  Cancer screening Counseled on self breast exams, mammograms, cervical cancer screening  2020 pap reviewed   Vaccinations: Advised yearly influenza vaccine Advised Covid vaccine. She declines Covid and flu vaccines.  She is up-to-date on tetanus vaccine     Separate significant issues discussed: Impaired fasting glucose on prior labs-updated fasting labs today  Obesity-advise she contact the supervisor for the bariatric clinic about the trouble she is having with the counselor so she can continue with the program.  We will also call to try to help facilitate her process.  I will work on her forms today for completion  Continue efforts to lose weight through healthy eating, exercise.  She needs increased intensity and frequency of  exercise  Dyslipidemia-counseled on nutrition  Family history of premature CAD-discussed significance of this and need for decreased animal fats on low-cholesterol diet  Elevated LFTs-likely fatty liver disease.  She has not yet done ultrasound of liver.  Pending labs we will likely pursue this  Genital herpes-I prescribed Valtrex to use for outbreaks.  Discussed proper use of medication, prevention of spread  New skin lesion of face -referral to dermatology  History of fibroids, heavy periods-referral back to gynecology, [place] was seen today for annual exam.  Diagnoses and all orders for this visit:  Encounter for health maintenance examination in adult -     Comprehensive metabolic panel -     Lipid panel -     Hemoglobin A1c -     CBC  Impaired fasting blood sugar -     Hemoglobin A1c  Acanthosis nigricans  Uterine leiomyoma, unspecified location -     Ambulatory referral to Gynecology  Elevated liver function tests  Family history of premature CAD  Mixed dyslipidemia -     Lipid panel  BMI 39.0-39.9,adult  Vaccine counseling  Genital herpes simplex, unspecified site  Skin lesion -     Ambulatory referral to Dermatology  Other orders -     valACYclovir (VALTREX) 1000 MG tablet; 2 tablets po BID x 1 day for flare up   Follow-up pending labs, yearly for physical

## 2020-05-31 ENCOUNTER — Other Ambulatory Visit: Payer: Self-pay | Admitting: Medical

## 2020-05-31 LAB — CBC
Hematocrit: 34.1 % (ref 34.0–46.6)
Hemoglobin: 11 g/dL — ABNORMAL LOW (ref 11.1–15.9)
MCH: 24.7 pg — ABNORMAL LOW (ref 26.6–33.0)
MCHC: 32.3 g/dL (ref 31.5–35.7)
MCV: 77 fL — ABNORMAL LOW (ref 79–97)
Platelets: 302 10*3/uL (ref 150–450)
RBC: 4.46 x10E6/uL (ref 3.77–5.28)
RDW: 15.1 % (ref 11.7–15.4)
WBC: 4.5 10*3/uL (ref 3.4–10.8)

## 2020-05-31 LAB — COMPREHENSIVE METABOLIC PANEL
ALT: 31 IU/L (ref 0–32)
AST: 20 IU/L (ref 0–40)
Albumin/Globulin Ratio: 1.4 (ref 1.2–2.2)
Albumin: 4.5 g/dL (ref 3.8–4.8)
Alkaline Phosphatase: 79 IU/L (ref 44–121)
BUN/Creatinine Ratio: 19 (ref 9–23)
BUN: 16 mg/dL (ref 6–20)
Bilirubin Total: 0.4 mg/dL (ref 0.0–1.2)
CO2: 22 mmol/L (ref 20–29)
Calcium: 9.6 mg/dL (ref 8.7–10.2)
Chloride: 101 mmol/L (ref 96–106)
Creatinine, Ser: 0.85 mg/dL (ref 0.57–1.00)
GFR calc Af Amer: 104 mL/min/{1.73_m2} (ref >59)
GFR calc non Af Amer: 90 mL/min/{1.73_m2} (ref >59)
Globulin, Total: 3.2 g/dL (ref 1.5–4.5)
Glucose: 87 mg/dL (ref 65–99)
Potassium: 4.4 mmol/L (ref 3.5–5.2)
Sodium: 138 mmol/L (ref 134–144)
Total Protein: 7.7 g/dL (ref 6.0–8.5)

## 2020-05-31 LAB — HEMOGLOBIN A1C
Est. average glucose Bld gHb Est-mCnc: 126 mg/dL
Hgb A1c MFr Bld: 6 % — ABNORMAL HIGH (ref 4.8–5.6)

## 2020-05-31 LAB — LIPID PANEL
Chol/HDL Ratio: 6.9 ratio — ABNORMAL HIGH (ref 0.0–4.4)
Cholesterol, Total: 269 mg/dL — ABNORMAL HIGH (ref 100–199)
HDL: 39 mg/dL — ABNORMAL LOW (ref >39)
LDL Chol Calc (NIH): 195 mg/dL — ABNORMAL HIGH (ref 0–99)
Triglycerides: 183 mg/dL — ABNORMAL HIGH (ref 0–149)
VLDL Cholesterol Cal: 35 mg/dL (ref 5–40)

## 2020-05-31 MED ORDER — ROSUVASTATIN CALCIUM 10 MG PO TABS: 10.0000 mg | ORAL_TABLET | Freq: Every day | ORAL | 3 refills | Status: DC

## 2020-05-31 NOTE — Progress Notes (Signed)
All referrals sent.

## 2020-07-16 ENCOUNTER — Other Ambulatory Visit: Payer: Self-pay

## 2020-07-16 ENCOUNTER — Telehealth (INDEPENDENT_AMBULATORY_CARE_PROVIDER_SITE_OTHER): Payer: BC Managed Care – PPO | Admitting: Medical

## 2020-07-16 ENCOUNTER — Encounter: Payer: Self-pay | Admitting: Medical

## 2020-07-16 VITALS — Temp 97.9°F | Ht 61.0 in | Wt 197.0 lb

## 2020-07-16 DIAGNOSIS — J988 Other specified respiratory disorders: Secondary | ICD-10-CM | POA: Diagnosis not present

## 2020-07-16 DIAGNOSIS — R059 Cough, unspecified: Secondary | ICD-10-CM | POA: Diagnosis not present

## 2020-07-16 MED ORDER — ALBUTEROL SULFATE HFA 108 (90 BASE) MCG/ACT IN AERS: 2.0000 | INHALATION_SPRAY | Freq: Four times a day (QID) | RESPIRATORY_TRACT | 0 refills | Status: DC | PRN

## 2020-07-16 MED ORDER — HYDROCODONE-HOMATROPINE 5-1.5 MG/5ML PO SYRP
5.0000 mL | ORAL_SOLUTION | Freq: Three times a day (TID) | ORAL | 0 refills | Status: AC | PRN
Start: 2020-07-16 — End: 2020-07-21

## 2020-07-16 NOTE — Progress Notes (Signed)
Subjective:     Patient ID: Laura Cross, female   DOB: 15-May-1986, 34 y.o.   MRN: [id number]  This visit type was conducted due to national recommendations for restrictions regarding the COVID-19 Pandemic (e.g. social distancing) in an effort to limit this patient's exposure and mitigate transmission in our community.  Due to their co-morbid illnesses, this patient is at least at moderate risk for complications without adequate follow up.  This format is felt to be most appropriate for this patient at this time.    Documentation for virtual audio and video telecommunications through [place] encounter:  The patient was located at home. The provider was located in the office. The patient did consent to this visit and is aware of possible charges through their insurance for this visit.  The other persons participating in this telemedicine service were none. Time spent on call was 20 minutes and in review of previous records 20 minutes total.  This virtual service is not related to other E/M service within previous 7 days.   HPI Chief Complaint  Patient presents with  . Cough    x2 weeks    Virtual consult today for cough, illness.  Has lingering cough for 2 weeks.  iniitally had chills, aches, ear discomfort, lost some smell, but never lost taste.   Never felt horrible.  During day ok, but at night brutal cough.  Has some sore throat from coughing so much.  No fever, no current body aches, no current ear pain.  No NVD.  No SOB.  Does feel some wheezing.  No belly discomfort.  Is blowing out some colored mucous.   More of a dry cough today.   Compared to 2 weeks ago feels a little better in last few days.    No hx/o asthma or smoking.   Using delsym and mucinex OTC.   No calf pain, no hemoptysis.   Has not had covid vaccine, and has not done covid test.  No sick contacts.  No other aggravating or relieving factors. No other complaint.    Review of Systems As in subjective     Objective:   Physical Exam Due to coronavirus pandemic stay at home measures, patient visit was virtual and they were not examined in person.   Temp 97.9 F (36.6 C)   Ht 5\' 1"  (1.549 m)   Wt 197 lb (89.4 kg)   BMI 37.22 kg/m      Assessment:     Encounter Diagnoses  Name Primary?  . Cough Yes  . Respiratory tract infection        Plan:     We discussed her symptoms and concerns suggestive of respiratory tract infection.  Overall she does not sound terrible on the phone and she feels like things could be improving.  Begin medication as below.  Discussed proper use of medicines and discussed risk and benefits of medications.  Offered chest x-ray but she declines for now.  We discussed limitations of virtual consult.  Her symptoms are typically worse at night and not so bad at all during the day so she could just have a mild viral respiratory tract infection.  Advise if not seeing some improvements within the next 72 hours then let us plan to get a chest x-ray or do in person visit.  We discussed Covid testing that she is already 2 weeks into symptoms, and a positive test would only be to help with the diagnosis but would not change therapy at this  point without chest x-ray findings or significant exam findings.  She voiced agreement and understanding of plan  [name] was seen today for cough.  Diagnoses and all orders for this visit:  Cough  Respiratory tract infection  Other orders -     albuterol (VENTOLIN HFA) 108 (90 Base) MCG/ACT inhaler; Inhale 2 puffs into the lungs every 6 (six) hours as needed for wheezing or shortness of breath. -     HYDROcodone-homatropine (HYCODAN) 5-1.5 MG/5ML syrup; Take 5 mLs by mouth every 8 (eight) hours as needed for up to 5 days.

## 2020-07-17 DIAGNOSIS — N939 Abnormal uterine and vaginal bleeding, unspecified: Secondary | ICD-10-CM | POA: Diagnosis not present

## 2020-07-17 DIAGNOSIS — D259 Leiomyoma of uterus, unspecified: Secondary | ICD-10-CM | POA: Diagnosis not present

## 2020-08-15 DIAGNOSIS — F509 Eating disorder, unspecified: Secondary | ICD-10-CM | POA: Diagnosis not present

## 2020-08-27 DIAGNOSIS — N921 Excessive and frequent menstruation with irregular cycle: Secondary | ICD-10-CM | POA: Diagnosis not present

## 2020-08-27 DIAGNOSIS — D259 Leiomyoma of uterus, unspecified: Secondary | ICD-10-CM | POA: Diagnosis not present

## 2020-08-28 ENCOUNTER — Other Ambulatory Visit: Payer: Self-pay

## 2020-08-28 DIAGNOSIS — N921 Excessive and frequent menstruation with irregular cycle: Secondary | ICD-10-CM | POA: Diagnosis not present

## 2020-08-30 DIAGNOSIS — F5089 Other specified eating disorder: Secondary | ICD-10-CM | POA: Diagnosis not present

## 2020-09-06 DIAGNOSIS — Z20822 Contact with and (suspected) exposure to covid-19: Secondary | ICD-10-CM | POA: Diagnosis not present

## 2020-09-12 ENCOUNTER — Encounter: Payer: Self-pay | Admitting: Medical

## 2020-09-12 ENCOUNTER — Other Ambulatory Visit: Payer: Self-pay

## 2020-09-12 ENCOUNTER — Ambulatory Visit (HOSPITAL_COMMUNITY)
Admission: RE | Admit: 2020-09-12 | Discharge: 2020-09-12 | Disposition: A | Discharge status: Home / Self Care | Payer: BC Managed Care – PPO | Source: Ambulatory Visit | Attending: Medical | Admitting: Medical

## 2020-09-12 ENCOUNTER — Telehealth: Payer: BC Managed Care – PPO | Admitting: Medical

## 2020-09-12 VITALS — Ht 61.0 in | Wt 202.0 lb

## 2020-09-12 DIAGNOSIS — R52 Pain, unspecified: Secondary | ICD-10-CM

## 2020-09-12 DIAGNOSIS — U071 COVID-19: Secondary | ICD-10-CM

## 2020-09-12 DIAGNOSIS — R0602 Shortness of breath: Secondary | ICD-10-CM

## 2020-09-12 NOTE — Progress Notes (Signed)
Subjective:     Patient ID: Laura Cross, female   DOB: 21-Jan-1986, 35 y.o.   MRN: [id number]  This visit type was conducted due to national recommendations for restrictions regarding the COVID-19 Pandemic (e.g. social distancing) in an effort to limit this patient's exposure and mitigate transmission in our community.  Due to their co-morbid illnesses, this patient is at least at moderate risk for complications without adequate follow up.  This format is felt to be most appropriate for this patient at this time.    Documentation for virtual audio and video telecommunications through [place] encounter:  The patient was located at home. The provider was located in the office. The patient did consent to this visit and is aware of possible charges through their insurance for this visit.  The other persons participating in this telemedicine service were none. Time spent on call was 20 minutes and in review of previous records 20 minutes total.  This virtual service is not related to other E/M service within previous 7 days.   HPI Chief Complaint  Patient presents with  . Covid Positive    Symptoms started last Thursday. Positive result last Saturday. Body ache, sob when standing, congestion, little cough. No covid vaccines    Virtual consult for illness, started with symptoms 09/07/20, tested positive for covid 09/07/20.  She notes body aches, congestion, some cough, phlegm, sinus pressure, headache, loss of taste and smell, some loose stool.   Gets winded quickly.  No nausea or vomiting.   Using tylenol, albuterol, using EmergenC vitamins.     No prior covid vaccine.    No other aggravating or relieving factors. No other complaint.  Past Medical History:  Diagnosis Date  . Family history of premature CAD    father  . Fibroids   . Impaired fasting blood sugar   . Miscarriage 2014  . Mixed dyslipidemia   . Ovarian cyst   . Sexually transmitted disease (STD)    hx/o HPV,  gonorrhea, chlamydia, genital herpes  . Syphilis 2010   Current Outpatient Medications on File Prior to Visit  Medication Sig Dispense Refill  . albuterol (VENTOLIN HFA) 108 (90 Base) MCG/ACT inhaler Inhale 2 puffs into the lungs every 6 (six) hours as needed for wheezing or shortness of breath. 18 g 0  . FEROSUL 325 (65 Fe) MG tablet Take 325 mg by mouth 2 (two) times daily.    . rosuvastatin (CRESTOR) 10 MG tablet Take 1 tablet (10 mg total) by mouth daily. 90 tablet 3  . valACYclovir (VALTREX) 1000 MG tablet 2 tablets po BID x 1 day for flare up 20 tablet 2   No current facility-administered medications on file prior to visit.     Review of Systems As in subjective    Objective:   Physical Exam Due to coronavirus pandemic stay at home measures, patient visit was virtual and they were not examined in person.   Ht 5\' 1"  (1.549 m)   Wt 202 lb (91.6 kg)   BMI 38.17 kg/m   Gen: wd, wn, nad, somewhat ill appearing, congested sounding No obvious sob or wheezing Answers questions in complete sentences without labored breathing     Assessment:     Encounter Diagnoses  Name Primary?  . COVID-19 virus infection Yes  . SOB (shortness of breath)   . Body aches        Plan:      Discussed symptoms, concerns.  She will go for xray.  We discussed the possibility of monoclonal antibody treatment.  We discussed that supply is limited currently so I cannot guarantee treatment.  We discussed the potential benefits of treatment.  Referral made   General recommendations if you have respiratory symptoms: We recommend you rest, hydrate well with water and clear fluids throughout the day such as water, soup broth, ice chips, or possibly Pedialyte or G2 no sugar gatorade.   You can use Tylenol over the counter for pain or fever every 4 - 6 hours You can use over the counter Delsym or mucinex DM for cough unless your provider prescribed a cough medication already. You can use over the  counter Emetrol over the counter for nausea.    Consider EmergenC Immune plus vitamin pack over the counter which contains extra vitamin C, vitamin D, and zinc.  If you are having trouble breathing, if you are very weak, have high fever 103 or higher consistently despite Tylenol, or uncontrollable nausea and vomiting, then call or go to the emergency department.    Covid symptoms such as fatigue and cough can linger over 2 weeks, even after the initial fever, aches, chills, and other initial symptoms.   Self Quarantine and Isolation: Log onto [employer] for up to date quarantine recommendations.   [email or web address].[email or web address]   If you test Covid +, regardless of vaccination status:  Stay home for 5 days. If you have no symptoms or your symptoms are resolving after 5 days, then you can leave your house. Continue to wear a mask around others for 5 additional days. If you have a fever, continue to stay home until your fever resolves.  This could take 7-10 days from onset of symptoms or longer in some cases. If you have lots of coughing, sneezing, and significant runny nose and congestion, continue to stay at home until your symptoms are resolving   If you were exposed to someone with Covid: If you: Have been boosted OR Completed the primary series of Pfizer or Moderna vaccine within the last 6 months OR Completed the primary series of J&J vaccine within the last 2 months  Wear a mask around others for 10 days.  Test on day 5, if possible.   If you test positive on day 5 or more after exposure, and no symptoms, then wear a mask around others for 10 days  If you test positive and have symptoms, then follow the positive covid result isolation recommendations above If you test negative and have no symptoms on day 5 after exposure, then you may end isolation and be around others    If you were exposed to someone with Covid: If  you: Completed the primary series of Pfizer or Moderna vaccine over 6 months ago and are not boosted OR Completed the primary series of J&J over 2 months ago and are not boosted OR Are unvaccinated  Stay home for 5 days. After that continue to wear a mask around others for 5 additional days. If you can't quarantine you must wear a mask for 10 days. Test on day 5 if possible. If you test positive on day 5 or more after exposure, and no symptoms, then wear a mask around others for 10 days  If you test positive and have symptoms, then follow the positive covid result isolation recommendations above If you test negative and have no symptoms on day 5 after exposure, then you can end isolation but wear a mask around others for 5 more days   If you  test Covid negative, but have respiratory symptoms:  Continue to wear a mask around others for 5 additional days. If you have a fever, continue to stay home until your fever resolves.   If you have lots of coughing, sneezing, and significant runny nose and congestion, continue to stay at home until your symptoms are resolving   Isolation means avoiding contact with people as much as possible.   Particularly in your house, isolate your self from others in a separate room, wear a mask when possible in the room, particularly if coughing a lot.   Have others bring food, water, medications, etc., to your door, but avoid direct contact with your household contacts during this time to avoid spreading the infection to them.   If you have a separate bathroom and living quarters during the next 2 weeks away from others, that would be preferable.    If you can't completely isolate, then wear a mask, wash hands frequently with soap and water for at least 15 seconds, minimize close contact with others, and have a friend or family member check regularly from a distance to make sure you are not getting seriously worse.     You should not be going out in public, should  not be going to stores, to work or other public places until all your symptoms have resolved.  One of the goals is to limit spread to high risk people; people that are older and elderly, people with multiple health issues like diabetes, heart disease, lung disease, and anybody that has weakened immune systems such as people with cancer or on immunosuppressive therapy.  Karmen was seen today for covid positive.  Diagnoses and all orders for this visit:  COVID-19 virus infection -     DG Chest 2 View; Future -     Ambulatory referral for Covid Treatment  SOB (shortness of breath) -     DG Chest 2 View; Future -     Ambulatory referral for Covid Treatment  Body aches -     DG Chest 2 View; Future -     Ambulatory referral for Covid Treatment  f/u pending xray

## 2020-09-13 ENCOUNTER — Encounter: Payer: Self-pay | Admitting: Infectious Diseases

## 2020-09-13 NOTE — Progress Notes (Signed)
She unfortunately is not eligable for the monoclonal antibodies.  She how she is feeling at this point

## 2020-09-13 NOTE — Progress Notes (Signed)
Reviewed referral - patient has been sick with COVID symptoms beyond timeline outlined in the Emergency Use Authorizations system for available acute COVID treatments.   For persistent symptoms could consider arranging her to be seen at the [facility] clinic [phone].   Will communicate with referring provider.   Janene Madeira, MSN, NP-C [facility] for Infectious Disease [email or web address] Pager: [phone] Office: [phone] [place]: [phone]

## 2020-09-13 NOTE — Progress Notes (Signed)
Sent message to patient via mychart.

## 2020-09-18 DIAGNOSIS — F331 Major depressive disorder, recurrent, moderate: Secondary | ICD-10-CM | POA: Diagnosis not present

## 2020-10-02 DIAGNOSIS — F509 Eating disorder, unspecified: Secondary | ICD-10-CM | POA: Diagnosis not present

## 2020-10-16 ENCOUNTER — Ambulatory Visit (INDEPENDENT_AMBULATORY_CARE_PROVIDER_SITE_OTHER): Payer: BC Managed Care – PPO | Admitting: Psychology

## 2020-10-16 DIAGNOSIS — F509 Eating disorder, unspecified: Secondary | ICD-10-CM

## 2020-11-12 ENCOUNTER — Ambulatory Visit (INDEPENDENT_AMBULATORY_CARE_PROVIDER_SITE_OTHER): Payer: BC Managed Care – PPO

## 2020-11-12 ENCOUNTER — Other Ambulatory Visit: Payer: Self-pay

## 2020-11-12 DIAGNOSIS — Z713 Dietary counseling and surveillance: Secondary | ICD-10-CM | POA: Diagnosis not present

## 2020-11-12 DIAGNOSIS — Z23 Encounter for immunization: Secondary | ICD-10-CM

## 2020-11-12 DIAGNOSIS — E282 Polycystic ovarian syndrome: Secondary | ICD-10-CM | POA: Diagnosis not present

## 2020-11-12 DIAGNOSIS — R7303 Prediabetes: Secondary | ICD-10-CM | POA: Diagnosis not present

## 2020-11-12 DIAGNOSIS — E7800 Pure hypercholesterolemia, unspecified: Secondary | ICD-10-CM | POA: Diagnosis not present

## 2020-12-03 ENCOUNTER — Ambulatory Visit: Payer: BC Managed Care – PPO

## 2020-12-05 ENCOUNTER — Encounter: Payer: BC Managed Care – PPO | Attending: General Surgery | Admitting: Skilled Nursing Facility1

## 2020-12-05 ENCOUNTER — Other Ambulatory Visit: Payer: Self-pay

## 2020-12-05 DIAGNOSIS — E669 Obesity, unspecified: Secondary | ICD-10-CM | POA: Diagnosis not present

## 2020-12-05 NOTE — Progress Notes (Signed)
Nutrition Assessment for Bariatric Surgery Medical Nutrition Therapy Appt Start Time: 2:40    End Time: 3:22  Patient was seen on 12/05/2020 for Pre-Operative Nutrition Assessment. Letter of approval faxed to [facility] bariatric surgery program coordinator on 12/05/2020.   Referral stated Supervised Weight Loss (SWL) visits needed: 0  Pt completed visits.   Pt has cleared nutrition requirements.    Planned surgery: Sleeve gastrectomy  Pt expectation of surgery: to lose weight Pt expectation of dietitian: none stated    NUTRITION ASSESSMENT   Anthropometrics  Start weight at [facility]: 204 lbs (date: 12/05/2020)  Height: 60 in BMI: 39.84 kg/m2     Clinical  Medical hx: prediabetes, PCOS Medications: Iron supplement  Labs: A1C 6.0 Notable signs/symptoms: none stated Any previous deficiencies? iron  Micronutrient Nutrition Focused Physical Exam: Hair: No issues observed Eyes: No issues observed Mouth: No issues observed Neck: No issues observed Nails: No issues observed Skin: No issues observed  Lifestyle & Dietary Hx  Pt states she was seeing a therapist but is not currently stating she does have intentions of picking it back up. Pt states with her therapist she was working on emotionally eating and not feeling like she has to clear her plate. Pt states she has cut out grazing and has been planning her meals. Pt states she has been tracking her foods as well which she finds helpful. Pt state she really feels like counseling helped her to understand how and what to eat in a different.  Pt states she does not currently have a physical activity routine.   Pt states after surgery she may find getting in enough protein and water a little tougher.   24-Hr Dietary Recall First Meal: 2 bacon + 2 eggs + 1 mini waffels Snack: meats and cheeses Second Meal: skipped or salmon + pasta Snack: applesauce  Third Meal: salmon + pasta Snack:  Beverages: water, protein  shake, diet green tea   Estimated Energy Needs Calories: 1300  NUTRITION DIAGNOSIS  Overweight/obesity ([place]-3.3) related to past poor dietary habits and physical inactivity as evidenced by patient w/ planned sleeve gastrectomy surgery following dietary guidelines for continued weight loss.    NUTRITION INTERVENTION  Nutrition counseling (C-1) and education (E-2) to facilitate bariatric surgery goals.   Pre-Op Goals Reviewed with the Patient . Track food and beverage intake (pen and paper, MyFitness Pal, Baritastic app, etc.) . Make healthy food choices while monitoring portion sizes . Consume 3 meals per day or try to eat every 3-5 hours . Avoid concentrated sugars and fried foods . Keep sugar & fat in the single digits per serving on food labels . Practice CHEWING your food (aim for applesauce consistency) . Practice not drinking 15 minutes before, during, and 30 minutes after each meal and snack . Avoid all carbonated beverages (ex: soda, sparkling beverages)  . Limit caffeinated beverages (ex: coffee, tea, energy drinks) . Avoid all sugar-sweetened beverages (ex: regular soda, sports drinks)  . Avoid alcohol  . Aim for 64-100 ounces of FLUID daily (with at least half of fluid intake being plain water)  . Aim for at least 60-80 grams of PROTEIN daily . Look for a liquid protein source that contains ?15 g protein and ?5 g carbohydrate (ex: shakes, drinks, shots) . Make a list of non-food related activities . Physical activity is an important part of a healthy lifestyle so keep it moving! The goal is to reach 150 minutes of exercise per week, including cardiovascular and weight baring  activity.  *Goals that are bolded indicate the pt would like to start working towards these  Handouts Provided Include  . Bariatric Surgery handouts (Nutrition Visits, Pre-Op Goals, Protein Shakes, Vitamins & Minerals)  Learning Style & Readiness for Change Teaching method utilized: Visual & Auditory   Demonstrated degree of understanding via: Teach Back  Readiness Level: Action  Barriers to learning/adherence to lifestyle change: none identified      MONITORING & EVALUATION Dietary intake, weekly physical activity, body weight, and pre-op goals reached at next nutrition visit.    Next Steps  Patient is to follow up at [facility] for Pre-Op Class >2 weeks before surgery for further nutrition education.   Pt has completed visits. No further supervised visits required/recomended

## 2020-12-06 DIAGNOSIS — R7303 Prediabetes: Secondary | ICD-10-CM | POA: Diagnosis not present

## 2020-12-06 DIAGNOSIS — E282 Polycystic ovarian syndrome: Secondary | ICD-10-CM | POA: Diagnosis not present

## 2020-12-06 DIAGNOSIS — E7800 Pure hypercholesterolemia, unspecified: Secondary | ICD-10-CM | POA: Diagnosis not present

## 2020-12-10 ENCOUNTER — Other Ambulatory Visit: Payer: Self-pay

## 2020-12-10 ENCOUNTER — Ambulatory Visit (INDEPENDENT_AMBULATORY_CARE_PROVIDER_SITE_OTHER): Payer: BC Managed Care – PPO

## 2020-12-10 DIAGNOSIS — Z23 Encounter for immunization: Secondary | ICD-10-CM | POA: Diagnosis not present

## 2020-12-13 ENCOUNTER — Ambulatory Visit: Payer: Self-pay | Admitting: General Surgery

## 2021-01-08 ENCOUNTER — Ambulatory Visit (INDEPENDENT_AMBULATORY_CARE_PROVIDER_SITE_OTHER): Payer: BC Managed Care – PPO | Admitting: Psychology

## 2021-01-08 DIAGNOSIS — F509 Eating disorder, unspecified: Secondary | ICD-10-CM

## 2021-01-20 ENCOUNTER — Other Ambulatory Visit: Payer: Self-pay

## 2021-01-20 ENCOUNTER — Other Ambulatory Visit (HOSPITAL_COMMUNITY): Payer: Self-pay

## 2021-01-20 ENCOUNTER — Encounter: Payer: BC Managed Care – PPO | Attending: General Surgery | Admitting: Skilled Nursing Facility1

## 2021-01-20 DIAGNOSIS — E669 Obesity, unspecified: Secondary | ICD-10-CM | POA: Insufficient documentation

## 2021-01-20 NOTE — Progress Notes (Signed)
Pre-Operative Nutrition Class:    Patient was seen on 01/20/2021 for Pre-Operative Bariatric Surgery Education [facility]    Surgery date: 02/04/2021 Surgery type: sleeve Start weight at [facility]: 204 Weight today: 205.8  Samples given per MNT protocol. Patient educated on appropriate usage: Bariatric advantage Vitamins Multivitamin Lot # [id number] Exp: 08/2021   procare Vitamins Calcium Lot # [id number] Exp: 01/23    Protein  Shake Lot #  [id number] Exp:  11/2021  The following the learning objectives were met by the patient during this course: Identify Pre-Op Dietary Goals and will begin 2 weeks pre-operatively Identify appropriate sources of fluids and proteins  State protein recommendations and appropriate sources pre and post-operatively Identify Post-Operative Dietary Goals and will follow for 2 weeks post-operatively Identify appropriate multivitamin and calcium sources Describe the need for physical activity post-operatively and will follow MD recommendations State when to call healthcare provider regarding medication questions or post-operative complications When having a diagnosis of diabetes understanding hypoglycemia symptoms and the inclusion of 1 complex carbohydrate per meal  Handouts given during class include: Pre-Op Bariatric Surgery Diet Handout Protein Shake Handout Post-Op Bariatric Surgery Nutrition Handout BELT Program Information Flyer Support Group Information Flyer [facility] Bariatric Supplements Price List  Follow-Up Plan: Patient will follow-up at [facility] 2 weeks post operatively for diet advancement per MD.

## 2021-01-23 NOTE — Patient Instructions (Addendum)
DUE TO COVID-19 ONLY ONE VISITOR IS ALLOWED TO COME WITH YOU AND STAY IN THE WAITING ROOM ONLY DURING PRE OP AND PROCEDURE DAY OF SURGERY. THE 1 VISITOR  MAY VISIT WITH YOU AFTER SURGERY IN YOUR PRIVATE ROOM DURING VISITING HOURS ONLY!  YOU NEED TO HAVE A COVID 19 TEST ON: 6/27//22 @ 9:00 AM, THIS TEST MUST BE DONE BEFORE SURGERY,  COVID TESTING SITE [place] [id number], IT IS ON THE RIGHT GOING OUT [address] APPROXIMATELY  2 MINUTES PAST [employer] ON THE RIGHT. ONCE YOUR COVID TEST IS COMPLETED,  PLEASE BEGIN THE QUARANTINE INSTRUCTIONS AS OUTLINED IN YOUR HANDOUT.                Laura Cross   Your procedure is scheduled on: 02/04/21   Report to [facility] Main  Entrance   Report to admitting at: 8:45 AM     Call this number if you have problems the morning of surgery [phone]    Remember: MORNING OF SURGERY DRINK:   DRINK 1 G2 drink BEFORE YOU LEAVE HOME, DRINK ALL OF THE  G2 DRINK AT ONE TIME.   NO SOLID FOOD AFTER 600 PM THE NIGHT BEFORE YOUR SURGERY. YOU MAY DRINK CLEAR FLUIDS. THE G2 DRINK YOU DRINK BEFORE YOU LEAVE HOME WILL BE THE LAST FLUIDS YOU DRINK BEFORE SURGERY ( AT: 7:45 AM).  PAIN IS EXPECTED AFTER SURGERY AND WILL NOT BE COMPLETELY ELIMINATED. AMBULATION AND TYLENOL WILL HELP REDUCE INCISIONAL AND GAS PAIN. MOVEMENT IS KEY!  YOU ARE EXPECTED TO BE OUT OF BED WITHIN 4 HOURS OF ADMISSION TO YOUR PATIENT ROOM.  SITTING IN THE RECLINER THROUGHOUT THE DAY IS IMPORTANT FOR DRINKING FLUIDS AND MOVING GAS THROUGHOUT THE GI TRACT.  COMPRESSION STOCKINGS SHOULD BE WORN [place] UNLESS YOU ARE WALKING.   INCENTIVE SPIROMETER SHOULD BE USED EVERY HOUR WHILE AWAKE TO DECREASE POST-OPERATIVE COMPLICATIONS SUCH AS PNEUMONIA.  WHEN DISCHARGED HOME, IT IS IMPORTANT TO CONTINUE TO WALK EVERY HOUR AND USE THE INCENTIVE SPIROMETER EVERY HOUR.   CLEAR LIQUID DIET  Foods Allowed                                                                      Foods Excluded  Coffee and tea, regular and decaf                             liquids that you cannot  Plain Jell-O any favor except red or purple                                           see through such as: Fruit ices (not with fruit pulp)                                     milk, soups, orange juice  Iced Popsicles  All solid food Carbonated beverages, regular and diet                                    Cranberry, grape and apple juices Sports drinks like Gatorade Lightly seasoned clear broth or consume(fat free) Sugar, honey syrup  Sample Menu Breakfast                                Lunch                                     Supper Cranberry juice                    Beef broth                            Chicken broth Jell-O                                     Grape juice                           Apple juice Coffee or tea                        Jell-O                                      Popsicle                                                Coffee or tea                        Coffee or tea  _____________________________________________________________________   BRUSH YOUR TEETH MORNING OF SURGERY AND RINSE YOUR MOUTH OUT, NO CHEWING GUM CANDY OR MINTS.    Take these medicines the morning of surgery with A SIP OF WATER: Valacyclovir as needed.                               You may not have any metal on your body including hair pins and              piercings  Do not wear jewelry, make-up, lotions, powders or perfumes, deodorant             Do not wear nail polish on your fingernails.  Do not shave  48 hours prior to surgery.    Do not bring valuables to the hospital. [place].  Contacts, dentures or bridgework may not be worn into surgery.  Leave suitcase in the car. After surgery it may be brought to your room.     Patients discharged the day of surgery  will not be allowed to drive home. IF YOU ARE HAVING SURGERY AND GOING HOME THE SAME DAY, YOU MUST HAVE AN ADULT TO DRIVE YOU HOME AND BE WITH YOU FOR 24 HOURS. YOU MAY GO HOME BY TAXI OR UBER OR ORTHERWISE, BUT AN ADULT MUST ACCOMPANY YOU HOME AND STAY WITH YOU FOR 24 HOURS.  Name and phone number of your driver:  Special Instructions: N/A              Please read over the following fact sheets you were given: _____________________________________________________________________           [facility] - Preparing for Surgery Before surgery, you can play an important role.  Because skin is not sterile, your skin needs to be as free of germs as possible.  You can reduce the number of germs on your skin by washing with CHG (chlorahexidine gluconate) soap before surgery.  CHG is an antiseptic cleaner which kills germs and bonds with the skin to continue killing germs even after washing. Please DO NOT use if you have an allergy to CHG or antibacterial soaps.  If your skin becomes reddened/irritated stop using the CHG and inform your nurse when you arrive at Short Stay. Do not shave (including legs and underarms) for at least 48 hours prior to the first CHG shower.  You may shave your face/neck. Please follow these instructions carefully:  1.  Shower with CHG Soap the night before surgery and the  morning of Surgery.  2.  If you choose to wash your hair, wash your hair first as usual with your  normal  shampoo.  3.  After you shampoo, rinse your hair and body thoroughly to remove the  shampoo.                           4.  Use CHG as you would any other liquid soap.  You can apply chg directly  to the skin and wash                       Gently with a scrungie or clean washcloth.  5.  Apply the CHG Soap to your body ONLY FROM THE NECK DOWN.   Do not use on face/ open                           Wound or open sores. Avoid contact with eyes, ears mouth and genitals (private parts).                       Wash  face,  Genitals (private parts) with your normal soap.             6.  Wash thoroughly, paying special attention to the area where your surgery  will be performed.  7.  Thoroughly rinse your body with warm water from the neck down.  8.  DO NOT shower/wash with your normal soap after using and rinsing off  the CHG Soap.                9.  Pat yourself dry with a clean towel.            10.  Wear clean pajamas.            11.  Place clean sheets on your bed the night of your first shower and do not  sleep with pets. Day of Surgery : Do not apply any lotions/deodorants the morning of surgery.  Please wear clean clothes to the hospital/surgery center.  FAILURE TO FOLLOW THESE INSTRUCTIONS MAY RESULT IN THE CANCELLATION OF YOUR SURGERY PATIENT SIGNATURE_________________________________  NURSE SIGNATURE__________________________________  ________________________________________________________________________

## 2021-01-24 ENCOUNTER — Other Ambulatory Visit: Payer: Self-pay

## 2021-01-24 ENCOUNTER — Encounter (HOSPITAL_COMMUNITY): Payer: Self-pay

## 2021-01-24 ENCOUNTER — Encounter (HOSPITAL_COMMUNITY)
Admission: RE | Admit: 2021-01-24 | Discharge: 2021-01-24 | Disposition: A | Discharge status: Home / Self Care | Payer: BC Managed Care – PPO | Source: Ambulatory Visit | Attending: General Surgery | Admitting: General Surgery

## 2021-01-24 DIAGNOSIS — Z01812 Encounter for preprocedural laboratory examination: Secondary | ICD-10-CM | POA: Diagnosis not present

## 2021-01-24 HISTORY — DX: Anemia, unspecified: D64.9

## 2021-01-24 HISTORY — DX: Prediabetes: R73.03

## 2021-01-24 HISTORY — DX: Abnormal levels of other serum enzymes: R74.8

## 2021-01-24 LAB — CBC WITH DIFFERENTIAL/PLATELET
Abs Immature Granulocytes: 0.01 10*3/uL (ref 0.00–0.07)
Basophils Absolute: 0 10*3/uL (ref 0.0–0.1)
Basophils Relative: 1 %
Eosinophils Absolute: 0.1 10*3/uL (ref 0.0–0.5)
Eosinophils Relative: 1 %
HCT: 35.8 % — ABNORMAL LOW (ref 36.0–46.0)
Hemoglobin: 10.8 g/dL — ABNORMAL LOW (ref 12.0–15.0)
Immature Granulocytes: 0 %
Lymphocytes Relative: 36 %
Lymphs Abs: 1.4 10*3/uL (ref 0.7–4.0)
MCH: 24.8 pg — ABNORMAL LOW (ref 26.0–34.0)
MCHC: 30.2 g/dL (ref 30.0–36.0)
MCV: 82.1 fL (ref 80.0–100.0)
Monocytes Absolute: 0.4 10*3/uL (ref 0.1–1.0)
Monocytes Relative: 9 %
Neutro Abs: 2.2 10*3/uL (ref 1.7–7.7)
Neutrophils Relative %: 53 %
Platelets: 317 10*3/uL (ref 150–400)
RBC: 4.36 MIL/uL (ref 3.87–5.11)
RDW: 14.6 % (ref 11.5–15.5)
WBC: 4.1 10*3/uL (ref 4.0–10.5)
nRBC: 0 % (ref 0.0–0.2)

## 2021-01-24 LAB — COMPREHENSIVE METABOLIC PANEL
ALT: 38 U/L (ref 0–44)
AST: 25 U/L (ref 15–41)
Albumin: 4.1 g/dL (ref 3.5–5.0)
Alkaline Phosphatase: 59 U/L (ref 38–126)
Anion gap: 7 (ref 5–15)
BUN: 15 mg/dL (ref 6–20)
CO2: 27 mmol/L (ref 22–32)
Calcium: 9.8 mg/dL (ref 8.9–10.3)
Chloride: 104 mmol/L (ref 98–111)
Creatinine, Ser: 0.85 mg/dL (ref 0.44–1.00)
GFR, Estimated: >60 mL/min (ref >60)
Glucose, Bld: 78 mg/dL (ref 70–99)
Potassium: 3.9 mmol/L (ref 3.5–5.1)
Sodium: 138 mmol/L (ref 135–145)
Total Bilirubin: 0.5 mg/dL (ref 0.3–1.2)
Total Protein: 7.6 g/dL (ref 6.5–8.1)

## 2021-01-24 LAB — TYPE AND SCREEN
ABO/RH(D): O POS
Antibody Screen: NEGATIVE

## 2021-01-24 NOTE — Progress Notes (Signed)
COVID Vaccine Completed: Yes Date COVID Vaccine completed: 12/10/20 COVID vaccine manufacturer: [employer]      PCP - Shanon Brow Tysinger: PA-C Cardiologist -   Chest x-ray -  EKG -  Stress Test -  ECHO -  Cardiac Cath -  Pacemaker/ICD device last checked:  Sleep Study -  CPAP -   Fasting Blood Sugar -  Checks Blood Sugar _____ times a day  Blood Thinner Instructions: Aspirin Instructions: Last Dose:  Anesthesia review: Hx: Pre-DIA  Patient denies shortness of breath, fever, cough and chest pain at PAT appointment   Patient verbalized understanding of instructions that were given to them at the PAT appointment. Patient was also instructed that they will need to review over the PAT instructions again at home before surgery.

## 2021-01-25 LAB — HEMOGLOBIN A1C
Hgb A1c MFr Bld: 5.8 % — ABNORMAL HIGH (ref 4.8–5.6)
Mean Plasma Glucose: 120 mg/dL

## 2021-02-03 ENCOUNTER — Other Ambulatory Visit (HOSPITAL_COMMUNITY)
Admission: RE | Admit: 2021-02-03 | Discharge: 2021-02-03 | Disposition: A | Discharge status: Home / Self Care | Payer: BC Managed Care – PPO | Source: Ambulatory Visit | Attending: General Surgery | Admitting: General Surgery

## 2021-02-03 DIAGNOSIS — Z6839 Body mass index (BMI) 39.0-39.9, adult: Secondary | ICD-10-CM | POA: Diagnosis not present

## 2021-02-03 DIAGNOSIS — Z8249 Family history of ischemic heart disease and other diseases of the circulatory system: Secondary | ICD-10-CM | POA: Diagnosis not present

## 2021-02-03 DIAGNOSIS — Z823 Family history of stroke: Secondary | ICD-10-CM | POA: Diagnosis not present

## 2021-02-03 DIAGNOSIS — R7303 Prediabetes: Secondary | ICD-10-CM | POA: Diagnosis not present

## 2021-02-03 DIAGNOSIS — Z6838 Body mass index (BMI) 38.0-38.9, adult: Secondary | ICD-10-CM | POA: Diagnosis not present

## 2021-02-03 DIAGNOSIS — Z20822 Contact with and (suspected) exposure to covid-19: Secondary | ICD-10-CM | POA: Diagnosis not present

## 2021-02-03 DIAGNOSIS — Z01812 Encounter for preprocedural laboratory examination: Secondary | ICD-10-CM | POA: Insufficient documentation

## 2021-02-03 DIAGNOSIS — Z833 Family history of diabetes mellitus: Secondary | ICD-10-CM | POA: Diagnosis not present

## 2021-02-03 DIAGNOSIS — E782 Mixed hyperlipidemia: Secondary | ICD-10-CM | POA: Diagnosis not present

## 2021-02-03 DIAGNOSIS — Z825 Family history of asthma and other chronic lower respiratory diseases: Secondary | ICD-10-CM | POA: Diagnosis not present

## 2021-02-03 LAB — SARS CORONAVIRUS 2 (TAT 6-24 HRS): SARS Coronavirus 2: NEGATIVE

## 2021-02-03 MED ORDER — BUPIVACAINE LIPOSOME 1.3 % IJ SUSP
20.0000 mL | Freq: Once | INTRAMUSCULAR | Status: DC
  Filled 2021-02-03: qty 20

## 2021-02-03 MED ORDER — SODIUM CHLORIDE 0.9 % IV SOLN
2.0000 g | INTRAVENOUS | Status: AC
  Administered 2021-02-04: 100 g via INTRAVENOUS
  Filled 2021-02-03: qty 2

## 2021-02-03 NOTE — Anesthesia Preprocedure Evaluation (Addendum)
Anesthesia Evaluation  Patient identified by MRN, date of birth, ID band Patient awake    Reviewed: Allergy & Precautions, NPO status , Patient's Chart, lab work & pertinent test results  Airway Mallampati: II  TM Distance: >3 FB Neck ROM: Full    Dental no notable dental hx. (+) Teeth Intact, Dental Advisory Given   Pulmonary neg pulmonary ROS,    Pulmonary exam normal breath sounds clear to auscultation       Cardiovascular negative cardio ROS Normal cardiovascular exam Rhythm:Regular Rate:Normal     Neuro/Psych negative neurological ROS  negative psych ROS   GI/Hepatic negative GI ROS, Neg liver ROS,   Endo/Other  diabetes (pre-diabetic)Obesity BMI 38  Renal/GU negative Renal ROS  negative genitourinary   Musculoskeletal negative musculoskeletal ROS (+)   Abdominal (+) + obese,   Peds  Hematology  (+) Blood dyscrasia, anemia , hct 35.8   Anesthesia Other Findings   Reproductive/Obstetrics negative OB ROS                            Anesthesia Physical Anesthesia Plan  ASA: 2  Anesthesia Plan: General   Post-op Pain Management:    Induction: Intravenous  PONV Risk Score and Plan: 4 or greater and Ondansetron, Aprepitant, Dexamethasone, Midazolam, Treatment may vary due to age or medical condition and Scopolamine patch - Pre-op  Airway Management Planned: Oral ETT  Additional Equipment: None  Intra-op Plan:   Post-operative Plan: Extubation in OR  Informed Consent: I have reviewed the patients History and Physical, chart, labs and discussed the procedure including the risks, benefits and alternatives for the proposed anesthesia with the patient or authorized representative who has indicated his/her understanding and acceptance.     Dental advisory given  Plan Discussed with: CRNA  Anesthesia Plan Comments:        Anesthesia Quick Evaluation

## 2021-02-04 ENCOUNTER — Inpatient Hospital Stay (HOSPITAL_COMMUNITY)
Admission: RE | Admit: 2021-02-04 | Discharge: 2021-02-05 | DRG: 621 | Disposition: A | Discharge status: Home / Self Care | Payer: BC Managed Care – PPO | Attending: General Surgery | Admitting: General Surgery

## 2021-02-04 ENCOUNTER — Inpatient Hospital Stay (HOSPITAL_COMMUNITY): Payer: BC Managed Care – PPO | Admitting: Certified Registered Nurse Anesthetist

## 2021-02-04 ENCOUNTER — Encounter (HOSPITAL_COMMUNITY): Payer: Self-pay | Admitting: General Surgery

## 2021-02-04 ENCOUNTER — Encounter (HOSPITAL_COMMUNITY)
Admission: RE | Disposition: A | Discharge status: Home / Self Care | Payer: Self-pay | Source: Home / Self Care | Attending: General Surgery

## 2021-02-04 ENCOUNTER — Other Ambulatory Visit: Payer: Self-pay

## 2021-02-04 DIAGNOSIS — Z8249 Family history of ischemic heart disease and other diseases of the circulatory system: Secondary | ICD-10-CM

## 2021-02-04 DIAGNOSIS — Z823 Family history of stroke: Secondary | ICD-10-CM | POA: Diagnosis not present

## 2021-02-04 DIAGNOSIS — Z20822 Contact with and (suspected) exposure to covid-19: Secondary | ICD-10-CM | POA: Diagnosis present

## 2021-02-04 DIAGNOSIS — R7303 Prediabetes: Secondary | ICD-10-CM | POA: Diagnosis present

## 2021-02-04 DIAGNOSIS — Z825 Family history of asthma and other chronic lower respiratory diseases: Secondary | ICD-10-CM | POA: Diagnosis not present

## 2021-02-04 DIAGNOSIS — Z9884 Bariatric surgery status: Secondary | ICD-10-CM

## 2021-02-04 DIAGNOSIS — Z833 Family history of diabetes mellitus: Secondary | ICD-10-CM | POA: Diagnosis not present

## 2021-02-04 DIAGNOSIS — E782 Mixed hyperlipidemia: Secondary | ICD-10-CM | POA: Diagnosis present

## 2021-02-04 DIAGNOSIS — E785 Hyperlipidemia, unspecified: Secondary | ICD-10-CM | POA: Diagnosis present

## 2021-02-04 DIAGNOSIS — Z6839 Body mass index (BMI) 39.0-39.9, adult: Secondary | ICD-10-CM | POA: Diagnosis not present

## 2021-02-04 HISTORY — PX: UPPER GI ENDOSCOPY: SHX6162

## 2021-02-04 HISTORY — PX: GASTRIC BYPASS: SHX52

## 2021-02-04 LAB — PREGNANCY, URINE: Preg Test, Ur: NEGATIVE

## 2021-02-04 LAB — HEMOGLOBIN AND HEMATOCRIT, BLOOD
HCT: 37.6 % (ref 36.0–46.0)
Hemoglobin: 11.4 g/dL — ABNORMAL LOW (ref 12.0–15.0)

## 2021-02-04 SURGERY — GASTRECTOMY, SLEEVE, ROBOT-ASSISTED
Anesthesia: General

## 2021-02-04 MED ORDER — CHLORHEXIDINE GLUCONATE 4 % EX LIQD: 60.0000 mL | Freq: Once | CUTANEOUS | Status: DC

## 2021-02-04 MED ORDER — AMISULPRIDE (ANTIEMETIC) 5 MG/2ML IV SOLN: 10.0000 mg | Freq: Once | INTRAVENOUS | Status: DC | PRN

## 2021-02-04 MED ORDER — SODIUM CHLORIDE 0.9% FLUSH
INTRAVENOUS | Status: DC | PRN
  Administered 2021-02-04: 50 mL

## 2021-02-04 MED ORDER — ARTIFICIAL TEARS OPHTHALMIC OINT
TOPICAL_OINTMENT | OPHTHALMIC | Status: AC
  Filled 2021-02-04: qty 3.5

## 2021-02-04 MED ORDER — HYDRALAZINE HCL 20 MG/ML IJ SOLN: 10.0000 mg | INTRAMUSCULAR | Status: DC | PRN

## 2021-02-04 MED ORDER — PANTOPRAZOLE SODIUM 40 MG IV SOLR
40.0000 mg | Freq: Every day | INTRAVENOUS | Status: DC
  Administered 2021-02-04: 40 mg via INTRAVENOUS
  Filled 2021-02-04: qty 40

## 2021-02-04 MED ORDER — ACETAMINOPHEN 500 MG PO TABS
1000.0000 mg | ORAL_TABLET | ORAL | Status: AC
  Administered 2021-02-04: 1000 mg via ORAL
  Filled 2021-02-04: qty 2

## 2021-02-04 MED ORDER — EPHEDRINE 5 MG/ML INJ
INTRAVENOUS | Status: AC
  Filled 2021-02-04: qty 10

## 2021-02-04 MED ORDER — ACETAMINOPHEN 500 MG PO TABS
1000.0000 mg | ORAL_TABLET | Freq: Three times a day (TID) | ORAL | Status: DC
  Administered 2021-02-04 – 2021-02-05 (×3): 1000 mg via ORAL
  Filled 2021-02-04 (×3): qty 2

## 2021-02-04 MED ORDER — LIDOCAINE 2% (20 MG/ML) 5 ML SYRINGE
INTRAMUSCULAR | Status: DC | PRN
  Administered 2021-02-04: 60 mg via INTRAVENOUS

## 2021-02-04 MED ORDER — KCL IN DEXTROSE-NACL 20-5-0.45 MEQ/L-%-% IV SOLN
INTRAVENOUS | Status: DC
  Filled 2021-02-04 (×4): qty 1000

## 2021-02-04 MED ORDER — GABAPENTIN 300 MG PO CAPS
300.0000 mg | ORAL_CAPSULE | ORAL | Status: AC
  Administered 2021-02-04: 300 mg via ORAL
  Filled 2021-02-04: qty 1

## 2021-02-04 MED ORDER — PROPOFOL 10 MG/ML IV BOLUS
INTRAVENOUS | Status: AC
  Filled 2021-02-04: qty 20

## 2021-02-04 MED ORDER — PROPOFOL 10 MG/ML IV BOLUS
INTRAVENOUS | Status: DC | PRN
  Administered 2021-02-04: 200 mg via INTRAVENOUS

## 2021-02-04 MED ORDER — SCOPOLAMINE 1 MG/3DAYS TD PT72
1.0000 | MEDICATED_PATCH | TRANSDERMAL | Status: DC
  Administered 2021-02-04: 1.5 mg via TRANSDERMAL
  Filled 2021-02-04: qty 1

## 2021-02-04 MED ORDER — PHENYLEPHRINE HCL (PRESSORS) 10 MG/ML IV SOLN
INTRAVENOUS | Status: AC
  Filled 2021-02-04: qty 1

## 2021-02-04 MED ORDER — ROCURONIUM BROMIDE 10 MG/ML (PF) SYRINGE
PREFILLED_SYRINGE | INTRAVENOUS | Status: DC | PRN
  Administered 2021-02-04: 100 mg via INTRAVENOUS

## 2021-02-04 MED ORDER — ONDANSETRON HCL 4 MG/2ML IJ SOLN
INTRAMUSCULAR | Status: DC | PRN
  Administered 2021-02-04: 4 mg via INTRAVENOUS

## 2021-02-04 MED ORDER — OXYCODONE HCL 5 MG/5ML PO SOLN
5.0000 mg | Freq: Four times a day (QID) | ORAL | Status: DC | PRN
  Administered 2021-02-04 – 2021-02-05 (×3): 5 mg via ORAL
  Filled 2021-02-04 (×4): qty 5

## 2021-02-04 MED ORDER — APREPITANT 40 MG PO CAPS
40.0000 mg | ORAL_CAPSULE | ORAL | Status: AC
  Administered 2021-02-04: 40 mg via ORAL
  Filled 2021-02-04: qty 1

## 2021-02-04 MED ORDER — DIPHENHYDRAMINE HCL 50 MG/ML IJ SOLN: 12.5000 mg | Freq: Three times a day (TID) | INTRAMUSCULAR | Status: DC | PRN

## 2021-02-04 MED ORDER — DEXAMETHASONE SODIUM PHOSPHATE 10 MG/ML IJ SOLN
INTRAMUSCULAR | Status: AC
  Filled 2021-02-04: qty 1

## 2021-02-04 MED ORDER — PHENYLEPHRINE 40 MCG/ML (10ML) SYRINGE FOR IV PUSH (FOR BLOOD PRESSURE SUPPORT)
PREFILLED_SYRINGE | INTRAVENOUS | Status: AC
  Filled 2021-02-04: qty 10

## 2021-02-04 MED ORDER — OXYCODONE HCL 5 MG PO TABS: 5.0000 mg | ORAL_TABLET | Freq: Once | ORAL | Status: DC | PRN

## 2021-02-04 MED ORDER — ONDANSETRON HCL 4 MG/2ML IJ SOLN
INTRAMUSCULAR | Status: AC
  Filled 2021-02-04: qty 2

## 2021-02-04 MED ORDER — FENTANYL CITRATE (PF) 250 MCG/5ML IJ SOLN
INTRAMUSCULAR | Status: AC
  Filled 2021-02-04: qty 5

## 2021-02-04 MED ORDER — LACTATED RINGERS IV SOLN
INTRAVENOUS | Status: AC | PRN
  Administered 2021-02-04: 1000 mL

## 2021-02-04 MED ORDER — MORPHINE SULFATE (PF) 2 MG/ML IV SOLN
1.0000 mg | INTRAVENOUS | Status: DC | PRN
Start: 2021-02-04 — End: 2021-02-05
  Administered 2021-02-04: 2 mg via INTRAVENOUS
  Administered 2021-02-05: 1 mg via INTRAVENOUS
  Filled 2021-02-04 (×3): qty 1

## 2021-02-04 MED ORDER — FENTANYL CITRATE (PF) 100 MCG/2ML IJ SOLN
INTRAMUSCULAR | Status: DC | PRN
  Administered 2021-02-04: 100 ug via INTRAVENOUS
  Administered 2021-02-04: 50 ug via INTRAVENOUS
  Administered 2021-02-04: 100 ug via INTRAVENOUS

## 2021-02-04 MED ORDER — MIDAZOLAM HCL 2 MG/2ML IJ SOLN
INTRAMUSCULAR | Status: AC
  Filled 2021-02-04: qty 2

## 2021-02-04 MED ORDER — ROCURONIUM BROMIDE 10 MG/ML (PF) SYRINGE
PREFILLED_SYRINGE | INTRAVENOUS | Status: AC
  Filled 2021-02-04: qty 10

## 2021-02-04 MED ORDER — BUPIVACAINE LIPOSOME 1.3 % IJ SUSP
INTRAMUSCULAR | Status: DC | PRN
  Administered 2021-02-04: 20 mL

## 2021-02-04 MED ORDER — MEPERIDINE HCL 50 MG/ML IJ SOLN: 6.2500 mg | INTRAMUSCULAR | Status: DC | PRN

## 2021-02-04 MED ORDER — ACETAMINOPHEN 160 MG/5ML PO SOLN: 1000.0000 mg | Freq: Three times a day (TID) | ORAL | Status: DC

## 2021-02-04 MED ORDER — DEXAMETHASONE SODIUM PHOSPHATE 4 MG/ML IJ SOLN
4.0000 mg | INTRAMUSCULAR | Status: AC
  Administered 2021-02-04: 4 mg via INTRAVENOUS

## 2021-02-04 MED ORDER — LACTATED RINGERS IV SOLN: INTRAVENOUS | Status: DC

## 2021-02-04 MED ORDER — HYDROMORPHONE HCL 1 MG/ML IJ SOLN: 0.2500 mg | INTRAMUSCULAR | Status: DC | PRN

## 2021-02-04 MED ORDER — PROMETHAZINE HCL 25 MG/ML IJ SOLN: 6.2500 mg | INTRAMUSCULAR | Status: DC | PRN

## 2021-02-04 MED ORDER — PHENYLEPHRINE 40 MCG/ML (10ML) SYRINGE FOR IV PUSH (FOR BLOOD PRESSURE SUPPORT)
PREFILLED_SYRINGE | INTRAVENOUS | Status: DC | PRN
  Administered 2021-02-04 (×3): 120 ug via INTRAVENOUS

## 2021-02-04 MED ORDER — SUGAMMADEX SODIUM 200 MG/2ML IV SOLN
INTRAVENOUS | Status: DC | PRN
  Administered 2021-02-04: 200 mg via INTRAVENOUS

## 2021-02-04 MED ORDER — CHLORHEXIDINE GLUCONATE 0.12 % MT SOLN: 15.0000 mL | Freq: Once | OROMUCOSAL | Status: AC

## 2021-02-04 MED ORDER — SODIUM CHLORIDE 0.9 % IV SOLN
12.5000 mg | Freq: Four times a day (QID) | INTRAVENOUS | Status: DC | PRN
  Filled 2021-02-04: qty 0.5

## 2021-02-04 MED ORDER — ENOXAPARIN SODIUM 30 MG/0.3ML IJ SOSY
30.0000 mg | PREFILLED_SYRINGE | Freq: Two times a day (BID) | INTRAMUSCULAR | Status: DC
  Administered 2021-02-05: 30 mg via SUBCUTANEOUS
  Filled 2021-02-04: qty 0.3

## 2021-02-04 MED ORDER — HEPARIN SODIUM (PORCINE) 5000 UNIT/ML IJ SOLN
5000.0000 [IU] | INTRAMUSCULAR | Status: AC
  Administered 2021-02-04: 5000 [IU] via SUBCUTANEOUS
  Filled 2021-02-04: qty 1

## 2021-02-04 MED ORDER — LIDOCAINE 2% (20 MG/ML) 5 ML SYRINGE
INTRAMUSCULAR | Status: AC
  Filled 2021-02-04: qty 5

## 2021-02-04 MED ORDER — MIDAZOLAM HCL 5 MG/5ML IJ SOLN
INTRAMUSCULAR | Status: DC | PRN
  Administered 2021-02-04: 2 mg via INTRAVENOUS

## 2021-02-04 MED ORDER — PHENYLEPHRINE HCL-NACL 10-0.9 MG/250ML-% IV SOLN
INTRAVENOUS | Status: DC | PRN
  Administered 2021-02-04: 40 ug/min via INTRAVENOUS

## 2021-02-04 MED ORDER — ORAL CARE MOUTH RINSE
15.0000 mL | Freq: Once | OROMUCOSAL | Status: AC
  Administered 2021-02-04: 15 mL via OROMUCOSAL

## 2021-02-04 MED ORDER — EPHEDRINE SULFATE-NACL 50-0.9 MG/10ML-% IV SOSY
PREFILLED_SYRINGE | INTRAVENOUS | Status: DC | PRN
  Administered 2021-02-04: 5 mg via INTRAVENOUS

## 2021-02-04 MED ORDER — SIMETHICONE 80 MG PO CHEW
80.0000 mg | CHEWABLE_TABLET | Freq: Four times a day (QID) | ORAL | Status: DC | PRN
Start: 2021-02-04 — End: 2021-02-05
  Administered 2021-02-05: 80 mg via ORAL
  Filled 2021-02-04: qty 1

## 2021-02-04 MED ORDER — ONDANSETRON HCL 4 MG/2ML IJ SOLN: 4.0000 mg | Freq: Four times a day (QID) | INTRAMUSCULAR | Status: DC | PRN

## 2021-02-04 MED ORDER — OXYCODONE HCL 5 MG/5ML PO SOLN
5.0000 mg | Freq: Once | ORAL | Status: DC | PRN
Start: 2021-02-04 — End: 2021-02-04

## 2021-02-04 MED ORDER — 0.9 % SODIUM CHLORIDE (POUR BTL) OPTIME
TOPICAL | Status: DC | PRN
  Administered 2021-02-04: 1000 mL

## 2021-02-04 MED ORDER — ENSURE MAX PROTEIN PO LIQD
2.0000 [oz_av] | ORAL | Status: DC
  Administered 2021-02-05 (×5): 2 [oz_av] via ORAL

## 2021-02-04 MED ORDER — GABAPENTIN 100 MG PO CAPS
200.0000 mg | ORAL_CAPSULE | Freq: Two times a day (BID) | ORAL | Status: DC
  Administered 2021-02-04 – 2021-02-05 (×2): 200 mg via ORAL
  Filled 2021-02-04 (×2): qty 2

## 2021-02-04 SURGICAL SUPPLY — 82 items
ADH SKN CLS APL DERMABOND .7 (GAUZE/BANDAGES/DRESSINGS) ×1
APL PRP STRL LF DISP 70% ISPRP (MISCELLANEOUS) ×2
APPLIER CLIP 5 13 M/L LIGAMAX5 (MISCELLANEOUS) ×2
APPLIER CLIP ROT 10 11.4 M/L (STAPLE)
APR CLP MED LRG 11.4X10 (STAPLE)
APR CLP MED LRG 5 ANG JAW (MISCELLANEOUS) ×1
BAG COUNTER SPONGE SURGICOUNT (BAG) ×2 IMPLANT
BAG SPNG CNTER NS LX DISP (BAG) ×1
BLADE SURG SZ11 CARB STEEL (BLADE) ×2 IMPLANT
CANNULA REDUC XI 12-8 STAPL (CANNULA) ×2
CANNULA REDUCER 12-8 DVNC XI (CANNULA) ×1 IMPLANT
CHLORAPREP W/TINT 26 (MISCELLANEOUS) ×4 IMPLANT
CLIP APPLIE 5 13 M/L LIGAMAX5 (MISCELLANEOUS) IMPLANT
CLIP APPLIE ROT 10 11.4 M/L (STAPLE) IMPLANT
COVER MAYO STAND STRL (DRAPES) ×2 IMPLANT
COVER SURGICAL LIGHT HANDLE (MISCELLANEOUS) ×2 IMPLANT
COVER TIP SHEARS 8 DVNC (MISCELLANEOUS) IMPLANT
COVER TIP SHEARS 8MM DA VINCI (MISCELLANEOUS) ×2
DECANTER SPIKE VIAL GLASS SM (MISCELLANEOUS) ×2 IMPLANT
DERMABOND ADVANCED (GAUZE/BANDAGES/DRESSINGS) ×1
DERMABOND ADVANCED .7 DNX12 (GAUZE/BANDAGES/DRESSINGS) ×1 IMPLANT
DRAPE 3/4 80X56 (DRAPES) ×2 IMPLANT
DRAPE ARM DVNC X/XI (DISPOSABLE) ×3 IMPLANT
DRAPE COLUMN DVNC XI (DISPOSABLE) IMPLANT
DRAPE DA VINCI XI ARM (DISPOSABLE) ×6
DRAPE DA VINCI XI COLUMN (DISPOSABLE) ×2
DRSG TEGADERM 2-3/8X2-3/4 SM (GAUZE/BANDAGES/DRESSINGS) ×6 IMPLANT
ELECT REM PT RETURN 15FT ADLT (MISCELLANEOUS) ×2 IMPLANT
ENDOLOOP SUT PDS II  0 18 (SUTURE)
ENDOLOOP SUT PDS II 0 18 (SUTURE) IMPLANT
GAUZE 4X4 16PLY ~~LOC~~+RFID DBL (SPONGE) ×2 IMPLANT
GAUZE SPONGE 2X2 8PLY STRL LF (GAUZE/BANDAGES/DRESSINGS) ×1 IMPLANT
GLOVE SURG POLY ORTHO LF SZ7.5 (GLOVE) ×4 IMPLANT
GOWN STRL REUS W/TWL LRG LVL3 (GOWN DISPOSABLE) ×6 IMPLANT
GOWN STRL REUS W/TWL XL LVL3 (GOWN DISPOSABLE) ×4 IMPLANT
GRASPER SUT TROCAR 14GX15 (MISCELLANEOUS) ×2 IMPLANT
KIT BASIN OR (CUSTOM PROCEDURE TRAY) ×2 IMPLANT
MARKER SKIN DUAL TIP RULER LAB (MISCELLANEOUS) ×2 IMPLANT
MAT PREVALON FULL STRYKER (MISCELLANEOUS) ×2 IMPLANT
NDL INSUFFLATION 14GA 120MM (NEEDLE) IMPLANT
NDL SPNL 22GX3.5 QUINCKE BK (NEEDLE) ×1 IMPLANT
NEEDLE INSUFFLATION 14GA 120MM (NEEDLE) IMPLANT
NEEDLE SPNL 22GX3.5 QUINCKE BK (NEEDLE) ×2 IMPLANT
PACK CARDIOVASCULAR III (CUSTOM PROCEDURE TRAY) ×2 IMPLANT
RELOAD STAPLE 60 2.5 WHT DVNC (STAPLE) IMPLANT
RELOAD STAPLE 60 3.5 BLU DVNC (STAPLE) ×2 IMPLANT
RELOAD STAPLE 60 4.3 GRN DVNC (STAPLE) ×1 IMPLANT
RELOAD STAPLER 2.5X60 WHT DVNC (STAPLE) ×5 IMPLANT
RELOAD STAPLER 3.5X60 BLU DVNC (STAPLE) ×3 IMPLANT
RELOAD STAPLER 4.3X60 GRN DVNC (STAPLE) ×1 IMPLANT
SCISSORS LAP 5X35 DISP (ENDOMECHANICALS) ×1 IMPLANT
SEAL CANN UNIV 5-8 DVNC XI (MISCELLANEOUS) ×2 IMPLANT
SEAL XI 5MM-8MM UNIVERSAL (MISCELLANEOUS) ×4
SEALER VESSEL DA VINCI XI (MISCELLANEOUS) ×2
SEALER VESSEL EXT DVNC XI (MISCELLANEOUS) ×1 IMPLANT
SET IRRIG TUBING LAPAROSCOPIC (IRRIGATION / IRRIGATOR) ×2 IMPLANT
SLEEVE GASTRECTOMY 40FR VISIGI (MISCELLANEOUS) ×2 IMPLANT
SOL ANTI FOG 6CC (MISCELLANEOUS) ×1 IMPLANT
SOLUTION ANTI FOG 6CC (MISCELLANEOUS) ×1
SOLUTION ELECTROLUBE (MISCELLANEOUS) ×2 IMPLANT
SPONGE GAUZE 2X2 STER 10/PKG (GAUZE/BANDAGES/DRESSINGS) ×1
STAPLER 60 DA VINCI SURE FORM (STAPLE) ×2
STAPLER 60 SUREFORM DVNC (STAPLE) ×1 IMPLANT
STAPLER CANNULA SEAL DVNC XI (STAPLE) ×1 IMPLANT
STAPLER CANNULA SEAL XI (STAPLE) ×2
STAPLER RELOAD 2.5X60 WHITE (STAPLE) ×10
STAPLER RELOAD 2.5X60 WHT DVNC (STAPLE) ×5
STAPLER RELOAD 3.5X60 BLU DVNC (STAPLE) ×3
STAPLER RELOAD 3.5X60 BLUE (STAPLE) ×6
STAPLER RELOAD 4.3X60 GREEN (STAPLE) ×2
STAPLER RELOAD 4.3X60 GRN DVNC (STAPLE) ×1
SUT MNCRL AB 4-0 PS2 18 (SUTURE) ×2 IMPLANT
SUT VICRYL 0 TIES 12 18 (SUTURE) ×2 IMPLANT
SUT VLOC 180 2-0 9IN GS21 (SUTURE) IMPLANT
SYR 20ML LL LF (SYRINGE) ×2 IMPLANT
TAPE STRIPS DRAPE STRL (GAUZE/BANDAGES/DRESSINGS) ×2 IMPLANT
TOWEL OR 17X26 10 PK STRL BLUE (TOWEL DISPOSABLE) ×2 IMPLANT
TOWEL OR NON WOVEN STRL DISP B (DISPOSABLE) ×2 IMPLANT
TRAY FOLEY MTR SLVR 16FR STAT (SET/KITS/TRAYS/PACK) IMPLANT
TROCAR ADV FIXATION 5X100MM (TROCAR) IMPLANT
TROCAR BLADELESS OPT 5 100 (ENDOMECHANICALS) ×2 IMPLANT
TUBING INSUFFLATION 10FT LAP (TUBING) ×2 IMPLANT

## 2021-02-04 NOTE — Op Note (Signed)
02/04/2021 Kamauri J Hilley May 29, 1986 [id number]   PRE-OPERATIVE DIAGNOSIS:   Obesity BMI 39   Prediabetes   Dyslipidemia  POST-OPERATIVE DIAGNOSIS:  same   PROCEDURE:  Procedure(s): Xi robotic SLEEVE GASTRECTOMY  UPPER GI ENDOSCOPY  SURGEON:  Surgeon(s): Gayland Curry, MD FACS FASMBS  ASSISTANTS: Rene Paci MD   ANESTHESIA:   general  DRAINS: none   BOUGIE: 40 fr ViSiGi  LOCAL MEDICATIONS USED:   Exparel  EBL: 30 ml  SPECIMEN:  Source of Specimen:  Greater curvature of stomach  DISPOSITION OF SPECIMEN:  PATHOLOGY  COUNTS:  YES  INDICATION FOR PROCEDURE: This is a very pleasant 35 y.o.-year-old morbidly obese female who has had unsuccessful attempts for sustained weight loss. The patient presents today for a planned laparoscopic sleeve gastrectomy with upper endoscopy. We have discussed the risk and benefits of the procedure extensively preoperatively. Please see my separate notes.  PROCEDURE: After obtaining informed consent and receiving 5000 units of subcutaneous heparin, the patient was brought to the operating room at [facility] and placed supine on the operating room table. General endotracheal anesthesia was established. Sequential compression devices were placed. A orogastric tube was placed. The patient's abdomen was prepped and draped in the usual standard surgical fashion. The patient received preoperative IV antibiotic. A surgical timeout was performed. ERAS protocol used.   Access to the abdomen was achieved using a 5 mm 0 laparoscope thru a 5 mm trocar In the mid clavicular line in the left midabdomen about 3 cm to the left & slight above the level of the umbilicus using the Optiview technique. Pneumoperitoneum was smoothly established up to 15 mm of mercury. The laparoscope was advanced and the abdominal cavity was surveilled. The patient was then placed in reverse Trendelenburg.  A tap block was performed on patient's right side for postop  pain relief under direct visualization.  A 12 mm robotic trocar was placed in the mid right abdomen. There was bleeding in this area probably from the inferior epigastric artery -but tamponaded with the trocar placement.  The patient was rotated to the right.  The optical entry trocar in the left midabdomen was changed to a 8 mm robotic trocar under direct visualization.  The [facility] liver retractor was placed under the left lobe of the liver through a 5 mm trocar incision site in the subxiphoid position. A final 5 mm trocar was placed in the lateral LUQ.  A tap block was performed along the patient's left side also under direct laparoscopic visualization.     The XI robot was then docked and targeted for upper abdominal anatomy.  Arm 2  was attached to the left mid abdominal trocar and the camera was inserted.  Anatomy was targeted.  Arms 1 and 3 were then attached to the robotic trochars.  A fenestrated bipolar was placed in arm 1.  A vessel sealer was placed in arm 3.  My assistant stayed at the bedside while I went to the robotic console.  The stomach was inspected. It was completely decompressed and the orogastric tube was removed.  There was no anterior dimple that was obviously visible and her preop ugi showed no hiatal hernia.     We identified the pylorus and measured 6 cm proximal to the pylorus and identified an area of where we would start taking down the short gastric vessels.  The vessel sealer was used to take down the short gastric vessels along the greater curvature of the stomach. We were able  to enter the lesser sac. We continued to march along the greater curvature of the stomach taking down the short gastrics.  My assistant provided traction laterally to patient's left side.  as we approached the gastrosplenic ligament we took care in this area not to injure the spleen. We were able to take down the entire gastrosplenic ligament. We then mobilized the fundus away from the left crus of  diaphragm. There were not any significant posterior gastric avascular attachments. This left the stomach completely mobilized. No vessels had been taken down along the lesser curvature of the stomach.   We then reidentified the pylorus. A 40Fr ViSiGi was then placed in the oropharynx and advanced down into the stomach and placed in the distal antrum and positioned along the lesser curvature. It was placed under suction which secured the 40Fr ViSiGi in place along the lesser curve. Her stomach appeared thin. Then using the robotic 60 mm stapler with a blue load, I placed a stapler along the antrum approximately 6cm from the pylorus. The stapler was angled so that there is ample room at the angularis incisura. I then fired the first staple load after inspecting it posteriorly to ensure adequate space both anteriorly and posteriorly.  The stapler did not have to pause for compression so at this point I started using 60 mm white load staple cartridges. The robotic stapler was then repositioned with a 60 mm white load  and we continued to march up along the [place]. My assistant was holding traction along the greater curvature stomach along the cauterized short gastric vessels ensuring that the stomach was symmetrically retracted. Prior to each firing of the staple, we rotated the stomach to ensure that there is adequate stomach left.  As we approached the fundus, I used 60 mm blue cartridge aiming  lateral to the GE junction after mobilizing some of the esophageal fat pad.  The sleeve was inspected. There is no evidence of cork screw. The staple line appeared hemostatic except along the first & second portions of the staple line at the antrum where there was  little oozing.  The assistant placed 4 hemoclips at this point there was excellent hemostasis. The CRNA inflated the ViSiGi to the green zone and the upper abdomen was flooded with saline. There were no bubbles. The sleeve was decompressed and the ViSiGi removed.I  performed an upper endoscopy.  The endoscope was placed in the patient's oropharynx and gently glided down.  Z-line at approximately 37 cm.  The endoscope was advanced into the gastric sleeve and insufflated.  I was able to easily advance the endoscope down into the antrum.  Pylorus was visible.  There is no significant stricture at the incisura.  There was no corkscrewing.  There is no evidence of internal bleeding.  The sleeve was decompressed and the endoscope was removed.  reinspeced the sleeve staple line and no more oozing noted along staple line.  The greater curvature the stomach was grasped with a laparoscopic grasper.  At this point I scrubbed back in.  The robot was undocked.  We removed the specimen from the 12 mm trocar site.  The liver retractor was removed. I then closed the 12 mm trocar site with 4 interrupted 0 Vicryl sutures through the fascia using the PMI because of the bleeding that had occurred at that location on trocar insertion. She had evidence of lower abdominal preperitoneal hematoma. We noticed no additional bleeding. We lowered intra-abdominal pressure and again no bleeding noted. The closure  was viewed laparoscopically and it was airtight. Remaining Exparel was then infiltrated in the preperitoneal spaces around the trocar sites. Pneumoperitoneum was released. All trocar sites were closed with a 4-0 Monocryl in a subcuticular fashion followed by the application of steri-strips and tegarderms. The patient was extubated and taken to the recovery room in stable condition. All needle, instrument, and sponge counts were correct x2. There are no immediate complications  (1) 60 mm blue (5) 60 mm white  Findings - right lower abdominal wall hematoma at 12 mm trocar insertion site  PLAN OF CARE: Admit to inpatient   PATIENT DISPOSITION:  PACU - hemodynamically stable.   Delay start of Pharmacological VTE agent (>24hrs) due to surgical blood loss or risk of bleeding:  yes - start pod  1 am  Leighton Ruff. Redmond Pulling, MD, FACS FASMBS General, Bariatric, & Minimally Invasive Surgery [facility], [place]

## 2021-02-04 NOTE — Anesthesia Postprocedure Evaluation (Signed)
Anesthesia Post Note  Patient: Laura Cross  Procedure(s) Performed: XI ROBOT ASSISTED GASTRIC SLEEVE RESECTION UPPER GI ENDOSCOPY     Patient location during evaluation: PACU Anesthesia Type: General Level of consciousness: awake and alert, oriented and patient cooperative Pain management: pain level controlled Vital Signs Assessment: post-procedure vital signs reviewed and stable Respiratory status: spontaneous breathing, nonlabored ventilation and respiratory function stable Cardiovascular status: blood pressure returned to baseline and stable Postop Assessment: no apparent nausea or vomiting Anesthetic complications: no   No notable events documented.  Last Vitals:  Vitals:   02/04/21 1330 02/04/21 1345  BP: 119/67 118/67  Pulse: 71 72  Resp: 12 (!) 7  Temp: (!) 36.2 C (!) 36.4 C  SpO2: 100% 100%    Last Pain:  Vitals:   02/04/21 1345  TempSrc:   PainSc: [place]

## 2021-02-04 NOTE — H&P (Signed)
CC: here for surgery  Requesting provider: n/a  HPI: Laura Cross is an 35 y.o. female who is here for robotic/lap sleeve gastrectomy.  Denies any changes since seen in clinic in April  Old HPI: The patient is a 35 year old female who presents for a bariatric surgery evaluation. She comes in for long-term follow-up regarding her obesity.  She has completed the bariatric surgery evaluation process.  I initially met her last February.  She denies any trips to the emergency room or hospital.  She denies any surgeries in the past year.  Her upper GI last year along with a chest x-ray were unremarkable.  Her bariatric evaluation labs the most part were unremarkable except for a few items.  She is prediabetic with an A1c of 6.  She does have hypercholesterolemia.  Her cholesterol level was 269, triglycerides 187, HDL low at 39.  Hemoglobin 11, hematocrit 34, vitamin D low at 19.  She has received psychological and nutritional clearance.  She denies any reflux.  She denies any indigestion.  Denies any chest pain, chest pressure, shortness of breath, dyspnea on exertion.  She denies any tobacco use.  09/22/19  She is referred by Dorothea Ogle PA-C for evaluation of weight loss surgery.  She completed our seminar on line.  She is interested in sleeve gastrectomy.  She believes this is the best option for her.  Despite numerous attempts for sustained weight loss she is been unsuccessful.  She has been on phentermine in the past, ketogenic diet-as well as working with a dietitian-all without long-term success.  Her comorbidities include dyslipidemia, PCO S, and prediabetes.  She denies any chest pain, chest pressure, source of breath, orthopnea, dyspnea on exertion, paroxysmal nocturnal dyspnea.  She denies any personal or family history of blood clots.  Her sleep score questionnaire was low risk.  She has occasional heartburn about once every 3 months.  She has a daily bowel movement.  She denies any  melena or hematochezia.  She has had a C-section.  She has a history of PCO S.  She has regular menses.  She did have back pain but underwent a breast reduction which is subsequently help with her back pain.  She states that she was diagnosed with prediabetes.  She denies any severe headaches or vision changes.  She denies any tobacco or drug use.  She drinks alcohol maybe once or twice a year.  She is working from home for [employer]  I reviewed [employer] office note from August 18, 2019 as well as from June 02, 2019  I reviewed labs from September 2020 which showed a normal comprehensive metabolic panel except for a mildly elevated ALT of 41, normal CBC.  TSH was 1.72.  Total cholesterol 277, triglycerides 242, HDL 37  Past Medical History:  Diagnosis Date   Anemia    Elevated liver enzymes    Family history of premature CAD    father   Fibroids    Impaired fasting blood sugar    Miscarriage 2014   Mixed dyslipidemia    Ovarian cyst    Pre-diabetes    Sexually transmitted disease (STD)    hx/o HPV, gonorrhea, chlamydia, genital herpes   Syphilis 2010    Past Surgical History:  Procedure Laterality Date   BREAST REDUCTION SURGERY Bilateral 12/04/2015   Procedure: BILATERAL BREAST REDUCTION  WITH LIPOSUCTION;  Surgeon: [facility] Going, DO;  Location: [facility];  Service: Plastics;  Laterality: Bilateral;  CESAREAN SECTION N/A 02/14/2018   Procedure: CESAREAN SECTION;  Surgeon: Crawford Givens, MD;  Location: [facility];  Service: Obstetrics;  Laterality: N/A;    Family History  Problem Relation Age of Onset   Heart disease Father 25       died MI   Hypertension Maternal Grandmother    Diabetes Maternal Grandmother    Stroke Maternal Grandmother    Heart disease Maternal Grandmother    Hypertension Maternal Grandfather    COPD Maternal Grandfather    Cancer Maternal Grandfather 33       colon   GER disease Mother    Deep vein thrombosis  Mother        with MVA   Heart disease Mother 33       LVH   COPD Paternal Grandfather    Pneumonia Brother    Cancer Maternal Aunt        breast   Cancer Cousin        breast    Social:  reports that she has never smoked. She has never used smokeless tobacco. She reports current alcohol use. She reports that she does not use drugs.  Allergies:  Allergies  Allergen Reactions   Meloxicam Other (See Comments)    Bleeding?  Can tolerate other NSAIDs   Metformin And Related Diarrhea and Other (See Comments)    hair falling out   Nuvaring [Etonogestrel-Ethinyl Estradiol] Other (See Comments)    Flu-like symptoms    Medications: I have reviewed the patient's current medications.   ROS - all of the below systems have been reviewed with the patient and positives are indicated with bold text General: chills, fever or night sweats Eyes: blurry vision or double vision ENT: epistaxis or sore throat Allergy/Immunology: itchy/watery eyes or nasal congestion Hematologic/Lymphatic: bleeding problems, blood clots or swollen lymph nodes Endocrine: temperature intolerance or unexpected weight changes Breast: new or changing breast lumps or nipple discharge Resp: cough, shortness of breath, or wheezing CV: chest pain or dyspnea on exertion GI: as per HPI GU: dysuria, trouble voiding, or hematuria MSK: joint pain or joint stiffness Neuro: TIA or stroke symptoms Derm: pruritus and skin lesion changes Psych: anxiety and depression  PE Blood pressure 138/77, pulse 80, temperature 98.1 F (36.7 C), temperature source Oral, resp. rate 16, last menstrual period 01/08/2021, SpO2 99 %. Constitutional: NAD; conversant; no deformities Eyes: Moist conjunctiva; no lid lag; anicteric; PERRL Neck: Trachea midline; no thyromegaly Lungs: Normal respiratory effort; no tactile fremitus CV: RRR; no palpable thrills; no pitting edema GI: Abd soft, nt, nd; no palpable hepatosplenomegaly MSK: Normal gait;  no clubbing/cyanosis Psychiatric: Appropriate affect; alert and oriented x3 Lymphatic: No palpable cervical or axillary lymphadenopathy Skin:no rash/edema/lesion  Results for orders placed or performed during the hospital encounter of 02/04/21 (from the past 48 hour(s))  Pregnancy, urine STAT morning of surgery     Status: None   Collection Time: 02/04/21  8:44 AM  Result Value Ref Range   Preg Test, Ur NEGATIVE NEGATIVE    Comment:        THE SENSITIVITY OF THIS METHODOLOGY IS >20 mIU/mL. Performed at [facility], [place] [address] [place], [place] [address]     No results found.  Imaging: Personally reviewed  A/P: Laura Cross is an 35 y.o. female with obesity, dyslipidemia, prediabetes, PCOS  To OR for robotic sleeve gastrectomy, discussed use of robot IV abx ERAS  All questions asked and answered  Leighton Ruff. Redmond Pulling, MD,  FACS General, Bariatric, & Minimally Invasive Surgery [facility] [place]

## 2021-02-04 NOTE — Anesthesia Procedure Notes (Signed)
Procedure Name: Intubation Date/Time: 02/04/2021 10:42 AM Performed by: Claudia Desanctis, CRNA Pre-anesthesia Checklist: Patient identified, Emergency Drugs available, Suction available and Patient being monitored Patient Re-evaluated:Patient Re-evaluated prior to induction Oxygen Delivery Method: Circle system utilized Preoxygenation: Pre-oxygenation with 100% oxygen Induction Type: IV induction Ventilation: Mask ventilation without difficulty Laryngoscope Size: 2 and Miller Grade View: Grade I Tube type: Oral Tube size: 7.0 mm Number of attempts: 1 Airway Equipment and Method: Stylet Placement Confirmation: ETT inserted through vocal cords under direct vision, positive ETCO2 and breath sounds checked- equal and bilateral Secured at: 21 cm Tube secured with: Tape Dental Injury: Teeth and Oropharynx as per pre-operative assessment

## 2021-02-04 NOTE — Progress Notes (Signed)
PHARMACY CONSULT FOR:  Risk Assessment for Post-Discharge VTE Following Bariatric Surgery  Post-Discharge VTE Risk Assessment: This patient's probability of 30-day post-discharge VTE is increased due to the factors marked:   Female    Age >/=60 years    BMI >/=50 kg/m2    CHF    Dyspnea at Rest    Paraplegia  x  Non-gastric-band surgery    Operation Time >/=3 hr    Return to OR     Length of Stay >/= 3 d   Hx of VTE   Hypercoagulable condition   Significant venous stasis    Predicted probability of 30-day post-discharge VTE: 0.16%  Recommendation for Discharge: No pharmacologic prophylaxis post-discharge  Laura Cross is a 35 y.o. female who underwent sleeve gastrectomy on 02/04/21   Case start: 1059 Case end: 1212   Allergies  Allergen Reactions   Meloxicam Other (See Comments)    Bleeding?  Can tolerate other NSAIDs   Metformin And Related Diarrhea and Other (See Comments)    hair falling out   Nuvaring [Etonogestrel-Ethinyl Estradiol] Other (See Comments)    Flu-like symptoms    Patient Measurements:   There is no height or weight on file to calculate BMI.  Recent Labs    02/04/21 1337  HGB 11.4*  HCT 37.6   Estimated Creatinine Clearance: 96.1 mL/min (by C-G formula based on SCr of 0.85 mg/dL).    Past Medical History:  Diagnosis Date   Anemia    Elevated liver enzymes    Family history of premature CAD    father   Fibroids    Impaired fasting blood sugar    Miscarriage 2014   Mixed dyslipidemia    Ovarian cyst    Pre-diabetes    Sexually transmitted disease (STD)    hx/o HPV, gonorrhea, chlamydia, genital herpes   Syphilis 2010     Medications Prior to Admission  Medication Sig Dispense Refill Last Dose   Ibuprofen-Acetaminophen (ADVIL DUAL ACTION) 125-250 MG TABS Take 2 tablets by mouth 3 (three) times daily as needed (pain).   Past Month   Multiple Vitamins-Minerals (ADULT ONE DAILY GUMMIES PO) Take 2 tablets by mouth every evening.    02/03/2021   rosuvastatin (CRESTOR) 10 MG tablet Take 1 tablet (10 mg total) by mouth daily. (Patient taking differently: Take 10 mg by mouth every evening.) 90 tablet 3 Past Week   valACYclovir (VALTREX) 1000 MG tablet 2 tablets po BID x 1 day for flare up (Patient taking differently: Take 2,000 mg by mouth 2 (two) times daily as needed (flare ups).) 20 tablet 2 More than a month     Lenis Noon, PharmD 02/04/2021,3:15 PM

## 2021-02-04 NOTE — Transfer of Care (Signed)
Immediate Anesthesia Transfer of Care Note  Patient: Stormy Card  Procedure(s) Performed: XI ROBOT ASSISTED GASTRIC SLEEVE RESECTION UPPER GI ENDOSCOPY  Patient Location: PACU  Anesthesia Type:General  Level of Consciousness: drowsy  Airway & Oxygen Therapy: Patient Spontanous Breathing and Patient connected to face mask  Post-op Assessment: Report given to RN and Post -op Vital signs reviewed and stable  Post vital signs: Reviewed and stable  Last Vitals:  Vitals Value Taken Time  BP 122/62 02/04/21 1226  Temp    Pulse 85 02/04/21 1230  Resp 15 02/04/21 1230  SpO2 100 % 02/04/21 1230  Vitals shown include unvalidated device data.  Last Pain:  Vitals:   02/04/21 0854  TempSrc: Oral         Complications: No notable events documented.

## 2021-02-04 NOTE — Progress Notes (Signed)
Patient alert and oriented.   Provided support and encouragement.  Encouraged pulmonary toilet, ambulation and small sips of liquids.  Discharge instructions discussed w/ pt and family, including BELT program, support groups and outpt pharmacy.  All questions answered.  Will continue to monitor.

## 2021-02-04 NOTE — Progress Notes (Signed)
Started water.

## 2021-02-05 ENCOUNTER — Other Ambulatory Visit (HOSPITAL_COMMUNITY): Payer: Self-pay

## 2021-02-05 ENCOUNTER — Encounter (HOSPITAL_COMMUNITY): Payer: Self-pay | Admitting: General Surgery

## 2021-02-05 LAB — CBC WITH DIFFERENTIAL/PLATELET
Abs Immature Granulocytes: 0.01 10*3/uL (ref 0.00–0.07)
Basophils Absolute: 0 10*3/uL (ref 0.0–0.1)
Basophils Relative: 0 %
Eosinophils Absolute: 0 10*3/uL (ref 0.0–0.5)
Eosinophils Relative: 0 %
HCT: 35.5 % — ABNORMAL LOW (ref 36.0–46.0)
Hemoglobin: 10.9 g/dL — ABNORMAL LOW (ref 12.0–15.0)
Immature Granulocytes: 0 %
Lymphocytes Relative: 8 %
Lymphs Abs: 0.6 10*3/uL — ABNORMAL LOW (ref 0.7–4.0)
MCH: 24.9 pg — ABNORMAL LOW (ref 26.0–34.0)
MCHC: 30.7 g/dL (ref 30.0–36.0)
MCV: 81.2 fL (ref 80.0–100.0)
Monocytes Absolute: 0.6 10*3/uL (ref 0.1–1.0)
Monocytes Relative: 8 %
Neutro Abs: 6.3 10*3/uL (ref 1.7–7.7)
Neutrophils Relative %: 84 %
Platelets: 285 10*3/uL (ref 150–400)
RBC: 4.37 MIL/uL (ref 3.87–5.11)
RDW: 14.6 % (ref 11.5–15.5)
WBC: 7.4 10*3/uL (ref 4.0–10.5)
nRBC: 0 % (ref 0.0–0.2)

## 2021-02-05 LAB — COMPREHENSIVE METABOLIC PANEL
ALT: 31 U/L (ref 0–44)
AST: 23 U/L (ref 15–41)
Albumin: 4.2 g/dL (ref 3.5–5.0)
Alkaline Phosphatase: 55 U/L (ref 38–126)
Anion gap: 5 (ref 5–15)
BUN: 9 mg/dL (ref 6–20)
CO2: 26 mmol/L (ref 22–32)
Calcium: 9.4 mg/dL (ref 8.9–10.3)
Chloride: 106 mmol/L (ref 98–111)
Creatinine, Ser: 0.78 mg/dL (ref 0.44–1.00)
GFR, Estimated: >60 mL/min (ref >60)
Glucose, Bld: 152 mg/dL — ABNORMAL HIGH (ref 70–99)
Potassium: 4.1 mmol/L (ref 3.5–5.1)
Sodium: 137 mmol/L (ref 135–145)
Total Bilirubin: 0.4 mg/dL (ref 0.3–1.2)
Total Protein: 7.6 g/dL (ref 6.5–8.1)

## 2021-02-05 LAB — SURGICAL PATHOLOGY

## 2021-02-05 MED ORDER — TRAMADOL HCL 50 MG PO TABS
50.0000 mg | ORAL_TABLET | Freq: Four times a day (QID) | ORAL | 0 refills | Status: DC | PRN
  Filled 2021-02-05: qty 10, 3d supply, fill #0

## 2021-02-05 MED ORDER — ONDANSETRON 4 MG PO TBDP
4.0000 mg | ORAL_TABLET | Freq: Four times a day (QID) | ORAL | 0 refills | Status: DC | PRN
  Filled 2021-02-05: qty 20, 5d supply, fill #0

## 2021-02-05 MED ORDER — ACETAMINOPHEN 500 MG PO TABS: 1000.0000 mg | ORAL_TABLET | Freq: Three times a day (TID) | ORAL | 0 refills | Status: AC

## 2021-02-05 MED ORDER — PANTOPRAZOLE SODIUM 40 MG PO TBEC
40.0000 mg | DELAYED_RELEASE_TABLET | Freq: Every day | ORAL | 0 refills | Status: DC
  Filled 2021-02-05: qty 30, 30d supply, fill #0

## 2021-02-05 MED ORDER — GABAPENTIN 100 MG PO CAPS
200.0000 mg | ORAL_CAPSULE | Freq: Two times a day (BID) | ORAL | 0 refills | Status: DC
  Filled 2021-02-05: qty 20, 5d supply, fill #0

## 2021-02-05 NOTE — Progress Notes (Signed)
D/C instructions given to patient. Patient had no questions. NT or writer will wheel patient out once she is dressed

## 2021-02-05 NOTE — Discharge Instructions (Signed)
GASTRIC BYPASS/SLEEVE  Home Care Instructions   These instructions are to help you care for yourself when you go home.  Call: If you have any problems. Call [phone] and ask for the surgeon on call If you need immediate help, come to the ER at [facility].  Tell the ER staff that you are a new post-op gastric bypass or gastric sleeve patient   Signs and symptoms to report: Severe vomiting or nausea If you cannot keep down clear liquids for longer than 1 day, call your surgeon  Abdominal pain that does not get better after taking your pain medication Fever over 100.4 F with chills Heart beating over 100 beats a minute Shortness of breath at rest Chest pain  Redness, swelling, drainage, or foul odor at incision (surgical) sites  If your incisions open or pull apart Swelling or pain in calf (lower leg) Diarrhea (Loose bowel movements that happen often), frequent watery, uncontrolled bowel movements Constipation, (no bowel movements for 3 days) if this happens: Pick one Milk of Magnesia, 2 tablespoons by mouth, 3 times a day for 2 days if needed Stop taking Milk of Magnesia once you have a bowel movement Call your doctor if constipation continues Or Miralax  (instead of Milk of Magnesia) following the label instructions Stop taking Miralax once you have a bowel movement Call your doctor if constipation continues Anything you think is not normal   Normal side effects after surgery: Unable to sleep at night or unable to focus Irritability or moody Being tearful (crying) or depressed These are common complaints, possibly related to your anesthesia medications that put you to sleep, stress of surgery, and change in lifestyle.  This usually goes away a few weeks after surgery.  If these feelings continue, call your primary care doctor.   Wound Care: You may have surgical glue, steri-strips, or staples over your incisions after surgery Surgical glue:  Looks like a clear film  over your incisions and will wear off a little at a time Steri-strips: Strips of tape over your incisions. You may notice a yellowish color on the skin under the steri-strips. This is used to make the   steri-strips stick better. Do not pull the steri-strips off - let them fall off Staples: Staples may be removed before you leave the hospital If you go home with staples, call [facility], [phone]) [phone] at for an appointment with your surgeon's nurse to have staples removed 10 days after surgery. Showering: You may shower two (2) days after your surgery unless your surgeon tells you differently Wash gently around incisions with warm soapy water, rinse well, and gently pat dry  No tub baths until staples are removed, steri-strips fall off or glue is gone.    Medications: Medications should be liquid or crushed if larger than the size of a dime Extended release pills (medication that release a little bit at a time through the day) should NOT be crushed or cut. (examples include XL, ER, DR, SR) Depending on the size and number of medications you take, you may need to space (take a few throughout the day)/change the time you take your medications so that you do not over-fill your pouch (smaller stomach) Make sure you follow-up with your primary care doctor to make medication changes needed during rapid weight loss and life-style changes If you have diabetes, follow up with the doctor that orders your diabetes medication(s) within one week after surgery and check your blood sugar regularly.  Do not drive while taking prescription pain medication  It is ok to take Tylenol by the bottle instructions with your pain medicine or instead of your pain medicine as needed.  DO NOT TAKE NSAIDS (EXAMPLES OF NSAIDS:  IBUPROFREN/ NAPROXEN)  Diet:                    First 2 Weeks  You will see the dietician t about two (2) weeks after your surgery. The dietician will increase the types of foods you can eat  if you are handling liquids well: If you have severe vomiting or nausea and cannot keep down clear liquids lasting longer than 1 day, call your surgeon @ [phone]) Protein Shake Drink at least 2 ounces of shake 5-6 times per day Each serving of protein shakes (usually 8 - 12 ounces) should have: 15 grams of protein  And no more than 5 grams of carbohydrate  Goal for protein each day: Men = 80 grams per day Women = 60 grams per day Protein powder may be added to fluids such as non-fat milk or Lactaid milk or unsweetened Soy/Almond milk (limit to 35 grams added protein powder per serving)  Hydration Slowly increase the amount of water and other clear liquids as tolerated (See Acceptable Fluids) Slowly increase the amount of protein shake as tolerated   Sip fluids slowly and throughout the day.  Do not use straws. May use sugar substitutes in small amounts (no more than 6 - 8 packets per day; i.e. Splenda)  Fluid Goal The first goal is to drink at least 8 ounces of protein shake/drink per day (or as directed by the nutritionist); some examples of protein shakes are Johnson [employer] Johnson, AMR Corporation, EAS Edge HP, and Unjury. See handout from pre-op Bariatric Education Class: Slowly increase the amount of protein shake you drink as tolerated You may find it easier to slowly sip shakes throughout the day It is important to get your proteins in first Your fluid goal is to drink 64 - 100 ounces of fluid daily It may take a few weeks to build up to this 32 oz (or more) should be clear liquids  And  32 oz (or more) should be full liquids (see below for examples) Liquids should not contain sugar, caffeine, or carbonation  Clear Liquids: Water or Sugar-free flavored water (i.e. Fruit H2O, Propel) Decaffeinated coffee or tea (sugar-free) Crystal Lite, Wyler's Lite, Minute Maid Lite Sugar-free Jell-O Bouillon or broth Sugar-free Popsicle:   *Less than 20 calories each; Limit 1 per  day  Full Liquids: Protein Shakes/Drinks + 2 choices per day of other full liquids Full liquids must be: No More Than 15 grams of Carbs per serving  No More Than 3 grams of Fat per serving Strained low-fat cream soup (except Cream of Potato or Tomato) Non-Fat milk Fat-free Lactaid Milk Unsweetened Soy Or Unsweetened Almond Milk Low Sugar yogurt (Dannon Lite & Fit, Greek yogurt; Oikos Triple Zero; Chobani Simply 100; Yoplait 100 calorie [place] - No Fruit on the Bottom)    Vitamins and Minerals Start 1 day after surgery unless otherwise directed by your surgeon 2 Chewable Bariatric Specific Multivitamin / Multimineral Supplement with iron (Example: Bariatric Advantage Multi EA) Chewable Calcium with Vitamin D-3 (Example: 3 Chewable Calcium Plus 600 with Vitamin D-3) Take 500 mg three (3) times a day for a total of 1500 mg each day Do not take all 3 doses of calcium at one time as it may cause constipation, and  you can only absorb 500 mg  at a time  Do not mix multivitamins containing iron with calcium supplements; take 2 hours apart Menstruating women and those with a history of anemia (a blood disease that causes weakness) may need extra iron Talk with your doctor to see if you need more iron Do not stop taking or change any vitamins or minerals until you talk to your dietitian or surgeon Your Dietitian and/or surgeon must approve all vitamin and mineral supplements   Activity and Exercise: Limit your physical activity as instructed by your doctor.  It is important to continue walking at home.  During this time, use these guidelines: Do not lift anything greater than ten (10) pounds for at least two (2) weeks Do not go back to work or drive until Engineer, production says you can You may have sex when you feel comfortable  It is VERY important for female patients to use a reliable birth control method; fertility often increases after surgery  All hormonal birth control will be ineffective for 30  days after surgery due to medications given during surgery a barrier method must be used. Do not get pregnant for at least 18 months Start exercising as soon as your doctor tells you that you can Make sure your doctor approves any physical activity Start with a simple walking program Walk 5-15 minutes each day, 7 days per week.  Slowly increase until you are walking 30-45 minutes per day Consider joining our [place] program. [phone] or email [email or web address]   Special Instructions Things to remember: Use your CPAP when sleeping if this applies to you  [facility] has two free Bariatric Surgery Support Groups that meet monthly The 3rd Thursday of each month, 6 pm, [place]  The 2nd Friday of each month, 11:45 am in the private dining room in the basement of [place] It is very important to keep all follow up appointments with your surgeon, dietitian, primary care physician, and behavioral health practitioner Routine follow up schedule with your surgeon include appointments at 2-3 weeks, 6-8 weeks, 6 months, and 1 year at a minimum.  Your surgeon may request to see you more often.   After the first year, please follow up with your bariatric surgeon and dietitian at least once a year in order to maintain best weight loss results [facility]: [place]: [phone] Bariatric Nurse Coordinator: [phone]      Reviewed and Endorsed  by [facility] Patient Education Committee, June, 2016 Edits Approved: Aug, 2018

## 2021-02-05 NOTE — Progress Notes (Signed)
Nutrition Education Note ° °Received consult for diet education for patient s/p bariatric surgery. ° °Discussed 2 week post op diet with pt. Emphasized that liquids must be non carbonated, non caffeinated, and sugar free. Fluid goals discussed. Pt to follow up with outpatient bariatric RD for further diet progression after 2 weeks. Multivitamins and minerals also reviewed. Teach back method used, pt expressed understanding, expect good compliance. ° °If nutrition issues arise, please consult RD. ° °Laken Lobato, MS, RD, LDN °Inpatient Clinical Dietitian °Contact information available via Amion ° ° °

## 2021-02-05 NOTE — Discharge Summary (Signed)
Physician Discharge Summary  Laura Cross FVC:[id number] DOB: 1985/11/12 DOA: 02/04/2021  PCP: Carlena Hurl, PA-C  Admit date: 02/04/2021 Discharge date: 02/05/2021  Recommendations for Outpatient Follow-up:    Follow-up Information     Greer Pickerel, MD. Go on 02/26/2021.   Specialty: General Surgery Why: For wound re-check; at 930AM; PLEASE ARRIVE AT 9AM Contact information: [address] [place] [address] [phone]         Carlena Hurl, PA-C. Go on 04/01/2021.   Specialty: General Surgery Why: for postop check 2:30 PM; please arrive at 2:15 pm Contact information: [place] [address] [phone]                Discharge Diagnoses:  Principal Problem:   BMI 39.0-39.9,adult Active Problems:   Prediabetes   Dyslipidemia   S/P robotic sleeve gastrectomy   Surgical Procedure: Robotic Sleeve Gastrectomy, upper endoscopy  Discharge Condition: Good Disposition: Home  Diet recommendation: Postoperative sleeve gastrectomy diet (liquids only)  Filed Weights   02/05/21 0944  Weight: 91.6 kg     Hospital Course:  The patient was admitted for a planned robotic sleeve gastrectomy. Please see operative note. Preoperatively the patient was given 5000 units of subcutaneous heparin for DVT prophylaxis. Postoperative prophylactic Lovenox dosing was started on the evening of postoperative day 0. ERAS protocol was used. On the evening of postoperative day 0, the patient was started on water and ice chips. On postoperative day 1 the patient had no fever or tachycardia and was tolerating water in their diet was gradually advanced throughout the day. The patient was ambulating without difficulty. Their vital signs are stable without fever or tachycardia. Their hemoglobin had remained stable. The patient had received discharge instructions and counseling. They were deemed stable for discharge and had met discharge criteria  BP  124/72   Pulse 66   Temp 97.7 F (36.5 C) (Oral)   Resp 18   Ht 5' 1"  (1.549 m)   Wt 91.6 kg   LMP 01/08/2021   SpO2 100%   BMI 38.17 kg/m   Gen: alert, NAD, non-toxic appearing Pupils: equal, no scleral icterus Pulm: Lungs clear to auscultation, symmetric chest rise CV: regular rate and rhythm Abd: soft, mild tender, nondistended. No cellulitis. No incisional hernia Ext: no edema, no calf tenderness Skin: no rash, no jaundice   Discharge Instructions  Discharge Instructions     Ambulate hourly while awake   Complete by: As directed    Call MD for:  difficulty breathing, headache or visual disturbances   Complete by: As directed    Call MD for:  persistant dizziness or light-headedness   Complete by: As directed    Call MD for:  persistant nausea and vomiting   Complete by: As directed    Call MD for:  redness, tenderness, or signs of infection (pain, swelling, redness, odor or green/yellow discharge around incision site)   Complete by: As directed    Call MD for:  severe uncontrolled pain   Complete by: As directed    Call MD for:  temperature >101 F   Complete by: As directed    Diet bariatric full liquid   Complete by: As directed    Discharge instructions   Complete by: As directed    See bariatric discharge instructions   Incentive spirometry   Complete by: As directed    Perform hourly while awake      Allergies as of 02/05/2021  Reactions   Meloxicam Other (See Comments)   Bleeding?  Can tolerate other NSAIDs   Metformin And Related Diarrhea, Other (See Comments)   hair falling out   Nuvaring [etonogestrel-ethinyl Estradiol] Other (See Comments)   Flu-like symptoms        Medication List     STOP taking these medications    Advil Dual Action 125-250 MG Tabs Generic drug: Ibuprofen-Acetaminophen       TAKE these medications    acetaminophen 500 MG tablet Commonly known as: TYLENOL Take 2 tablets (1,000 mg total) by mouth every 8  (eight) hours for 5 days.   ADULT ONE DAILY GUMMIES PO Take 2 tablets by mouth every evening.   gabapentin 100 MG capsule Commonly known as: NEURONTIN Take 2 capsules (200 mg total) by mouth every 12 (twelve) hours.   ondansetron 4 MG disintegrating tablet Commonly known as: ZOFRAN-ODT Take 1 tablet (4 mg total) by mouth every 6 (six) hours as needed for nausea or vomiting.   pantoprazole 40 MG tablet Commonly known as: PROTONIX Take 1 tablet (40 mg total) by mouth daily.   traMADol 50 MG tablet Commonly known as: ULTRAM Take 1 tablet (50 mg total) by mouth every 6 (six) hours as needed (pain).       ASK your doctor about these medications    rosuvastatin 10 MG tablet Commonly known as: Crestor Take 1 tablet (10 mg total) by mouth daily.   valACYclovir 1000 MG tablet Commonly known as: VALTREX 2 tablets po BID x 1 day for flare up        Follow-up Information     Greer Pickerel, MD. Go on 02/26/2021.   Specialty: General Surgery Why: For wound re-check; at 930AM; PLEASE ARRIVE AT 9AM Contact information: [address] [place] [address] [phone]         Carlena Hurl, PA-C. Go on 04/01/2021.   Specialty: General Surgery Why: for postop check 2:30 PM; please arrive at 2:15 pm Contact information: [place] [address] [phone]                  The results of significant diagnostics from this hospitalization (including imaging, microbiology, ancillary and laboratory) are listed below for reference.    Significant Diagnostic Studies: No results found.  Labs: Basic Metabolic Panel: Recent Labs  Lab 02/05/21 0421  NA 137  K 4.1  CL 106  CO2 26  GLUCOSE 152*  BUN 9  CREATININE 0.78  CALCIUM 9.4   Liver Function Tests: Recent Labs  Lab 02/05/21 0421  AST 23  ALT 31  ALKPHOS 55  BILITOT 0.4  PROT 7.6  ALBUMIN 4.2    CBC: Recent Labs  Lab 02/04/21 1337 02/05/21 0421  WBC  --  7.4   NEUTROABS  --  6.3  HGB 11.4* 10.9*  HCT 37.6 35.5*  MCV  --  81.2  PLT  --  285    CBG: No results for input(s): GLUCAP in the last 168 hours.  Principal Problem:   BMI 39.0-39.9,adult Active Problems:   Prediabetes   Dyslipidemia   S/P laparoscopic sleeve gastrectomy   Time coordinating discharge: 15 min  Signed:  Gayland Curry, MD [facility], [place] [phone] 02/05/2021, 2:45 PM

## 2021-02-06 ENCOUNTER — Other Ambulatory Visit (HOSPITAL_COMMUNITY): Payer: Self-pay

## 2021-02-11 ENCOUNTER — Telehealth (HOSPITAL_COMMUNITY): Payer: Self-pay | Admitting: *Deleted

## 2021-02-11 NOTE — Telephone Encounter (Signed)
1.  Tell me about your pain and pain management? Pt denies any pain.  2.  Let's talk about fluid intake.  How much total fluid are you taking in? Pt states that she is getting in at least 64oz of fluid including protein shakes, bottled water, broth and Gatorade zero  3.  How much protein have you taken in the last 2 days? Pt states that she is working to meet goal of goal of 60g of protein today.  Pt has already consumed 1.5 protein shake(s).  Pt plans to drink remainder of protein throughout the rest of the day to meet goal.  Pt is waiting for unflavored protein to be delivered.  4.  Have you had nausea?  Tell me about when have experienced nausea and what you did to help? Pt denies nausea.   5.  Has the frequency or color changed with your urine? Pt states that she is urinating "fine" with no changes in frequency or urgency.     6.  Tell me what your incisions look like? "Incisions look fine". Pt denies a fever, chills.  Pt states incisions are not swollen, open, or draining.  Pt encouraged to call [facility] if incisions change.   7.  Have you been passing gas? BM? Pt states that she is having BMs. Last BM 02/09/21.     8.  If a problem or question were to arise who would you call?  Do you know contact numbers for [facility], [facility], and [facility]? Pt denies dehydration symptoms.  Pt can describe s/sx of dehydration.  Pt knows to call [facility] for surgical, [facility] for nutrition, and [facility] for non-urgent questions or concerns.   9.  How has the walking going? Pt states she is walking around and able to be active without difficulty.   10. Are you still using your incentive spirometer?  If so, how often?  Pt states that she has stopped using IS since POD 4 or 5. Pt encouraged to use incentive spirometer, at least 10x every hour while awake until she sees the surgeon.  11.  How are your vitamins and calcium going?  How are you taking them? Pt states that she is taking her supplements and vitamins without  difficulty.  Reminded patient that the first 30 days post-operatively are important for successful recovery.  Practice good hand hygiene, wearing a mask when appropriate (since optional in most places), and minimizing exposure to people who live outside of the home, especially if they are exhibiting any respiratory, GI, or illness-like symptoms.

## 2021-02-18 ENCOUNTER — Encounter: Payer: BC Managed Care – PPO | Attending: General Surgery | Admitting: Skilled Nursing Facility1

## 2021-02-18 ENCOUNTER — Other Ambulatory Visit: Payer: Self-pay

## 2021-02-18 DIAGNOSIS — E669 Obesity, unspecified: Secondary | ICD-10-CM | POA: Insufficient documentation

## 2021-02-19 NOTE — Progress Notes (Signed)
2 Week Post-Operative Nutrition Class   Patient was seen on 02/18/2021 for Post-Operative Nutrition education at the [facility].    Surgery date: 02/04/2021 Surgery type: sleeve Start weight at [facility]: 204 Weight today: 190.6 pounds Bowel Habits: Every day to every other day no complaints   Body Composition Scale 02/18/2021  Current Body Weight 190.6  Total Body Fat % 39.2  Visceral Fat 10  Fat-Free Mass % 60.7   Total Body Water % 44.8  Muscle-Mass lbs 29.8  BMI 29.8  Body Fat Displacement          Torso  lbs 46.2         Left Leg  lbs 9.2         Right Leg  lbs 9.2         Left Arm  lbs 4.6         Right Arm   lbs 4.6      The following the learning objectives were met by the patient during this course: Identifies Phase 3 (Soft, High Proteins) Dietary Goals and will begin from 2 weeks post-operatively to 2 months post-operatively Identifies appropriate sources of fluids and proteins  Identifies appropriate fat sources and healthy verses unhealthy fat types   States protein recommendations and appropriate sources post-operatively Identifies the need for appropriate texture modifications, mastication, and bite sizes when consuming solids Identifies appropriate fat consumption and sources Identifies appropriate multivitamin and calcium sources post-operatively Describes the need for physical activity post-operatively and will follow MD recommendations States when to call healthcare provider regarding medication questions or post-operative complications   Handouts given during class include: Phase 3A: Soft, High Protein Diet Handout Phase 3 High Protein Meals Healthy Fats   Follow-Up Plan: Patient will follow-up at [facility] in 6 weeks for 2 month post-op nutrition visit for diet advancement per MD.

## 2021-02-24 ENCOUNTER — Telehealth: Payer: Self-pay | Admitting: Skilled Nursing Facility1

## 2021-02-24 NOTE — Telephone Encounter (Signed)
RD called pt to verify fluid intake once starting soft, solid proteins 2 week post-bariatric surgery.   Daily Fluid intake: 55+ oz Daily Protein intake: 60 g Bowel Habits: every 2 days started a probiotic   Concerns/issues:   Advised to do mirialx every day until she has more regular bowel movements

## 2021-04-03 ENCOUNTER — Encounter: Payer: BC Managed Care – PPO | Attending: General Surgery | Admitting: Skilled Nursing Facility1

## 2021-04-03 ENCOUNTER — Other Ambulatory Visit: Payer: Self-pay

## 2021-04-03 DIAGNOSIS — E669 Obesity, unspecified: Secondary | ICD-10-CM | POA: Diagnosis not present

## 2021-04-03 NOTE — Progress Notes (Signed)
Bariatric Nutrition Follow-Up Visit Medical Nutrition Therapy   NUTRITION ASSESSMENT    Surgery date: 02/04/2021 Surgery type: sleeve Start weight at [facility]: 204 Weight today: 182.6 pounds Bowel Habits: Every day to every other day no complaints   Body Composition Scale 02/18/2021 04/03/2021  Current Body Weight 190.6 182.6  Total Body Fat % 39.2 37.9  Visceral Fat 10 10  Fat-Free Mass % 60.7 62   Total Body Water % 44.8 45.5  Muscle-Mass lbs 29.8 29.7  BMI 29.8 34.5  Body Fat Displacement           Torso  lbs 46.2 42.8         Left Leg  lbs 9.2 8.5         Right Leg  lbs 9.2 8.5         Left Arm  lbs 4.6 4.2         Right Arm   lbs 4.6 4.2     Lifestyle & Dietary Hx  Pt states she lost the taste for water so she drinks alkaline water.   Pt states she does struggle to remember to drink throughout the day.  Pt states she does work from home.   Estimated daily fluid intake: 45 oz Estimated daily protein intake: 60+ g Supplements: multi and calcium Current average weekly physical activity: every other day walking half mile  24-Hr Dietary Recall First Meal: eggs or sausage  Snack: p3  Second Meal: protein shake or airfryed chicken Snack:   Third Meal: fish Snack:  Beverages: alkaline water + flavorings   Post-Op Goals/ Signs/ Symptoms Using straws: no Drinking while eating: no Chewing/swallowing difficulties: no Changes in vision: no Changes to mood/headaches: no Hair loss/changes to skin/nails: no Difficulty focusing/concentrating: no Sweating: no Dizziness/lightheadedness: no Palpitations: no  Carbonated/caffeinated beverages: no N/V/D/C/Gas: nono Abdominal pain: no Dumping syndrome: no    NUTRITION DIAGNOSIS  Overweight/obesity ([place]-3.3) related to past poor dietary habits and physical inactivity as evidenced by completed bariatric surgery and following dietary guidelines for continued weight loss and healthy nutrition status.     NUTRITION  INTERVENTION Nutrition counseling (C-1) and education (E-2) to facilitate bariatric surgery goals, including: Diet advancement to the next phase (phase 4) now including none starchy vegetables The importance of consuming adequate calories as well as certain nutrients daily due to the body's need for essential vitamins, minerals, and fats The importance of daily physical activity and to reach a goal of at least 150 minutes of moderate to vigorous physical activity weekly (or as directed by their physician) due to benefits such as increased musculature and improved lab values The importance of intuitive eating specifically learning hunger-satiety cues and understanding the importance of learning a new body: The importance of mindful eating to avoid grazing behaviors   Goals: -Continue to aim for a minimum of 64 fluid ounces 7 days a week with at least 30 ounces being plain water  -Eat non-starchy vegetables 2 times a day 7 days a week  -Start out with soft cooked vegetables today and tomorrow; if tolerated begin to eat raw vegetables or cooked including salads  -Eat your 3 ounces of protein first then start in on your non-starchy vegetables; once you understand how much of your meal leads to satisfaction and not full while still eating 3 ounces of protein and non-starchy vegetables you can eat them in any order   -Continue to aim for 30 minutes of activity at least 5 times a week  -Do NOT cook  with/add to your food: alfredo sauce, cheese sauce, barbeque sauce, ketchup, fat back, butter, bacon grease, grease, Crisco, OR SUGAR  -try liquid calcium  Handouts Provided Include  Phase 4  Learning Style & Readiness for Change Teaching method utilized: Visual & Auditory  Demonstrated degree of understanding via: Teach Back  Readiness Level: action Barriers to learning/adherence to lifestyle change: none identified   RD's Notes for Next Visit Assess adherence to pt chosen goals    MONITORING  & EVALUATION Dietary intake, weekly physical activity, body weight Next Steps Patient is to follow-up in 6 weeks

## 2021-04-08 ENCOUNTER — Ambulatory Visit
Admission: EM | Admit: 2021-04-08 | Discharge: 2021-04-08 | Disposition: A | Discharge status: Home / Self Care | Payer: BC Managed Care – PPO | Attending: Emergency Medicine | Admitting: Emergency Medicine

## 2021-04-08 DIAGNOSIS — J069 Acute upper respiratory infection, unspecified: Secondary | ICD-10-CM | POA: Diagnosis not present

## 2021-04-08 LAB — POCT URINALYSIS DIP (MANUAL ENTRY)
Bilirubin, UA: NEGATIVE
Blood, UA: NEGATIVE
Glucose, UA: NEGATIVE mg/dL
Ketones, POC UA: NEGATIVE mg/dL
Leukocytes, UA: NEGATIVE
Nitrite, UA: NEGATIVE
Protein Ur, POC: NEGATIVE mg/dL
Spec Grav, UA: 1.025 (ref 1.010–1.025)
Urobilinogen, UA: 0.2 E.U./dL
pH, UA: 5.5 (ref 5.0–8.0)

## 2021-04-08 MED ORDER — ONDANSETRON 4 MG PO TBDP: 4.0000 mg | ORAL_TABLET | Freq: Three times a day (TID) | ORAL | 0 refills | Status: DC | PRN

## 2021-04-08 MED ORDER — BENZONATATE 200 MG PO CAPS: 200.0000 mg | ORAL_CAPSULE | Freq: Three times a day (TID) | ORAL | 0 refills | Status: AC | PRN

## 2021-04-08 MED ORDER — FLUTICASONE PROPIONATE 50 MCG/ACT NA SUSP: 1.0000 | Freq: Every day | NASAL | 0 refills | Status: DC

## 2021-04-08 NOTE — ED Triage Notes (Signed)
pt c/o Chills and aches, headache at home covid test was negative, patient requesting to be tested for covid Started: Thursday Gastric Sleeve: June 28th

## 2021-04-08 NOTE — Discharge Instructions (Addendum)
COVID test pending Urine normal, no signs of dehydration Flonase nasal spray 1 to 2 spray in each nostril daily Tessalon every 8 hours for cough Zofran as needed for nausea Please drink plenty of fluids Follow-up if not improving or worsening

## 2021-04-08 NOTE — ED Provider Notes (Signed)
[facility]    CSN: [id number] Arrival date & time: 04/08/21  1251      History   Chief Complaint Chief Complaint  Patient presents with   Chills   Headache    HPI TAALIAH KROUGH is a 35 y.o. female presenting today for evaluation of chills body aches and headache.  Reports over the past 4 days she has had body aches, fatigue and headache.  Reports mild cough congestion and sore throat.  Has had some slight nausea and diarrhea.  Denies any abdominal pain or vomiting.  Denies any loss of blood.  She does express concern over possible dehydration.  Reports recent gastric sleeve surgery June 2022.  HPI  Past Medical History:  Diagnosis Date   Anemia    Elevated liver enzymes    Family history of premature CAD    father   Fibroids    Impaired fasting blood sugar    Miscarriage 2014   Mixed dyslipidemia    Ovarian cyst    Pre-diabetes    Sexually transmitted disease (STD)    hx/o HPV, gonorrhea, chlamydia, genital herpes   Syphilis 2010    Patient Active Problem List   Diagnosis Date Noted   Prediabetes 02/04/2021   Dyslipidemia 02/04/2021   S/P laparoscopic sleeve gastrectomy 02/04/2021   COVID-19 virus infection 09/12/2020   SOB (shortness of breath) 09/12/2020   Body aches 09/12/2020   Encounter for health maintenance examination in adult 05/30/2020   BMI 39.0-39.9,adult 05/30/2020   Acanthosis nigricans 08/18/2019   Elevated liver function tests 06/02/2019   Screening for cervical cancer 06/02/2019   Vaccine counseling 05/05/2019   Uterine leiomyoma    Influenza vaccination declined 05/31/2017   Genital herpes 06/05/2015   History of syphilis 06/05/2015   Screen for STD (sexually transmitted disease) 06/05/2015   Family history of premature CAD 06/05/2015   Mixed dyslipidemia 06/05/2015   Impaired fasting blood sugar 06/05/2015   HPV (human papilloma virus) infection     Past Surgical History:  Procedure Laterality Date   BREAST REDUCTION  SURGERY Bilateral 12/04/2015   Procedure: BILATERAL BREAST REDUCTION  WITH LIPOSUCTION;  Surgeon: Wallace Going, DO;  Location: [facility];  Service: Plastics;  Laterality: Bilateral;   CESAREAN SECTION N/A 02/14/2018   Procedure: CESAREAN SECTION;  Surgeon: Crawford Givens, MD;  Location: [facility];  Service: Obstetrics;  Laterality: N/A;   UPPER GI ENDOSCOPY N/A 02/04/2021   Procedure: UPPER GI ENDOSCOPY;  Surgeon: Greer Pickerel, MD;  Location: [facility];  Service: General;  Laterality: N/A;    OB History     Gravida  2   Para  0   Term  0   Preterm  0   AB  1   Living  0      SAB  1   IAB  0   Ectopic  0   Multiple  0   Live Births               Home Medications    Prior to Admission medications   Medication Sig Start Date End Date Taking? Authorizing Provider  benzonatate (TESSALON) 200 MG capsule Take 1 capsule (200 mg total) by mouth 3 (three) times daily as needed for up to 7 days for cough. 04/08/21 04/15/21 Yes Comfort Iversen C, PA-C  fluticasone (FLONASE) 50 MCG/ACT nasal spray Place 1-2 sprays into both nostrils daily. 04/08/21  Yes Sharalee Witman C, PA-C  ondansetron (ZOFRAN ODT) 4 MG disintegrating  tablet Take 1 tablet (4 mg total) by mouth every 8 (eight) hours as needed for nausea or vomiting. 04/08/21  Yes Efrain Clauson C, PA-C  gabapentin (NEURONTIN) 100 MG capsule Take 2 capsules (200 mg total) by mouth every 12 (twelve) hours. 02/05/21   Greer Pickerel, MD  Multiple Vitamins-Minerals (ADULT ONE DAILY GUMMIES PO) Take 2 tablets by mouth every evening.    [provider]  pantoprazole (PROTONIX) 40 MG tablet Take 1 tablet (40 mg total) by mouth daily. 02/05/21   Greer Pickerel, MD  rosuvastatin (CRESTOR) 10 MG tablet Take 1 tablet (10 mg total) by mouth daily. Patient taking differently: Take 10 mg by mouth every evening. 05/31/20 05/31/21  Tysinger, Camelia Eng, PA-C  traMADol (ULTRAM) 50 MG tablet Take 1 tablet (50 mg total) by  mouth every 6 (six) hours as needed (pain). 02/05/21   Greer Pickerel, MD  valACYclovir (VALTREX) 1000 MG tablet 2 tablets po BID x 1 day for flare up Patient taking differently: Take 2,000 mg by mouth 2 (two) times daily as needed (flare ups). 05/30/20   Tysinger, Camelia Eng, PA-C    Family History Family History  Problem Relation Age of Onset   Heart disease Father 37       died MI   Hypertension Maternal Grandmother    Diabetes Maternal Grandmother    Stroke Maternal Grandmother    Heart disease Maternal Grandmother    Hypertension Maternal Grandfather    COPD Maternal Grandfather    Cancer Maternal Grandfather 45       colon   GER disease Mother    Deep vein thrombosis Mother        with MVA   Heart disease Mother 79       LVH   COPD Paternal Grandfather    Pneumonia Brother    Cancer Maternal Aunt        breast   Cancer Cousin        breast    Social History Social History   Tobacco Use   Smoking status: Never   Smokeless tobacco: Never  Vaping Use   Vaping Use: Never used  Substance Use Topics   Alcohol use: Yes    Alcohol/week: 0.0 standard drinks    Comment: occasionally   Drug use: No     Allergies   Meloxicam, Metformin and related, and Nuvaring [etonogestrel-ethinyl estradiol]   Review of Systems Review of Systems  Constitutional:  Positive for chills and fatigue. Negative for activity change, appetite change and fever.  HENT:  Positive for congestion, rhinorrhea and sore throat. Negative for ear pain, sinus pressure and trouble swallowing.   Eyes:  Negative for discharge and redness.  Respiratory:  Positive for cough. Negative for chest tightness and shortness of breath.   Cardiovascular:  Negative for chest pain.  Gastrointestinal:  Negative for abdominal pain, diarrhea, nausea and vomiting.  Musculoskeletal:  Negative for myalgias.  Skin:  Negative for rash.  Neurological:  Positive for headaches. Negative for dizziness and light-headedness.     Physical Exam Triage Vital Signs ED Triage Vitals  Enc Vitals Group     BP      Pulse      Resp      Temp      Temp src      SpO2      Weight      Height      Head Circumference      Peak Flow  Pain Score      Pain Loc      Pain Edu?      Excl. in [place]?    No data found.  Updated Vital Signs BP 120/83 (BP Location: Left Arm)   Pulse 73   Temp 98.3 F (36.8 C) (Oral)   Resp 18   LMP 03/16/2021   SpO2 99%   Visual Acuity Right Eye Distance:   Left Eye Distance:   Bilateral Distance:    Right Eye Near:   Left Eye Near:    Bilateral Near:     Physical Exam Vitals and nursing note reviewed.  Constitutional:      Appearance: She is well-developed.     Comments: No acute distress  HENT:     Head: Normocephalic and atraumatic.     Ears:     Comments: Bilateral ears without tenderness to palpation of external auricle, tragus and mastoid, EAC's without erythema or swelling, TM's with good bony landmarks and cone of light. Non erythematous.      Nose: Nose normal.     Mouth/Throat:     Comments: Oral mucosa pink and moist, no tonsillar enlargement or exudate. Posterior pharynx patent and nonerythematous, no uvula deviation or swelling. Normal phonation.  Eyes:     Conjunctiva/sclera: Conjunctivae normal.  Cardiovascular:     Rate and Rhythm: Normal rate.  Pulmonary:     Effort: Pulmonary effort is normal. No respiratory distress.     Comments: Breathing comfortably at rest, CTABL, no wheezing, rales or other adventitious sounds auscultated  Abdominal:     General: There is no distension.  Musculoskeletal:        General: Normal range of motion.     Cervical back: Neck supple.  Skin:    General: Skin is warm and dry.  Neurological:     Mental Status: She is alert and oriented to person, place, and time.     UC Treatments / Results  Labs (all labs ordered are listed, but only abnormal results are displayed) Labs Reviewed  NOVEL CORONAVIRUS, NAA   POCT URINALYSIS DIP (MANUAL ENTRY)    EKG   Radiology No results found.  Procedures Procedures (including critical care time)  Medications Ordered in UC Medications - No data to display  Initial Impression / Assessment and Plan / UC Course  I have reviewed the triage vital signs and the nursing notes.  Pertinent labs & imaging results that were available during my care of the patient were reviewed by me and considered in my medical decision making (see chart for details).    Viral URI with cough-suspect symptoms likely viral etiology, will screen with COVID PCR and recommending symptomatic and supportive care.  Urine unremarkable without signs of dehydration, encouraged to push fluids, deferring blood work.  Recommendations provided.  Continue to monitor,Discussed strict return precautions. Patient verbalized understanding and is agreeable with plan.  Final Clinical Impressions(s) / UC Diagnoses   Final diagnoses:  Viral URI with cough     Discharge Instructions      COVID test pending Urine normal, no signs of dehydration Flonase nasal spray 1 to 2 spray in each nostril daily Tessalon every 8 hours for cough Zofran as needed for nausea Please drink plenty of fluids Follow-up if not improving or worsening     ED Prescriptions     Medication Sig Dispense Auth. Provider   ondansetron (ZOFRAN ODT) 4 MG disintegrating tablet Take 1 tablet (4 mg total) by mouth every 8 (eight)  hours as needed for nausea or vomiting. 20 tablet Verne Cove C, PA-C   fluticasone (FLONASE) 50 MCG/ACT nasal spray Place 1-2 sprays into both nostrils daily. 16 g Shaday Rayborn C, PA-C   benzonatate (TESSALON) 200 MG capsule Take 1 capsule (200 mg total) by mouth 3 (three) times daily as needed for up to 7 days for cough. 28 capsule Nyzier Boivin, [place] C, PA-C      PDMP not reviewed this encounter.   Janith Lima, [place] 04/08/21 1456

## 2021-04-09 LAB — NOVEL CORONAVIRUS, NAA: SARS-CoV-2, NAA: DETECTED — AB

## 2021-04-09 LAB — SARS-COV-2, NAA 2 DAY TAT

## 2021-04-23 ENCOUNTER — Telehealth: Payer: BC Managed Care – PPO | Admitting: Family Medicine

## 2021-04-23 ENCOUNTER — Other Ambulatory Visit: Payer: Self-pay

## 2021-04-23 ENCOUNTER — Encounter: Payer: Self-pay | Admitting: Family Medicine

## 2021-04-23 VITALS — Wt 177.0 lb

## 2021-04-23 DIAGNOSIS — R0981 Nasal congestion: Secondary | ICD-10-CM

## 2021-04-23 DIAGNOSIS — R059 Cough, unspecified: Secondary | ICD-10-CM | POA: Diagnosis not present

## 2021-04-23 DIAGNOSIS — J029 Acute pharyngitis, unspecified: Secondary | ICD-10-CM | POA: Diagnosis not present

## 2021-04-23 NOTE — Progress Notes (Signed)
   Subjective:  Documentation for virtual audio and video telecommunications through [place] encounter:  The patient was located at home. 2 patient identifiers used.  The provider was located in the office. The patient did consent to this visit and is aware of possible charges through their insurance for this visit.  The other persons participating in this telemedicine service were none. Time spent on call was 15 minutes and in review of previous records >20 minutes total.  This virtual service is not related to other E/M service within previous 7 days.   Patient ID: Laura Cross, female    DOB: 01-Aug-1986, 35 y.o.   MRN: [id number]  HPI Chief Complaint  Patient presents with   other    Still not feeling well since she had covid, last 3-4 days throat has been hurting and congestion. Still coughing a little bit. Tested positive for covid on few weeks ago.    Complains of a 3 to 4-day history of sore throat.  Denies having any swollen or red tonsils, white patches.  History of strep pharyngitis years ago.  No known exposure recently.  States she had Covid the end of August/early September.  States her daughter was just diagnosed with rhinovirus.   She also reports nasal congestion. Clear to green mucus. Coughing up green mucous in the morning and states she did have some red tinges a couple of mornings ago.  Denies fever, chills, headache, sinus pressure, sinus tenderness, ear pain, chest pain, palpitations, shortness of breath, nausea, vomiting or diarrhea.  Using Flonase. Alka Seltzer daytime and nighttime.   Does not smoke. No recent antibiotics   Reviewed allergies, medications, past medical, surgical, family, and social history.     Review of Systems Pertinent positives and negatives in the history of present illness.      Objective:   Physical Exam Wt 177 lb (80.3 kg)   BMI 33.44 kg/m   Alert and oriented and in no acute distress.  Respirations unlabored.   Speaking in complete sentences without difficulty.  Nontender sinuses when she palpates them.  Normal speech      Assessment & Plan:  Acute pharyngitis, unspecified etiology  Cough  Nasal congestion  Discussed that she appears to have a viral illness and symptoms speak against strep pharyngitis.  Discussed symptomatic management.  If she develops fever, worsening pain or any new symptoms, she will let me know.  I will have a low threshold to prescribe an antibiotic since she recently had COVID but she does report all of those symptoms had resolved prior to this illness.

## 2021-05-06 ENCOUNTER — Ambulatory Visit: Payer: BC Managed Care – PPO | Admitting: Medical

## 2021-05-06 ENCOUNTER — Other Ambulatory Visit: Payer: Self-pay

## 2021-05-06 VITALS — BP 110/70 | HR 68 | Temp 98.9°F | Wt 178.0 lb

## 2021-05-06 DIAGNOSIS — J029 Acute pharyngitis, unspecified: Secondary | ICD-10-CM | POA: Diagnosis not present

## 2021-05-06 DIAGNOSIS — H938X3 Other specified disorders of ear, bilateral: Secondary | ICD-10-CM

## 2021-05-06 DIAGNOSIS — H6123 Impacted cerumen, bilateral: Secondary | ICD-10-CM | POA: Diagnosis not present

## 2021-05-06 DIAGNOSIS — Z8616 Personal history of COVID-19: Secondary | ICD-10-CM

## 2021-05-06 MED ORDER — AMOXICILLIN 875 MG PO TABS: 875.0000 mg | ORAL_TABLET | Freq: Two times a day (BID) | ORAL | 0 refills | Status: AC

## 2021-05-06 NOTE — Progress Notes (Signed)
Subjective:  Laura Cross is a 35 y.o. female who presents for Chief Complaint  Patient presents with   Sore Throat    Sore throat. Had virtual with vickie last week. Had covid and URI about a month ago. 2 weeks of throat pain.      Here for sore throat.  She had COVID a month ago.  Most of the initial symptoms resolved but she has continued to have a sore throat lingering for several weeks now.  She also feels some pressure in her ears and when she swallows she can feel some pressure in the right ear.  Her current symptoms are mainly sore throat and ear pressure.  No current fever, bodies, chills, nausea, vomiting, diarrhea.  She has occasional cough with the mucus and phlegm.  She is using salt water gargles, hot tea, Flonase, Tylenol.  Otherwise normal state of health.  No other aggravating or relieving factors.    No other c/o.  The following portions of the patient's history were reviewed and updated as appropriate: allergies, current medications, past family history, past medical history, past social history, past surgical history and problem list.  ROS Otherwise as in subjective above  Objective: BP 110/70   Pulse 68   Temp 98.9 F (37.2 C)   Wt 178 lb (80.7 kg)   BMI 33.63 kg/m   General appearance: alert, no distress, well developed, well nourished HEENT: normocephalic, sclerae anicteric, conjunctiva pink and moist, impacted cerumen bilaterally cannot visualize TMs, nares patent, no discharge or erythema, pharynx with mild erythema Oral cavity: MMM, no lesions Neck: supple, no lymphadenopathy, no thyromegaly, no masses Heart: RRR, normal S1, S2, no murmurs Lungs: CTA bilaterally, no wheezes, rhonchi, or rales Pulses: 2+ radial pulses, 2+ pedal pulses, normal cap refill Ext: no edema   Assessment: Encounter Diagnoses  Name Primary?   Sore throat Yes   Pressure sensation in both ears    History of COVID-19    Impacted cerumen of both ears      Plan: Symptoms  and exam suggest eustachian tube dysfunction.  Given the timeframe of symptoms, we will do a round of amoxicillin.  Advised her to continue rest, hydration, salt water gargles, warm fluids, use ibuprofen or Tylenol for the next few days for pain as needed.  If not much improved by the end of the week then call or recheck  Discussed findings.  Discussed risk/benefits of procedure and patient agrees to procedure. Successfully used warm water lavage to remove impacted cerumen from bilat ear canal. Patient tolerated procedure well. Advised they avoid using any cotton swabs or other devices to clean the ear canals.  Use basic hygiene as discussed.  Follow up prn.   Laura Cross was seen today for sore throat.  Diagnoses and all orders for this visit:  Sore throat  Pressure sensation in both ears  History of COVID-19  Impacted cerumen of both ears  Other orders -     amoxicillin (AMOXIL) 875 MG tablet; Take 1 tablet (875 mg total) by mouth 2 (two) times daily for 10 days.   Follow up: prn

## 2021-05-09 ENCOUNTER — Telehealth: Payer: Self-pay | Admitting: Medical

## 2021-05-09 NOTE — Telephone Encounter (Signed)
Nurse case manager from [facility] called, she has been having regular follow up calls with the patient and  Patient had advised her that she has not taken rosuvastatin rx since June before her surgery. Case manager advised Her to make you aware and then her last follow up she asked again and pt advised her that she had not informed you. Case manager wanted to make you aware

## 2021-05-13 ENCOUNTER — Other Ambulatory Visit: Payer: Self-pay

## 2021-05-13 ENCOUNTER — Encounter: Payer: BC Managed Care – PPO | Attending: General Surgery | Admitting: Skilled Nursing Facility1

## 2021-05-13 DIAGNOSIS — E669 Obesity, unspecified: Secondary | ICD-10-CM | POA: Insufficient documentation

## 2021-05-13 NOTE — Progress Notes (Signed)
Bariatric Nutrition Follow-Up Visit Medical Nutrition Therapy   NUTRITION ASSESSMENT    Surgery date: 02/04/2021 Surgery type: sleeve Start weight at [facility]: 204 Weight today: 175.6 pounds   Body Composition Scale 02/18/2021 04/03/2021 05/13/2021  Current Body Weight 190.6 182.6 175.6  Total Body Fat % 39.2 37.9 36.7  Visceral Fat 10 10 9   Fat-Free Mass % 60.7 62 63.2   Total Body Water % 44.8 45.5 46.1  Muscle-Mass lbs 29.8 29.7 29.7  BMI 29.8 34.5 33.2  Body Fat Displacement            Torso  lbs 46.2 42.8 39.9         Left Leg  lbs 9.2 8.5 7.9         Right Leg  lbs 9.2 8.5 7.9         Left Arm  lbs 4.6 4.2 3.9         Right Arm   lbs 4.6 4.2 3.9     Lifestyle & Dietary Hx   Pt states she has gotten into grazing and definitely wants to stop that. Pt states she has also had some foods she was thinking she probably should not have such as oreos. So has been grazing on calorically dense foods. Pt states she is realizing she needs to go back to their therapist.  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   Estimated daily fluid intake: 45 oz Estimated daily protein intake: 60+ g Supplements: multi and calcium Current average weekly physical activity: ADL's  24-Hr Dietary Recall First Meal: eggs or sausage or protein shake Snack: p3  Second Meal: protein shake or airfryed chicken or chili Snack:  oreos  Third Meal: salmon + zucchini Snack:  Beverages: alkaline water + flavorings, Gatorade zero  Post-Op Goals/ Signs/ Symptoms Using straws: no Drinking while eating: no Chewing/swallowing difficulties: no Changes in vision: no Changes to mood/headaches: no Hair loss/changes to skin/nails: no Difficulty focusing/concentrating: no Sweating: no Dizziness/lightheadedness: no Palpitations: no  Carbonated/caffeinated beverages: no N/V/D/C/Gas: nono Abdominal pain: no Dumping syndrome: no    NUTRITION DIAGNOSIS  Overweight/obesity ([place]-3.3)  related to past poor dietary habits and physical inactivity as evidenced by completed bariatric surgery and following dietary guidelines for continued weight loss and healthy nutrition status.     NUTRITION INTERVENTION Nutrition counseling (C-1) and education (E-2) to facilitate bariatric surgery goals, including: Diet advancement to the next phase (phase 5) now including starchy vegetables The importance of consuming adequate calories as well as certain nutrients daily due to the body's need for essential vitamins, minerals, and fats The importance of daily physical activity and to reach a goal of at least 150 minutes of moderate to vigorous physical activity weekly (or as directed by their physician) due to benefits such as increased musculature and improved lab values The importance of intuitive eating specifically learning hunger-satiety cues and understanding the importance of learning a new body: The importance of mindful eating to avoid grazing behaviors   Goals: -have your partner not buy snaky foods such as Oreos for one month while you work on control with these foods -increase your activity throughout the week -maybe think of the gym as an appt you can't break   Handouts Provided Include  Phase 5  Learning Style & Readiness for Change Teaching method utilized: Visual & Auditory  Demonstrated degree of understanding via: Teach Back  Readiness Level: action Barriers to learning/adherence to lifestyle change: none identified   RD's Notes  for Next Visit Assess adherence to pt chosen goals    MONITORING & EVALUATION Dietary intake, weekly physical activity, body weight Next Steps Patient is to follow-up in 2 months

## 2021-06-04 ENCOUNTER — Ambulatory Visit (INDEPENDENT_AMBULATORY_CARE_PROVIDER_SITE_OTHER): Payer: BC Managed Care – PPO | Admitting: Medical

## 2021-06-04 ENCOUNTER — Other Ambulatory Visit: Payer: Self-pay

## 2021-06-04 ENCOUNTER — Encounter: Payer: Self-pay | Admitting: Medical

## 2021-06-04 VITALS — BP 120/50 | HR 73 | Ht 60.75 in | Wt 175.2 lb

## 2021-06-04 DIAGNOSIS — Z0000 Encounter for general adult medical examination without abnormal findings: Secondary | ICD-10-CM

## 2021-06-04 DIAGNOSIS — D649 Anemia, unspecified: Secondary | ICD-10-CM | POA: Insufficient documentation

## 2021-06-04 DIAGNOSIS — E782 Mixed hyperlipidemia: Secondary | ICD-10-CM

## 2021-06-04 DIAGNOSIS — D259 Leiomyoma of uterus, unspecified: Secondary | ICD-10-CM | POA: Diagnosis not present

## 2021-06-04 DIAGNOSIS — Z9884 Bariatric surgery status: Secondary | ICD-10-CM

## 2021-06-04 DIAGNOSIS — R7301 Impaired fasting glucose: Secondary | ICD-10-CM | POA: Diagnosis not present

## 2021-06-04 DIAGNOSIS — Z113 Encounter for screening for infections with a predominantly sexual mode of transmission: Secondary | ICD-10-CM

## 2021-06-04 DIAGNOSIS — Z6833 Body mass index (BMI) 33.0-33.9, adult: Secondary | ICD-10-CM

## 2021-06-04 DIAGNOSIS — L83 Acanthosis nigricans: Secondary | ICD-10-CM

## 2021-06-04 DIAGNOSIS — Z2821 Immunization not carried out because of patient refusal: Secondary | ICD-10-CM

## 2021-06-04 DIAGNOSIS — R7303 Prediabetes: Secondary | ICD-10-CM | POA: Diagnosis not present

## 2021-06-04 DIAGNOSIS — A6000 Herpesviral infection of urogenital system, unspecified: Secondary | ICD-10-CM

## 2021-06-04 DIAGNOSIS — Z8249 Family history of ischemic heart disease and other diseases of the circulatory system: Secondary | ICD-10-CM

## 2021-06-04 DIAGNOSIS — Z7185 Encounter for immunization safety counseling: Secondary | ICD-10-CM

## 2021-06-04 NOTE — Progress Notes (Signed)
Subjective:   HPI  Laura Cross is a 35 y.o. female who presents for Chief Complaint  Patient presents with   fasting cpe    Fasting cpe, no cocerns, obygn-[place]. Declines flu shot and covid shot     Patient Care Team: Melvine Julin, Leward Quan as PCP - General (Family Medicine) Sees dentist Sees eye doctor Dr. Greer Pickerel,  Carlena Hurl, PA, [facility] [place] [facility], bariatrics [place], [place]  Concerns: Declines flu shot  Regarding anemia in 01/2021, was prescribed iron by surgery but she notes using this for less than a month.  No current heavy periods, no other noted bleeding or bruising.  Declines flu shot  Hx/o herpes - using Valtrex prn, flare up maybe every 3 + months.  Doesn't want to use daily preventative therapy  Not currently on lipid medication since her surgery for gastric bypass  No other c/o   Reviewed their medical, surgical, family, social, medication, and allergy history and updated chart as appropriate.  Past Medical History:  Diagnosis Date   Anemia    Elevated liver enzymes    Family history of premature CAD    father   Fibroids    Impaired fasting blood sugar    Miscarriage 2014   Mixed dyslipidemia    Ovarian cyst    Pre-diabetes    Sexually transmitted disease (STD)    hx/o HPV, gonorrhea, chlamydia, genital herpes   Syphilis 2010    Family History  Problem Relation Age of Onset   Heart disease Father 21       died MI   Hypertension Maternal Grandmother    Diabetes Maternal Grandmother    Stroke Maternal Grandmother    Heart disease Maternal Grandmother    Hypertension Maternal Grandfather    COPD Maternal Grandfather    Cancer Maternal Grandfather 32       colon   GER disease Mother    Deep vein thrombosis Mother        with MVA   Heart disease Mother 36       LVH   COPD Paternal Grandfather    Pneumonia Brother    Cancer Maternal Aunt        breast   Cancer Cousin        breast     Current  Outpatient Medications:    calcium gluconate 500 MG tablet, Take 1 tablet by mouth 3 (three) times daily., Disp: , Rfl:    fluticasone (FLONASE) 50 MCG/ACT nasal spray, Place 1-2 sprays into both nostrils daily., Disp: 16 g, Rfl: 0   Multiple Vitamins-Minerals (ADULT ONE DAILY GUMMIES PO), Take 2 tablets by mouth every evening., Disp: , Rfl:    pantoprazole (PROTONIX) 40 MG tablet, Take 40 mg by mouth daily., Disp: , Rfl:    valACYclovir (VALTREX) 1000 MG tablet, 2 tablets po BID x 1 day for flare up (Patient taking differently: Take 2,000 mg by mouth 2 (two) times daily as needed (flare ups).), Disp: 20 tablet, Rfl: 2   rosuvastatin (CRESTOR) 10 MG tablet, Take 1 tablet (10 mg total) by mouth daily. (Patient not taking: No sig reported), Disp: 90 tablet, Rfl: 3  Allergies  Allergen Reactions   Meloxicam Other (See Comments)    Bleeding?  Can tolerate other NSAIDs   Metformin And Related Diarrhea and Other (See Comments)    hair falling out   Nuvaring [Etonogestrel-Ethinyl Estradiol] Other (See Comments)    Flu-like symptoms     Review of Systems  Constitutional: -fever, -chills, -sweats, -unexpected weight change, -decreased appetite, -fatigue Allergy: -sneezing, -itching, -congestion Dermatology: -changing moles, --rash, -lumps ENT: -runny nose, -ear pain, -sore throat, -hoarseness, -sinus pain, -teeth pain, - ringing in ears, -hearing loss, -nosebleeds Cardiology: -chest pain, -palpitations, -swelling, -difficulty breathing when lying flat, -waking up short of breath Respiratory: -cough, -shortness of breath, -difficulty breathing with exercise or exertion, -wheezing, -coughing up blood Gastroenterology: -abdominal pain, -nausea, -vomiting, -diarrhea, -constipation, -blood in stool, -changes in bowel movement, -difficulty swallowing or eating Hematology: -bleeding, -bruising  Musculoskeletal: -joint aches, -muscle aches, -joint swelling, -back pain, -neck pain, -cramping, -changes in  gait Ophthalmology: denies vision changes, eye redness, itching, discharge Urology: -burning with urination, -difficulty urinating, -blood in urine, -urinary frequency, -urgency, -incontinence Neurology: -headache, -weakness, -tingling, -numbness, -memory loss, -falls, -dizziness Psychology: -depressed mood, -agitation, -sleep problems Breast/gyn: -breast tendnerss, -discharge, -lumps, -vaginal discharge,- irregular periods, -heavy periods   Depression screen [facility] 2/9 06/04/2021 05/06/2021 05/30/2020 10/18/2019 05/05/2019  Decreased Interest 0 0 0 0 0  Down, Depressed, Hopeless 0 0 0 0 0  PHQ - 2 Score 0 0 0 0 0  Altered sleeping - - 0 - -  Tired, decreased energy - - 0 - -  Change in appetite - - 0 - -  Feeling bad or failure about yourself  - - 0 - -  Trouble concentrating - - 0 - -  Moving slowly or fidgety/restless - - 0 - -  Suicidal thoughts - - 0 - -  PHQ-9 Score - - 0 - -       Objective:  BP (!) 120/50   Pulse 73   Ht 5' 0.75" (1.543 m)   Wt 175 lb 3.2 oz (79.5 kg)   BMI 33.38 kg/m   General appearance: alert, no distress, WD/WN, African American female Skin: unremarkable, tattoos right upper chest, right upper arm, darker coloration around neck c/w acanthosis nigricans HEENT: normocephalic, conjunctiva/corneas normal, sclerae anicteric, PERRLA, EOMi Neck: supple, no lymphadenopathy, no thyromegaly, no masses, normal ROM, no bruits Chest: non tender, normal shape and expansion Heart: RRR, normal S1, S2, no murmurs Lungs: CTA bilaterally, no wheezes, rhonchi, or rales Abdomen: +bs, soft, non tender, non distended, no masses, no hepatomegaly, no splenomegaly, no bruits Back: non tender, normal ROM, no scoliosis Musculoskeletal: upper extremities non tender, no obvious deformity, normal ROM throughout, lower extremities non tender, no obvious deformity, normal ROM throughout Extremities: no edema, no cyanosis, no clubbing Pulses: 2+ symmetric, upper and lower extremities,  normal cap refill Neurological: alert, oriented x 3, CN2-12 intact, strength normal upper extremities and lower extremities, sensation normal throughout, DTRs 2+ throughout, no cerebellar signs, gait normal Psychiatric: normal affect, behavior normal, pleasant  Breast/gyn/rectal - deferred to gynecology    Assessment and Plan :   Encounter Diagnoses  Name Primary?   Encounter for health maintenance examination in adult Yes   Vaccine counseling    Uterine leiomyoma, unspecified location    S/P laparoscopic sleeve gastrectomy    Prediabetes    Mixed dyslipidemia    Influenza vaccination declined    Impaired fasting blood sugar    Genital herpes simplex, unspecified site    Elevated liver function tests    Family history of premature CAD    Acanthosis nigricans    BMI 33.0-33.9,adult    Screen for STD (sexually transmitted disease)    Anemia, unspecified type      This visit was a preventative care visit, also known as wellness visit or routine physical.  Topics typically include healthy lifestyle, diet, exercise, preventative care, vaccinations, sick and well care, proper use of emergency dept and after hours care, as well as other concerns.     Recommendations: Continue to return yearly for your annual wellness and preventative care visits.  This gives [name] a chance to discuss healthy lifestyle, exercise, vaccinations, review your chart record, and perform screenings where appropriate.  I recommend you see your eye doctor yearly for routine vision care.  I recommend you see your dentist yearly for routine dental care including hygiene visits twice yearly.   Vaccination recommendations were reviewed Immunization History  Administered Date(s) Administered   HPV Quadrivalent 04/27/2012, 06/27/2012   PFIZER Comirnaty(Gray Top)Covid-19 Tri-Sucrose Vaccine 11/12/2020, 12/10/2020   Td 06/05/2015   Tdap 01/21/2018    You declined flu vaccine today   Screening for  cancer: Colon cancer screening: Age 64  Breast cancer screening: You should perform a self breast exam monthly.   We reviewed recommendations for regular mammograms and breast cancer screening.  Cervical cancer screening: We reviewed recommendations for pap smear screening.   Skin cancer screening: Check your skin regularly for new changes, growing lesions, or other lesions of concern Come in for evaluation if you have skin lesions of concern.  Lung cancer screening: If you have a greater than 20 pack year history of tobacco use, then you may qualify for lung cancer screening with a chest CT scan.   Please call your insurance company to inquire about coverage for this test.  We currently don't have screenings for other cancers besides breast, cervical, colon, and lung cancers.  If you have a strong family history of cancer or have other cancer screening concerns, please let me know.    Bone health: Get at least 150 minutes of aerobic exercise weekly Get weight bearing exercise at least once weekly Bone density test:  A bone density test is an imaging test that uses a type of X-ray to measure the amount of calcium and other minerals in your bones. The test may be used to diagnose or screen you for a condition that causes weak or thin bones (osteoporosis), predict your risk for a broken bone (fracture), or determine how well your osteoporosis treatment is working. The bone density test is recommended for females 57 and older, or females or males <03 if certain risk factors such as thyroid disease, long term use of steroids such as for asthma or rheumatological issues, vitamin D deficiency, estrogen deficiency, family history of osteoporosis, self or family history of fragility fracture in first degree relative.    Heart health: Get at least 150 minutes of aerobic exercise weekly Limit alcohol It is important to maintain a healthy blood pressure and healthy cholesterol numbers  Heart  disease screening: Screening for heart disease includes screening for blood pressure, fasting lipids, glucose/diabetes screening, BMI height to weight ratio, reviewed of smoking status, physical activity, and diet.    Goals include blood pressure 120/80 or less, maintaining a healthy lipid/cholesterol profile, preventing diabetes or keeping diabetes numbers under good control, not smoking or using tobacco products, exercising most days per week or at least 150 minutes per week of exercise, and eating healthy variety of fruits and vegetables, healthy oils, and avoiding unhealthy food choices like fried food, fast food, high sugar and high cholesterol foods.    Other tests may possibly include EKG test, CT coronary calcium score, echocardiogram, exercise treadmill stress test.    Medical care options: I recommend you continue to  seek care here first for routine care.  We try really hard to have available appointments Monday through Friday daytime hours for sick visits, acute visits, and physicals.  Urgent care should be used for after hours and weekends for significant issues that cannot wait till the next day.  The emergency department should be used for significant potentially life-threatening emergencies.  The emergency department is expensive, can often have long wait times for less significant concerns, so try to utilize primary care, urgent care, or telemedicine when possible to avoid unnecessary trips to the emergency department.  Virtual visits and telemedicine have been introduced since the pandemic started in 2020, and can be convenient ways to receive medical care.  We offer virtual appointments as well to assist you in a variety of options to seek medical care.    Separate significant issues discussed: Obesity-status post sleeve gastric bypass in June.  She has lost weight and done well with this change.  Continue with regular exercise and healthy diet  Prediabetes, impaired glucose-updated  labs today  Prior dyslipidemia-updated labs today fasting  Anemia on prior labs this year-updated labs today and possible other evaluation pending labs  Genital herpes-uses Valtrex prn    Diara was seen today for fasting cpe.  Diagnoses and all orders for this visit:  Encounter for health maintenance examination in adult -     CBC with Differential/Platelet -     Lipid panel -     Hemoglobin A1c -     Iron, TIBC and Ferritin Panel -     Vitamin B12 -     Folate -     Urinalysis, Routine w reflex microscopic -     Basic metabolic panel -     RPR+HIV+GC+CT Panel -     Hepatitis C antibody -     Hepatitis B surface antigen  Vaccine counseling  Uterine leiomyoma, unspecified location  S/P laparoscopic sleeve gastrectomy -     CBC with Differential/Platelet -     Iron, TIBC and Ferritin Panel -     Vitamin B12 -     Folate  Prediabetes -     Hemoglobin A1c  Mixed dyslipidemia -     Lipid panel  Influenza vaccination declined  Impaired fasting blood sugar -     Hemoglobin A1c  Genital herpes simplex, unspecified site  Elevated liver function tests  Family history of premature CAD  Acanthosis nigricans  BMI 33.0-33.9,adult  Screen for STD (sexually transmitted disease) -     RPR+HIV+GC+CT Panel -     Hepatitis C antibody -     Hepatitis B surface antigen  Anemia, unspecified type -     CBC with Differential/Platelet -     Iron, TIBC and Ferritin Panel -     Vitamin B12 -     Folate   Follow-up pending labs, yearly for physical

## 2021-06-05 ENCOUNTER — Other Ambulatory Visit: Payer: Self-pay | Admitting: Medical

## 2021-06-05 MED ORDER — TANDEM 162-115.2 MG PO CAPS: 1.0000 | ORAL_CAPSULE | Freq: Every day | ORAL | 1 refills | Status: DC

## 2021-06-05 MED ORDER — PANTOPRAZOLE SODIUM 40 MG PO TBEC: 40.0000 mg | DELAYED_RELEASE_TABLET | Freq: Every day | ORAL | 0 refills | Status: DC

## 2021-06-05 MED ORDER — EZETIMIBE-SIMVASTATIN 10-20 MG PO TABS: 1.0000 | ORAL_TABLET | Freq: Every day | ORAL | 3 refills | Status: DC

## 2021-06-05 MED ORDER — VALACYCLOVIR HCL 1 G PO TABS: 2000.0000 mg | ORAL_TABLET | Freq: Two times a day (BID) | ORAL | 3 refills | Status: AC | PRN

## 2021-06-06 LAB — URINALYSIS, ROUTINE W REFLEX MICROSCOPIC
Bilirubin, UA: NEGATIVE
Glucose, UA: NEGATIVE
Ketones, UA: NEGATIVE
Leukocytes,UA: NEGATIVE
Nitrite, UA: NEGATIVE
Protein,UA: NEGATIVE
RBC, UA: NEGATIVE
Specific Gravity, UA: 1.024 (ref 1.005–1.030)
Urobilinogen, Ur: 0.2 mg/dL (ref 0.2–1.0)
pH, UA: 6 (ref 5.0–7.5)

## 2021-06-06 LAB — RPR+HIV+GC+CT PANEL
Chlamydia trachomatis, NAA: NEGATIVE
HIV Screen 4th Generation wRfx: NONREACTIVE
Neisseria Gonorrhoeae by PCR: NEGATIVE
RPR Ser Ql: NONREACTIVE

## 2021-06-06 LAB — CBC WITH DIFFERENTIAL/PLATELET
Basophils Absolute: 0 10*3/uL (ref 0.0–0.2)
Basos: 1 %
EOS (ABSOLUTE): 0.1 10*3/uL (ref 0.0–0.4)
Eos: 2 %
Hematocrit: 35 % (ref 34.0–46.6)
Hemoglobin: 11.1 g/dL (ref 11.1–15.9)
Immature Grans (Abs): 0 10*3/uL (ref 0.0–0.1)
Immature Granulocytes: 0 %
Lymphocytes Absolute: 1.5 10*3/uL (ref 0.7–3.1)
Lymphs: 40 %
MCH: 24.7 pg — ABNORMAL LOW (ref 26.6–33.0)
MCHC: 31.7 g/dL (ref 31.5–35.7)
MCV: 78 fL — ABNORMAL LOW (ref 79–97)
Monocytes Absolute: 0.4 10*3/uL (ref 0.1–0.9)
Monocytes: 10 %
Neutrophils Absolute: 1.7 10*3/uL (ref 1.4–7.0)
Neutrophils: 47 %
Platelets: 304 10*3/uL (ref 150–450)
RBC: 4.5 x10E6/uL (ref 3.77–5.28)
RDW: 15.6 % — ABNORMAL HIGH (ref 11.7–15.4)
WBC: 3.7 10*3/uL (ref 3.4–10.8)

## 2021-06-06 LAB — LIPID PANEL
Chol/HDL Ratio: 4.9 ratio — ABNORMAL HIGH (ref 0.0–4.4)
Cholesterol, Total: 235 mg/dL — ABNORMAL HIGH (ref 100–199)
HDL: 48 mg/dL (ref >39)
LDL Chol Calc (NIH): 157 mg/dL — ABNORMAL HIGH (ref 0–99)
Triglycerides: 167 mg/dL — ABNORMAL HIGH (ref 0–149)
VLDL Cholesterol Cal: 30 mg/dL (ref 5–40)

## 2021-06-06 LAB — HEMOGLOBIN A1C
Est. average glucose Bld gHb Est-mCnc: 117 mg/dL
Hgb A1c MFr Bld: 5.7 % — ABNORMAL HIGH (ref 4.8–5.6)

## 2021-06-06 LAB — HEPATITIS C ANTIBODY: Hep C Virus Ab: <0.1 s/co ratio (ref 0.0–0.9)

## 2021-06-06 LAB — BASIC METABOLIC PANEL
BUN/Creatinine Ratio: 18 (ref 9–23)
BUN: 14 mg/dL (ref 6–20)
CO2: 21 mmol/L (ref 20–29)
Calcium: 9.2 mg/dL (ref 8.7–10.2)
Chloride: 102 mmol/L (ref 96–106)
Creatinine, Ser: 0.79 mg/dL (ref 0.57–1.00)
Glucose: 76 mg/dL (ref 70–99)
Potassium: 4.3 mmol/L (ref 3.5–5.2)
Sodium: 136 mmol/L (ref 134–144)
eGFR: 101 mL/min/{1.73_m2} (ref >59)

## 2021-06-06 LAB — IRON,TIBC AND FERRITIN PANEL
Ferritin: 9 ng/mL — ABNORMAL LOW (ref 15–150)
Iron Saturation: 9 % — CL (ref 15–55)
Iron: 35 ug/dL (ref 27–159)
Total Iron Binding Capacity: 405 ug/dL (ref 250–450)
UIBC: 370 ug/dL (ref 131–425)

## 2021-06-06 LAB — FOLATE: Folate: 14.5 ng/mL (ref >3.0)

## 2021-06-06 LAB — VITAMIN B12: Vitamin B-12: 769 pg/mL (ref 232–1245)

## 2021-06-06 LAB — HEPATITIS B SURFACE ANTIGEN: Hepatitis B Surface Ag: NEGATIVE

## 2021-07-17 ENCOUNTER — Other Ambulatory Visit (HOSPITAL_COMMUNITY): Payer: Self-pay

## 2021-07-23 ENCOUNTER — Encounter: Payer: BC Managed Care – PPO | Attending: General Surgery | Admitting: Skilled Nursing Facility1

## 2021-07-23 ENCOUNTER — Other Ambulatory Visit: Payer: Self-pay

## 2021-07-23 DIAGNOSIS — E669 Obesity, unspecified: Secondary | ICD-10-CM | POA: Insufficient documentation

## 2021-07-23 NOTE — Progress Notes (Signed)
Bariatric Nutrition Follow-Up Visit Medical Nutrition Therapy   NUTRITION ASSESSMENT    Surgery date: 02/04/2021 Surgery type: sleeve Start weight at [facility]: 204 Weight today: 175.6 pounds   Body Composition Scale 02/18/2021 04/03/2021 05/13/2021 07/23/2021  Current Body Weight 190.6 182.6 175.6 174.6  Total Body Fat % 39.2 37.9 36.7 37.3  Visceral Fat 10 10 9 9   Fat-Free Mass % 60.7 62 63.2 62.6   Total Body Water % 44.8 45.5 46.1 45.8  Muscle-Mass lbs 29.8 29.7 29.7 29  BMI 29.8 34.5 33.2 33.8  Body Fat Displacement             Torso  lbs 46.2 42.8 39.9 40.2         Left Leg  lbs 9.2 8.5 7.9 8         Right Leg  lbs 9.2 8.5 7.9 8         Left Arm  lbs 4.6 4.2 3.9 4         Right Arm   lbs 4.6 4.2 3.9 4     Lifestyle & Dietary Hx  Pt states she feels better than last time stating she has lost inches but the grazing is still happening. Pt states she was started on a statin.  Pt states she is thinking she was having silent reflux due to feeling she had throat pain and then after taking the antacid feeling better.   Pt states she is recognizing she needs to get back in with her therapist.   Labs: iron saturation 9, ferritin 9, MC 78, MCH 24.7, RDW 15.6, A1C 5.7, cholesterol 235, triglycerides 167  Pt state she started a physical activity routine.   Estimated daily fluid intake: 45 oz Estimated daily protein intake: 60+ g Supplements: multi and calcium and 65 mg iron Current average weekly physical activity: dance class 1-2 times a week  24-Hr Dietary Recall First Meal: eggs or sausage or protein shake Snack: p3 or bacon and eggs + multi grain thin bread + jelly Second Meal: protein shake or airfryed chicken or chili or salad and chicken wings Snack:  oreos  Third Meal: salmon + zucchini or salad: tomatoes, onions, cucmber + wings Snack:  Beverages: alkaline water + flavorings, Gatorade zero, coffee  Post-Op Goals/ Signs/ Symptoms Using straws: no Drinking while  eating: no Chewing/swallowing difficulties: no Changes in vision: no Changes to mood/headaches: no Hair loss/changes to skin/nails: no Difficulty focusing/concentrating: no Sweating: no Dizziness/lightheadedness: no Palpitations: no  Carbonated/caffeinated beverages: no N/V/D/C/Gas: nono Abdominal pain: no Dumping syndrome: no    NUTRITION DIAGNOSIS  Overweight/obesity ([place]-3.3) related to past poor dietary habits and physical inactivity as evidenced by completed bariatric surgery and following dietary guidelines for continued weight loss and healthy nutrition status.     NUTRITION INTERVENTION Nutrition counseling (C-1) and education (E-2) to facilitate bariatric surgery goals, including: The importance of consuming adequate calories as well as certain nutrients daily due to the body's need for essential vitamins, minerals, and fats The importance of daily physical activity and to reach a goal of at least 150 minutes of moderate to vigorous physical activity weekly (or as directed by their physician) due to benefits such as increased musculature and improved lab values The importance of intuitive eating specifically learning hunger-satiety cues and understanding the importance of learning a new body: The importance of mindful eating to avoid grazing behaviors  Importance of vegetables To have an overall healthy diet, adult men and women are recommended to consume anywhere from 2-3  cups of vegetables daily. Vegetables provide a wide range of vitamins and minerals such as vitamin A, vitamin C, potassium, and folic acid. According to the [facility], including fruit and vegetables daily may reduce the risk of cardiovascular disease, certain cancers, and other non-communicable diseases. A low fat, low cholesterol is discussed with the patient  Handouts Provided Include  Phase 7  Learning Style & Readiness for Change Teaching method utilized: Visual & Auditory  Demonstrated  degree of understanding via: Teach Back  Readiness Level: action Barriers to learning/adherence to lifestyle change: none identified   RD's Notes for Next Visit Assess adherence to pt chosen goals    MONITORING & EVALUATION Dietary intake, weekly physical activity, body weight Next Steps Patient is to follow-up in march

## 2021-07-24 DIAGNOSIS — Z9884 Bariatric surgery status: Secondary | ICD-10-CM | POA: Diagnosis not present

## 2021-07-24 DIAGNOSIS — Z8639 Personal history of other endocrine, nutritional and metabolic disease: Secondary | ICD-10-CM | POA: Diagnosis not present

## 2021-07-24 DIAGNOSIS — R7303 Prediabetes: Secondary | ICD-10-CM | POA: Diagnosis not present

## 2021-07-24 DIAGNOSIS — E669 Obesity, unspecified: Secondary | ICD-10-CM | POA: Diagnosis not present

## 2021-08-26 DIAGNOSIS — N898 Other specified noninflammatory disorders of vagina: Secondary | ICD-10-CM | POA: Diagnosis not present

## 2021-08-26 DIAGNOSIS — Z01419 Encounter for gynecological examination (general) (routine) without abnormal findings: Secondary | ICD-10-CM | POA: Diagnosis not present

## 2021-09-09 ENCOUNTER — Other Ambulatory Visit: Payer: Self-pay

## 2021-09-09 ENCOUNTER — Ambulatory Visit: Payer: BC Managed Care – PPO | Admitting: Medical

## 2021-09-09 VITALS — BP 120/80 | HR 80 | Wt 175.8 lb

## 2021-09-09 DIAGNOSIS — E782 Mixed hyperlipidemia: Secondary | ICD-10-CM

## 2021-09-09 DIAGNOSIS — Z9884 Bariatric surgery status: Secondary | ICD-10-CM

## 2021-09-09 DIAGNOSIS — R7303 Prediabetes: Secondary | ICD-10-CM

## 2021-09-09 DIAGNOSIS — N924 Excessive bleeding in the premenopausal period: Secondary | ICD-10-CM | POA: Insufficient documentation

## 2021-09-09 DIAGNOSIS — Z6834 Body mass index (BMI) 34.0-34.9, adult: Secondary | ICD-10-CM | POA: Diagnosis not present

## 2021-09-09 DIAGNOSIS — D649 Anemia, unspecified: Secondary | ICD-10-CM

## 2021-09-09 LAB — LIPID PANEL

## 2021-09-09 NOTE — Progress Notes (Signed)
Subjective:  Laura Cross is a 36 y.o. female who presents for Chief Complaint  Patient presents with   follow-up    Follow-up on iron and cholesterol. Fasitng today     Here for f/u on anemia and hyperlipidemia.  Using over the counter iron 65 mg.  She also notes there is some iron in her multivitamin she is taking.  She doesn't think she ever got a call from her pharmacy about the prescription iron we sent in October 2022.   Dyslipidemia - last we advised she start Vytorin.  She is taking this without side effects.     She has heavy periods, just recently saw gynecology.  She notes they discussed her heavy periods.  No other aggravating or relieving factors.    No other c/o.  Past Medical History:  Diagnosis Date   Anemia    Elevated liver enzymes    Family history of premature CAD    father   Fibroids    Impaired fasting blood sugar    Miscarriage 2014   Mixed dyslipidemia    Ovarian cyst    Pre-diabetes    Sexually transmitted disease (STD)    hx/o HPV, gonorrhea, chlamydia, genital herpes   Syphilis 2010   Current Outpatient Medications on File Prior to Visit  Medication Sig Dispense Refill   calcium gluconate 500 MG tablet Take 1 tablet by mouth 3 (three) times daily.     ezetimibe-simvastatin (VYTORIN) 10-20 MG tablet Take 1 tablet by mouth at bedtime. 90 tablet 3   ferrous fumarate-iron polysaccharide complex (TANDEM) 162-115.2 MG CAPS capsule Take 1 capsule by mouth daily with breakfast. 90 capsule 1   fluticasone (FLONASE) 50 MCG/ACT nasal spray Place 1-2 sprays into both nostrils daily. 16 g 0   Multiple Vitamins-Minerals (ADULT ONE DAILY GUMMIES PO) Take 2 tablets by mouth every evening.     pantoprazole (PROTONIX) 40 MG tablet Take 1 tablet (40 mg total) by mouth daily. 90 tablet 0   valACYclovir (VALTREX) 1000 MG tablet Take 2 tablets (2,000 mg total) by mouth 2 (two) times daily as needed (flare ups). 60 tablet 3   No current facility-administered  medications on file prior to visit.     The following portions of the patient's history were reviewed and updated as appropriate: allergies, current medications, past family history, past medical history, past social history, past surgical history and problem list.  ROS Otherwise as in subjective above  Objective: BP 120/80    Pulse 80    Wt 175 lb 12.8 oz (79.7 kg)    BMI 34.33 kg/m   General appearance: alert, no distress, well developed, well nourished Otherwise not examined   Assessment: Encounter Diagnoses  Name Primary?   Anemia, unspecified type Yes   Mixed dyslipidemia    Prediabetes    BMI 34.0-34.9,adult    S/P laparoscopic sleeve gastrectomy    Excessive bleeding in premenopausal period      Plan: Anemia-recheck labs today.  She is currently on oral over-the-counter iron and multivitamin with extra iron.  She has been on this about 3 months.  Will likely continue iron therapy assuming her periods remain heavy  Mixed dyslipidemia-updated labs today since she has been on Vytorin for 3 months.  Continue working on healthy low-cholesterol diet  Heavy periods-advise she follow-up with gynecology about a plan for dealing with the heavy periods  Obesity, prediabetes-continue efforts to eat healthy, exercise and work on continued weight loss   [name] was seen today  for follow-up.  Diagnoses and all orders for this visit:  Anemia, unspecified type -     Iron, TIBC and Ferritin Panel -     CBC  Mixed dyslipidemia -     Lipid panel  Prediabetes  BMI 34.0-34.9,adult  S/P laparoscopic sleeve gastrectomy  Excessive bleeding in premenopausal period   Follow up: pending labs

## 2021-09-10 ENCOUNTER — Other Ambulatory Visit: Payer: Self-pay | Admitting: Medical

## 2021-09-10 LAB — LIPID PANEL
Chol/HDL Ratio: 4.2 ratio (ref 0.0–4.4)
Cholesterol, Total: 197 mg/dL (ref 100–199)
HDL: 47 mg/dL (ref >39)
LDL Chol Calc (NIH): 121 mg/dL — ABNORMAL HIGH (ref 0–99)
Triglycerides: 163 mg/dL — ABNORMAL HIGH (ref 0–149)
VLDL Cholesterol Cal: 29 mg/dL (ref 5–40)

## 2021-09-10 LAB — CBC
Hematocrit: 37.6 % (ref 34.0–46.6)
Hemoglobin: 12.5 g/dL (ref 11.1–15.9)
MCH: 27.1 pg (ref 26.6–33.0)
MCHC: 33.2 g/dL (ref 31.5–35.7)
MCV: 81 fL (ref 79–97)
Platelets: 262 10*3/uL (ref 150–450)
RBC: 4.62 x10E6/uL (ref 3.77–5.28)
RDW: 14.7 % (ref 11.7–15.4)
WBC: 3.4 10*3/uL (ref 3.4–10.8)

## 2021-09-10 LAB — IRON,TIBC AND FERRITIN PANEL
Ferritin: 27 ng/mL (ref 15–150)
Iron Saturation: 14 % — ABNORMAL LOW (ref 15–55)
Iron: 52 ug/dL (ref 27–159)
Total Iron Binding Capacity: 371 ug/dL (ref 250–450)
UIBC: 319 ug/dL (ref 131–425)

## 2021-09-10 MED ORDER — PANTOPRAZOLE SODIUM 40 MG PO TBEC: 40.0000 mg | DELAYED_RELEASE_TABLET | Freq: Every day | ORAL | 1 refills | Status: DC

## 2021-09-10 MED ORDER — EZETIMIBE-SIMVASTATIN 10-40 MG PO TABS: 1.0000 | ORAL_TABLET | Freq: Every day | ORAL | 3 refills | Status: DC

## 2021-10-15 ENCOUNTER — Encounter: Payer: BC Managed Care – PPO | Attending: General Surgery | Admitting: Skilled Nursing Facility1

## 2021-10-15 ENCOUNTER — Other Ambulatory Visit: Payer: Self-pay

## 2021-10-15 DIAGNOSIS — E669 Obesity, unspecified: Secondary | ICD-10-CM | POA: Diagnosis not present

## 2021-10-15 NOTE — Progress Notes (Signed)
Bariatric Nutrition Follow-Up Visit ?Medical Nutrition Therapy  ? ?NUTRITION ASSESSMENT ?  ? ?Surgery date: 02/04/2021 ?Surgery type: sleeve ?Start weight at [facility]: 204 ?Weight today: pt declined ?  ?Body Composition Scale 02/18/2021 04/03/2021 05/13/2021 07/23/2021  ?Current Body Weight 190.6 182.6 175.6 174.6  ?Total Body Fat % 39.2 37.9 36.7 37.3  ?Visceral Fat 10 10 9 9   ?Fat-Free Mass % 60.7 62 63.2 62.6  ? Total Body Water % 44.8 45.5 46.1 45.8  ?Muscle-Mass lbs 29.8 29.7 29.7 29  ?BMI 29.8 34.5 33.2 33.8  ?Body Fat Displacement      ?       Torso  lbs 46.2 42.8 39.9 40.2  ?       Left Leg  lbs 9.2 8.5 7.9 8  ?       Right Leg  lbs 9.2 8.5 7.9 8  ?       Left Arm  lbs 4.6 4.2 3.9 4  ?       Right Arm   lbs 4.6 4.2 3.9 4  ? ?  ?Lifestyle & Dietary Hx ? ? ?Labs: iron saturation 14, triglycerides 163, LDL 121 ? ? ?Pt state she did not want to get weighed because she has not been doing what she needs to. Pt states she went on vacation 3 weeks and may have taken her habits home with her.  ?Pt states she weighs 2+ times a week and if she does not weigh she will have too much anxiety stating her boyfriend hid it the first time but she bought a new one.  ?Pt states she is still working on trying to get enough fluid in the day.  ? ?Goals met: ?More energy ?More active ?Having bowel movements daily ?Better lab values  ? ?Estimated daily fluid intake: 45 oz ?Estimated daily protein intake: 60+ g ?Supplements: multi and calcium and 65 mg iron ?Current average weekly physical activity: dance class 1-2 times a week ? ?24-Hr Dietary Recall ?First Meal: eggs or sausage or protein shake ?Snack: p3 or bacon and eggs + multi grain thin bread + jelly ?Second Meal: flat bread sandwich  ?Snack:  oreos or fruit or granola bar ?Third Meal: salmon + zucchini or salad: tomatoes, onions, cucmber + wings ?Snack:  ?Beverages: alkaline water + flavorings, Gatorade zero, coffee ? ?Post-Op Goals/ Signs/ Symptoms ?Using straws: no ?Drinking  while eating: no ?Chewing/swallowing difficulties: no ?Changes in vision: no ?Changes to mood/headaches: no ?Hair loss/changes to skin/nails: no ?Difficulty focusing/concentrating: no ?Sweating: no ?Dizziness/lightheadedness: no ?Palpitations: no  ?Carbonated/caffeinated beverages: no ?N/V/D/C/Gas: nono ?Abdominal pain: no ?Dumping syndrome: no ? ?  ?NUTRITION DIAGNOSIS  ?Overweight/obesity ([place]-3.3) related to past poor dietary habits and physical inactivity as evidenced by completed bariatric surgery and following dietary guidelines for continued weight loss and healthy nutrition status. ?  ?  ?NUTRITION INTERVENTION continued ?Nutrition counseling (C-1) and education (E-2) to facilitate bariatric surgery goals, including: ?The importance of consuming adequate calories as well as certain nutrients daily due to the body's need for essential vitamins, minerals, and fats ?The importance of daily physical activity and to reach a goal of at least 150 minutes of moderate to vigorous physical activity weekly (or as directed by their physician) due to benefits such as increased musculature and improved lab values ?The importance of intuitive eating specifically learning hunger-satiety cues and understanding the importance of learning a new body: The importance of mindful eating to avoid grazing behaviors  ?Importance of vegetables ?To have an overall healthy  diet, adult men and women are recommended to consume anywhere from 2-3 cups of vegetables daily. Vegetables provide a wide range of vitamins and minerals such as vitamin A, vitamin C, potassium, and folic acid. According to the [facility], including fruit and vegetables daily may reduce the risk of cardiovascular disease, certain cancers, and other non-communicable diseases. ?A low fat, low cholesterol is discussed with the patient ? ?Goals: ?-start working with a therapist ?-let food worries/anxieties go; following a restrictive diet will only create  more anxiety ? ?Handouts Previously Provided Include  ?Phase 7 ? ?Learning Style & Readiness for Change ?Teaching method utilized: Visual & Auditory  ?Demonstrated degree of understanding via: Teach Back  ?Readiness Level: action ?Barriers to learning/adherence to lifestyle change: none identified  ? ?RD's Notes for Next Visit ?Assess adherence to pt chosen goals  ? ? ?MONITORING & EVALUATION ?Dietary intake, weekly physical activity, body weight ?Next Steps ?Patient is to follow-up in march ? ?

## 2021-11-05 ENCOUNTER — Ambulatory Visit: Payer: BC Managed Care – PPO | Admitting: Medical

## 2021-11-05 VITALS — BP 120/70 | HR 73 | Temp 98.2°F | Wt 173.2 lb

## 2021-11-05 DIAGNOSIS — R21 Rash and other nonspecific skin eruption: Secondary | ICD-10-CM

## 2021-11-05 DIAGNOSIS — E611 Iron deficiency: Secondary | ICD-10-CM | POA: Diagnosis not present

## 2021-11-05 DIAGNOSIS — R7303 Prediabetes: Secondary | ICD-10-CM | POA: Diagnosis not present

## 2021-11-05 DIAGNOSIS — Z9884 Bariatric surgery status: Secondary | ICD-10-CM | POA: Diagnosis not present

## 2021-11-05 DIAGNOSIS — E669 Obesity, unspecified: Secondary | ICD-10-CM | POA: Diagnosis not present

## 2021-11-05 MED ORDER — CLOTRIMAZOLE-BETAMETHASONE 1-0.05 % EX CREA: 1.0000 "application " | TOPICAL_CREAM | Freq: Every day | CUTANEOUS | 0 refills | Status: DC

## 2021-11-05 NOTE — Progress Notes (Signed)
Subjective: ? Laura Cross is a 36 y.o. female who presents for ?Chief Complaint  ?Patient presents with  ? rash on neck  ?  Rash on neck since November. Itches and burning  ?   ?Here for rash on left and right neck.  Been there for a few months.  Sometimes itchy sometimes burning.  No other household contacts with similar rash.  She wears large earrings.  Use some Neosporin but nothing else topically. ? ?No other aggravating or relieving factors.   ? ?No other c/o. ? ?The following portions of the patient's history were reviewed and updated as appropriate: allergies, current medications, past family history, past medical history, past social history, past surgical history and problem list. ? ?ROS ?Otherwise as in subjective above ? ?Objective: ?BP 120/70   Pulse 73   Temp 98.2 ?F (36.8 ?C)   Wt 173 lb 3.2 oz (78.6 kg)   BMI 33.83 kg/m?  ? ?General appearance: alert, no distress, well developed, well nourished ?Skin: There is approximately 6 cm x 3 cm somewhat oval rash of the left and right lateral neck.  The rash happens to be where her large earrings rub and touch bilaterally ? ? ?Assessment: ?Encounter Diagnosis  ?Name Primary?  ? Rash Yes  ? ? ? ?Plan: ?Tinea versus metal allergy such as nickel allergy.  KOH prep does not show any fungus but her hair is long and there is the potential for trapping moisture. ? ?Begin Lotrisone cream.  If not completely resolved within the next 7 to 10 days call back.  Stop using the current earrings as she may have a metal allergy ? ?She does not recall having a rash with prior other earrings that were more pure gold ? ?Laura Cross was seen today for rash on neck. ? ?Diagnoses and all orders for this visit: ? ?Rash ? ?Other orders ?-     clotrimazole-betamethasone (LOTRISONE) cream; Apply 1 application. topically daily. ? ? ? ?Follow up: call back within 2 weeks ?

## 2021-12-05 DIAGNOSIS — F4320 Adjustment disorder, unspecified: Secondary | ICD-10-CM | POA: Diagnosis not present

## 2022-01-15 ENCOUNTER — Encounter: Payer: BC Managed Care – PPO | Attending: General Surgery | Admitting: Skilled Nursing Facility1

## 2022-01-15 ENCOUNTER — Encounter: Payer: Self-pay | Admitting: Skilled Nursing Facility1

## 2022-01-15 DIAGNOSIS — E669 Obesity, unspecified: Secondary | ICD-10-CM | POA: Insufficient documentation

## 2022-01-15 NOTE — Progress Notes (Signed)
Bariatric Nutrition Follow-Up Visit Medical Nutrition Therapy   NUTRITION ASSESSMENT    Surgery date: 02/04/2021 Surgery type: sleeve Start weight at [facility]: 204 Weight today: 173 pounds   Body Composition Scale 04/03/2021 05/13/2021 07/23/2021 01/15/2022  Current Body Weight 182.6 175.6 174.6 173  Total Body Fat % 37.9 36.7 37.3 36.3  Visceral Fat _0 Fat-Free Mass % 62 63.2 62.6 63.6   Total Body Water % 45.5 46.1 45.8 46.3  Muscle-Mass lbs 29.7 29.7 29 29.5  BMI 34.5 33.2 33.8 32.6  Body Fat Displacement             Torso  lbs 42.8 39.9 40.2 38.8         Left Leg  lbs 8.5 7.9 8 7.7         Right Leg  lbs 8.5 7.9 8 7.7         Left Arm  lbs 4.2 3.9 4 3.8         Right Arm   lbs 4.2 3.9 4 3.8     Lifestyle & Dietary Hx   Labs: iron saturation 14, triglycerides 163, LDL 121   Pt states she is trying to decide where to move and possibly move back in with her mom. Pt states the area isn't the safest so she has deal with that.  Pt states she is about to be in her friends wedding which has been very expensive.  Pt states she has been able to let weigh and the scale go.  Pt states with her workouts she has too much energy.   Pt states her surgeon put her on a weight loss medicine but did not fill it.   Pt states she is worried about moving in with her mom because she eats a lot of fried food.    Estimated daily fluid intake: 45 oz Estimated daily protein intake: 60+ g Supplements: multi and calcium and 65 mg iron Current average weekly physical activity: dance class 1-2 times a week, zumba 1 time a week, 1 time a week working out on her own  24-Hr Dietary Recall First Meal: sausage and yogurt and mini waffles  Snack: toasted almonds or sugar free jello or tuna and crackers  Second Meal: salad: cesar  Snack:  oreos or fruit or granola bar Third Meal: salmon + zucchini or salad: tomatoes, onions, cucmber + wings or jerked chicken + cabbage + mac n cheese + rice   Snack:  Beverages: alkaline water + flavorings, Gatorade zero, coffee, diet green tea  Post-Op Goals/ Signs/ Symptoms Using straws: no Drinking while eating: no Chewing/swallowing difficulties: no Changes in vision: no Changes to mood/headaches: no Hair loss/changes to skin/nails: no Difficulty focusing/concentrating: no Sweating: no Dizziness/lightheadedness: no Palpitations: no  Carbonated/caffeinated beverages: no N/V/D/C/Gas: nono Abdominal pain: no Dumping syndrome: no    NUTRITION DIAGNOSIS  Overweight/obesity ([place]-3.3) related to past poor dietary habits and physical inactivity as evidenced by completed bariatric surgery and following dietary guidelines for continued weight loss and healthy nutrition status.     NUTRITION INTERVENTION continued Nutrition counseling (C-1) and education (E-2) to facilitate bariatric surgery goals, including: The importance of consuming adequate calories as well as certain nutrients daily due to the body's need for essential vitamins, minerals, and fats The importance of daily physical activity and to reach a goal of at least 150 minutes of moderate to vigorous physical activity weekly (or as directed by their physician) due to benefits such as increased musculature and  improved lab values The importance of intuitive eating specifically learning hunger-satiety cues and understanding the importance of learning a new body: The importance of mindful eating to avoid grazing behaviors  Importance of vegetables To have an overall healthy diet, adult men and women are recommended to consume anywhere from 2-3 cups of vegetables daily. Vegetables provide a wide range of vitamins and minerals such as vitamin A, vitamin C, potassium, and folic acid. According to the [facility], including fruit and vegetables daily may reduce the risk of cardiovascular disease, certain cancers, and other non-communicable diseases. A low fat, low cholesterol is  discussed with the patient Encouraged patient to honor their body's internal hunger and fullness cues.  Throughout the day, check in mentally and rate hunger. Stop eating when satisfied not full regardless of how much food is left on the plate.  Get more if still hungry 20-30 minutes later.  The key is to honor satisfaction so throughout the meal, rate fullness factor and stop when comfortably satisfied not physically full. The key is to honor hunger and fullness without any feelings of guilt or shame.  Pay attention to what the internal cues are, rather than any external factors. This will enhance the confidence you have in listening to your own body and following those internal cues enabling you to increase how often you eat when you are hungry not out of appetite and stop when you are satisfied not full.  Encouraged pt to continue to eat balanced meals inclusive of non starchy vegetables 2 times a day 7 days a week Encouraged pt to choose lean protein sources: limiting beef, pork, sausage, hotdogs, and lunch meat Encourage pt to choose healthy fats such as plant based limiting animal fats Encouraged pt to continue to drink a minium 64 fluid ounces with half being plain water to satisfy proper hydration   Goals: -do not lose your activity when you move  Handouts Previously Provided Include  Phase 7  Learning Style & Readiness for Change Teaching method utilized: Visual & Auditory  Demonstrated degree of understanding via: Teach Back  Readiness Level: action Barriers to learning/adherence to lifestyle change: none identified   RD's Notes for Next Visit Assess adherence to pt chosen goals    MONITORING & EVALUATION Dietary intake, weekly physical activity, body weight Next Steps Patient is to follow-up as needed: encouraged pt to call or e-mail if any future assistance needed

## 2022-02-06 DIAGNOSIS — Z9884 Bariatric surgery status: Secondary | ICD-10-CM | POA: Diagnosis not present

## 2022-02-06 DIAGNOSIS — R790 Abnormal level of blood mineral: Secondary | ICD-10-CM | POA: Diagnosis not present

## 2022-02-06 DIAGNOSIS — E7800 Pure hypercholesterolemia, unspecified: Secondary | ICD-10-CM | POA: Diagnosis not present

## 2022-02-06 DIAGNOSIS — R7303 Prediabetes: Secondary | ICD-10-CM | POA: Diagnosis not present

## 2022-02-06 DIAGNOSIS — E669 Obesity, unspecified: Secondary | ICD-10-CM | POA: Diagnosis not present

## 2022-02-16 ENCOUNTER — Other Ambulatory Visit (HOSPITAL_COMMUNITY): Payer: Self-pay

## 2022-02-16 MED ORDER — WEGOVY 1.7 MG/0.75ML ~~LOC~~ SOAJ
1.7000 mg | SUBCUTANEOUS | 0 refills | Status: DC
  Filled 2022-02-16: qty 3, 28d supply, fill #0

## 2022-03-20 ENCOUNTER — Other Ambulatory Visit (HOSPITAL_COMMUNITY): Payer: Self-pay

## 2022-03-24 ENCOUNTER — Other Ambulatory Visit (HOSPITAL_COMMUNITY): Payer: Self-pay

## 2022-03-25 ENCOUNTER — Other Ambulatory Visit (HOSPITAL_COMMUNITY): Payer: Self-pay

## 2022-03-25 MED ORDER — WEGOVY 2.4 MG/0.75ML ~~LOC~~ SOAJ
2.4000 mg | SUBCUTANEOUS | 2 refills | Status: DC
  Filled 2022-03-25: qty 3, 28d supply, fill #0
  Filled 2022-05-08: qty 3, 28d supply, fill #1
  Filled 2022-05-29 – 2022-06-01 (×2): qty 3, 28d supply, fill #2

## 2022-03-31 ENCOUNTER — Other Ambulatory Visit (HOSPITAL_COMMUNITY): Payer: Self-pay

## 2022-04-15 ENCOUNTER — Encounter: Payer: Self-pay | Admitting: Internal Medicine

## 2022-05-08 ENCOUNTER — Other Ambulatory Visit (HOSPITAL_COMMUNITY): Payer: Self-pay

## 2022-05-08 DIAGNOSIS — E669 Obesity, unspecified: Secondary | ICD-10-CM | POA: Diagnosis not present

## 2022-05-08 DIAGNOSIS — Z9884 Bariatric surgery status: Secondary | ICD-10-CM | POA: Diagnosis not present

## 2022-05-08 DIAGNOSIS — R7303 Prediabetes: Secondary | ICD-10-CM | POA: Diagnosis not present

## 2022-05-19 ENCOUNTER — Encounter: Payer: Self-pay | Admitting: Internal Medicine

## 2022-05-29 ENCOUNTER — Other Ambulatory Visit (HOSPITAL_COMMUNITY): Payer: Self-pay

## 2022-06-01 ENCOUNTER — Other Ambulatory Visit (HOSPITAL_COMMUNITY): Payer: Self-pay

## 2022-06-29 ENCOUNTER — Other Ambulatory Visit (HOSPITAL_COMMUNITY): Payer: Self-pay

## 2022-07-01 ENCOUNTER — Other Ambulatory Visit (HOSPITAL_COMMUNITY): Payer: Self-pay

## 2022-07-03 ENCOUNTER — Other Ambulatory Visit (HOSPITAL_COMMUNITY): Payer: Self-pay

## 2022-07-06 ENCOUNTER — Other Ambulatory Visit (HOSPITAL_COMMUNITY): Payer: Self-pay

## 2022-07-06 MED ORDER — WEGOVY 2.4 MG/0.75ML ~~LOC~~ SOAJ
2.4000 mg | SUBCUTANEOUS | 2 refills | Status: DC
Start: 2022-07-06 — End: 2022-09-15
  Filled 2022-07-06: qty 3, 28d supply, fill #0
  Filled 2022-07-29: qty 3, 28d supply, fill #1
  Filled 2022-08-28: qty 3, 28d supply, fill #2

## 2022-07-30 ENCOUNTER — Other Ambulatory Visit (HOSPITAL_COMMUNITY): Payer: Self-pay

## 2022-07-31 ENCOUNTER — Other Ambulatory Visit (HOSPITAL_COMMUNITY): Payer: Self-pay

## 2022-08-18 ENCOUNTER — Encounter: Payer: Self-pay | Admitting: Internal Medicine

## 2022-08-28 ENCOUNTER — Other Ambulatory Visit: Payer: Self-pay

## 2022-08-28 ENCOUNTER — Other Ambulatory Visit (HOSPITAL_COMMUNITY): Payer: Self-pay

## 2022-08-29 ENCOUNTER — Other Ambulatory Visit (HOSPITAL_COMMUNITY): Payer: Self-pay

## 2022-08-31 ENCOUNTER — Other Ambulatory Visit (HOSPITAL_COMMUNITY): Payer: Self-pay

## 2022-09-14 DIAGNOSIS — Z8639 Personal history of other endocrine, nutritional and metabolic disease: Secondary | ICD-10-CM | POA: Diagnosis not present

## 2022-09-15 ENCOUNTER — Other Ambulatory Visit (HOSPITAL_COMMUNITY): Payer: Self-pay

## 2022-09-15 MED ORDER — WEGOVY 2.4 MG/0.75ML ~~LOC~~ SOAJ
2.4000 mg | SUBCUTANEOUS | 2 refills | Status: DC
  Filled 2022-09-15 – 2022-09-25 (×2): qty 3, 28d supply, fill #0
  Filled 2022-10-23: qty 3, 28d supply, fill #1
  Filled 2022-11-20: qty 3, 28d supply, fill #2

## 2022-09-25 ENCOUNTER — Other Ambulatory Visit: Payer: Self-pay

## 2022-10-09 ENCOUNTER — Encounter: Payer: Self-pay | Admitting: Medical

## 2022-10-09 ENCOUNTER — Ambulatory Visit (INDEPENDENT_AMBULATORY_CARE_PROVIDER_SITE_OTHER): Payer: BC Managed Care – PPO | Admitting: Medical

## 2022-10-09 VITALS — BP 110/70 | HR 87 | Ht 60.5 in | Wt 137.4 lb

## 2022-10-09 DIAGNOSIS — Z1329 Encounter for screening for other suspected endocrine disorder: Secondary | ICD-10-CM | POA: Diagnosis not present

## 2022-10-09 DIAGNOSIS — E782 Mixed hyperlipidemia: Secondary | ICD-10-CM

## 2022-10-09 DIAGNOSIS — R7301 Impaired fasting glucose: Secondary | ICD-10-CM | POA: Diagnosis not present

## 2022-10-09 DIAGNOSIS — Z23 Encounter for immunization: Secondary | ICD-10-CM | POA: Diagnosis not present

## 2022-10-09 DIAGNOSIS — Z113 Encounter for screening for infections with a predominantly sexual mode of transmission: Secondary | ICD-10-CM | POA: Diagnosis not present

## 2022-10-09 DIAGNOSIS — Z7185 Encounter for immunization safety counseling: Secondary | ICD-10-CM | POA: Diagnosis not present

## 2022-10-09 DIAGNOSIS — Z9884 Bariatric surgery status: Secondary | ICD-10-CM

## 2022-10-09 DIAGNOSIS — Z0000 Encounter for general adult medical examination without abnormal findings: Secondary | ICD-10-CM | POA: Diagnosis not present

## 2022-10-09 DIAGNOSIS — Z8639 Personal history of other endocrine, nutritional and metabolic disease: Secondary | ICD-10-CM | POA: Diagnosis not present

## 2022-10-09 LAB — POCT URINALYSIS DIP (PROADVANTAGE DEVICE)
Bilirubin, UA: NEGATIVE
Blood, UA: NEGATIVE
Glucose, UA: NEGATIVE mg/dL
Leukocytes, UA: NEGATIVE
Nitrite, UA: NEGATIVE
Specific Gravity, Urine: 1.02
Urobilinogen, Ur: NEGATIVE
pH, UA: 6 (ref 5.0–8.0)

## 2022-10-09 NOTE — Addendum Note (Signed)
Addended by: Minette Headland A on: 10/09/2022 10:43 AM   Modules accepted: Orders

## 2022-10-09 NOTE — Progress Notes (Addendum)
Subjective:   HPI  Laura Cross is a 37 y.o. female who presents for Chief Complaint  Patient presents with   fasting cpe    Fasting cpe, has obgyn, no other concerns, needs HPV    Patient Care Team: Daizha Anand, Leward Quan as PCP - General (Family Medicine) Ob/Gyn, [facility] (Obstetrics and Gynecology) Sees dentist Sees eye doctor Dr. Greer Pickerel,  Carlena Hurl, PA, [facility], [place], [place]  Concerns: Just moved 2 hours away, [facility].  Just established with dentist there.    Exercising some, can do more.    Working from home [place] [facility], hx/o sleeve bypass and using wegovy to maintain weight stable  Not currently taking vytorin.    Not currently on lipid medication since her surgery for gastric bypass  No other c/o   Reviewed their medical, surgical, family, social, medication, and allergy history and updated chart as appropriate.  Past Medical History:  Diagnosis Date   Anemia    Elevated liver enzymes    Family history of premature CAD    father   Fibroids    Impaired fasting blood sugar    Miscarriage 2014   Mixed dyslipidemia    Ovarian cyst    Pre-diabetes    Sexually transmitted disease (STD)    hx/o HPV, gonorrhea, chlamydia, genital herpes   Syphilis 2010    Family History  Problem Relation Age of Onset   Heart disease Father 54       died MI   Hypertension Maternal Grandmother    Diabetes Maternal Grandmother    Stroke Maternal Grandmother    Heart disease Maternal Grandmother    Hypertension Maternal Grandfather    COPD Maternal Grandfather    Cancer Maternal Grandfather 5       colon   GER disease Mother    Deep vein thrombosis Mother        with MVA   Heart disease Mother 30       LVH   COPD Paternal Grandfather    Pneumonia Brother    Cancer Maternal Aunt        breast   Cancer Cousin        breast     Current Outpatient Medications:    calcium  gluconate 500 MG tablet, Take 1 tablet by mouth 3 (three) times daily., Disp: , Rfl:    fluticasone (FLONASE) 50 MCG/ACT nasal spray, Place 1-2 sprays into both nostrils daily., Disp: 16 g, Rfl: 0   Multiple Vitamins-Minerals (ADULT ONE DAILY GUMMIES PO), Take 2 tablets by mouth every evening., Disp: , Rfl:    Semaglutide-Weight Management (WEGOVY) 2.4 MG/0.75ML SOAJ, Inject 2.4 mg into the skin once a week., Disp: 3 mL, Rfl: 2   valACYclovir (VALTREX) 1000 MG tablet, Take 2 tablets (2,000 mg total) by mouth 2 (two) times daily as needed (flare ups)., Disp: 60 tablet, Rfl: 3   ezetimibe-simvastatin (VYTORIN) 10-40 MG tablet, Take 1 tablet by mouth at bedtime. (Patient not taking: Reported on 10/09/2022), Disp: 90 tablet, Rfl: 3  Allergies  Allergen Reactions   Meloxicam Other (See Comments)    Bleeding?  Can tolerate other NSAIDs   Metformin And Related Diarrhea and Other (See Comments)    hair falling out   Nuvaring [Etonogestrel-Ethinyl Estradiol] Other (See Comments)    Flu-like symptoms    Review of Systems  Constitutional:  Negative for chills, fever, malaise/fatigue and weight loss.  HENT:  Negative  for congestion, ear pain, hearing loss, sore throat and tinnitus.   Eyes:  Negative for blurred vision, pain and redness.  Respiratory:  Negative for cough, hemoptysis and shortness of breath.   Cardiovascular:  Negative for chest pain, palpitations, orthopnea, claudication and leg swelling.  Gastrointestinal:  Negative for abdominal pain, blood in stool, constipation, diarrhea, nausea and vomiting.  Genitourinary:  Negative for dysuria, flank pain, frequency, hematuria and urgency.  Musculoskeletal:  Negative for falls, joint pain and myalgias.  Skin:  Negative for itching and rash.  Neurological:  Negative for dizziness, tingling, speech change, weakness and headaches.  Endo/Heme/Allergies:  Negative for polydipsia. Does not bruise/bleed easily.  Psychiatric/Behavioral:  Negative for  depression and memory loss. The patient is not nervous/anxious and does not have insomnia.         10/09/2022    9:47 AM 09/09/2021    8:32 AM 06/04/2021   11:20 AM 05/06/2021   11:35 AM 05/30/2020    8:36 AM  Depression screen PHQ 2/9  Decreased Interest 0 0 0 0 0  Down, Depressed, Hopeless 0 0 0 0 0  PHQ - 2 Score 0 0 0 0 0  Altered sleeping     0  Tired, decreased energy     0  Change in appetite     0  Feeling bad or failure about yourself      0  Trouble concentrating     0  Moving slowly or fidgety/restless     0  Suicidal thoughts     0  PHQ-9 Score     0       Objective:  BP 110/70   Pulse 87   Ht 5' 0.5" (1.537 m)   Wt 137 lb 6.4 oz (62.3 kg)   BMI 26.39 kg/m   Wt Readings from Last 3 Encounters:  10/09/22 137 lb 6.4 oz (62.3 kg)  01/15/22 173 lb (78.5 kg)  11/05/21 173 lb 3.2 oz (78.6 kg)   BP Readings from Last 3 Encounters:  10/09/22 110/70  11/05/21 120/70  09/09/21 120/80   General appearance: alert, no distress, WD/WN, African American female Skin: unremarkable, tattoos right upper chest, right upper arm, tattoo of crown right forearm HEENT: normocephalic, conjunctiva/corneas normal, sclerae anicteric, PERRLA, EOMi Neck: supple, no lymphadenopathy, no thyromegaly, no masses, normal ROM, no bruits Chest: non tender, normal shape and expansion Heart: RRR, normal S1, S2, no murmurs Lungs: CTA bilaterally, no wheezes, rhonchi, or rales Abdomen: +bs, soft, non tender, non distended, no masses, no hepatomegaly, no splenomegaly, no bruits Back: non tender, normal ROM, no scoliosis Musculoskeletal: upper extremities non tender, no obvious deformity, normal ROM throughout, lower extremities non tender, no obvious deformity, normal ROM throughout Extremities: no edema, no cyanosis, no clubbing Pulses: 2+ symmetric, upper and lower extremities, normal cap refill Neurological: alert, oriented x 3, CN2-12 intact, strength normal upper extremities and lower  extremities, sensation normal throughout, DTRs 2+ throughout, no cerebellar signs, gait normal Psychiatric: normal affect, behavior normal, pleasant  Breast/gyn/rectal - deferred to gynecology    Assessment and Plan :   Encounter Diagnoses  Name Primary?   Encounter for health maintenance examination in adult Yes   Impaired fasting blood sugar    Vaccine counseling    Screen for STD (sexually transmitted disease)    S/P laparoscopic sleeve gastrectomy    Mixed dyslipidemia    History of iron deficiency    Screening for thyroid disorder    Need for HPV vaccination  This visit was a preventative care visit, also known as wellness visit or routine physical.   Topics typically include healthy lifestyle, diet, exercise, preventative care, vaccinations, sick and well care, proper use of emergency dept and after hours care, as well as other concerns.     Recommendations: Continue to return yearly for your annual wellness and preventative care visits.  This gives [name] a chance to discuss healthy lifestyle, exercise, vaccinations, review your chart record, and perform screenings where appropriate.  I recommend you see your eye doctor yearly for routine vision care.  I recommend you see your dentist yearly for routine dental care including hygiene visits twice yearly.   Vaccination recommendations were reviewed Immunization History  Administered Date(s) Administered   HPV 9-valent 10/09/2022   HPV Quadrivalent 04/27/2012, 06/27/2012   PFIZER Comirnaty(Gray Top)Covid-19 Tri-Sucrose Vaccine 11/12/2020, 12/10/2020   Td 06/05/2015   Tdap 01/21/2018   Counseled on the Human Papilloma virus vaccine.  Vaccine information sheet given.  HPV vaccine given after consent obtained.       Screening for cancer: Colon cancer screening: Age 64  Breast cancer screening: You should perform a self breast exam monthly.   We reviewed recommendations for regular mammograms and breast cancer  screening.  Cervical cancer screening: We reviewed recommendations for pap smear screening.   Skin cancer screening: Check your skin regularly for new changes, growing lesions, or other lesions of concern Come in for evaluation if you have skin lesions of concern.  Lung cancer screening: If you have a greater than 20 pack year history of tobacco use, then you may qualify for lung cancer screening with a chest CT scan.   Please call your insurance company to inquire about coverage for this test.  We currently don't have screenings for other cancers besides breast, cervical, colon, and lung cancers.  If you have a strong family history of cancer or have other cancer screening concerns, please let me know.    Bone health: Get at least 150 minutes of aerobic exercise weekly Get weight bearing exercise at least once weekly Bone density test:  A bone density test is an imaging test that uses a type of X-ray to measure the amount of calcium and other minerals in your bones. The test may be used to diagnose or screen you for a condition that causes weak or thin bones (osteoporosis), predict your risk for a broken bone (fracture), or determine how well your osteoporosis treatment is working. The bone density test is recommended for females 45 and older, or females or males [id number] if certain risk factors such as thyroid disease, long term use of steroids such as for asthma or rheumatological issues, vitamin D deficiency, estrogen deficiency, family history of osteoporosis, self or family history of fragility fracture in first degree relative.    Heart health: Get at least 150 minutes of aerobic exercise weekly Limit alcohol It is important to maintain a healthy blood pressure and healthy cholesterol numbers  Heart disease screening: Screening for heart disease includes screening for blood pressure, fasting lipids, glucose/diabetes screening, BMI height to weight ratio, reviewed of smoking status,  physical activity, and diet.    Goals include blood pressure 120/80 or less, maintaining a healthy lipid/cholesterol profile, preventing diabetes or keeping diabetes numbers under good control, not smoking or using tobacco products, exercising most days per week or at least 150 minutes per week of exercise, and eating healthy variety of fruits and vegetables, healthy oils, and avoiding unhealthy food choices like fried  food, fast food, high sugar and high cholesterol foods.    Other tests may possibly include EKG test, CT coronary calcium score, echocardiogram, exercise treadmill stress test.    Medical care options: I recommend you continue to seek care here first for routine care.  We try really hard to have available appointments Monday through Friday daytime hours for sick visits, acute visits, and physicals.  Urgent care should be used for after hours and weekends for significant issues that cannot wait till the next day.  The emergency department should be used for significant potentially life-threatening emergencies.  The emergency department is expensive, can often have long wait times for less significant concerns, so try to utilize primary care, urgent care, or telemedicine when possible to avoid unnecessary trips to the emergency department.  Virtual visits and telemedicine have been introduced since the pandemic started in 2020, and can be convenient ways to receive medical care.  We offer virtual appointments as well to assist you in a variety of options to seek medical care.    Separate significant issues discussed: Doing well with wegovy, s/p sleeve bypass and healthy eating habits  Prior dyslipidemia-updated labs today fasting, discussed diet, prevention.  She hasn't been eager to use medication prior for this  Genital herpes-uses Valtrex prn   Sabriyah was seen today for fasting cpe.  Diagnoses and all orders for this visit:  Encounter for health maintenance examination in  adult -     Comprehensive metabolic panel -     CBC -     Lipid panel -     Hemoglobin A1c -     TSH -     VITAMIN D 25 Hydroxy (Vit-D Deficiency, Fractures) -     RPR+HIV+GC+CT Panel -     Hepatitis B surface antigen -     Hepatitis C antibody -     Iron -     POCT Urinalysis DIP (Proadvantage Device)  Impaired fasting blood sugar -     Hemoglobin A1c  Vaccine counseling  Screen for STD (sexually transmitted disease) -     RPR+HIV+GC+CT Panel -     Hepatitis B surface antigen -     Hepatitis C antibody  S/P laparoscopic sleeve gastrectomy  Mixed dyslipidemia -     Lipid panel  History of iron deficiency -     Iron  Screening for thyroid disorder -     TSH  Need for HPV vaccination -     HPV 9-valent vaccine,Recombinat    Follow-up pending labs, yearly for physical

## 2022-10-10 NOTE — Progress Notes (Signed)
Results sent through MyChart

## 2022-10-13 LAB — LIPID PANEL
Chol/HDL Ratio: 4.2 ratio (ref 0.0–4.4)
Cholesterol, Total: 217 mg/dL — ABNORMAL HIGH (ref 100–199)
HDL: 52 mg/dL (ref >39)
LDL Chol Calc (NIH): 149 mg/dL — ABNORMAL HIGH (ref 0–99)
Triglycerides: 90 mg/dL (ref 0–149)
VLDL Cholesterol Cal: 16 mg/dL (ref 5–40)

## 2022-10-13 LAB — CBC
Hematocrit: 35.9 % (ref 34.0–46.6)
Hemoglobin: 11.5 g/dL (ref 11.1–15.9)
MCH: 27.4 pg (ref 26.6–33.0)
MCHC: 32 g/dL (ref 31.5–35.7)
MCV: 86 fL (ref 79–97)
Platelets: 357 10*3/uL (ref 150–450)
RBC: 4.19 x10E6/uL (ref 3.77–5.28)
RDW: 12.8 % (ref 11.7–15.4)
WBC: 3.2 10*3/uL — ABNORMAL LOW (ref 3.4–10.8)

## 2022-10-13 LAB — COMPREHENSIVE METABOLIC PANEL
ALT: 17 IU/L (ref 0–32)
AST: 16 IU/L (ref 0–40)
Albumin/Globulin Ratio: 1.6 (ref 1.2–2.2)
Albumin: 4.4 g/dL (ref 3.9–4.9)
Alkaline Phosphatase: 73 IU/L (ref 44–121)
BUN/Creatinine Ratio: 16 (ref 9–23)
BUN: 14 mg/dL (ref 6–20)
Bilirubin Total: 0.4 mg/dL (ref 0.0–1.2)
CO2: 21 mmol/L (ref 20–29)
Calcium: 9.4 mg/dL (ref 8.7–10.2)
Chloride: 101 mmol/L (ref 96–106)
Creatinine, Ser: 0.85 mg/dL (ref 0.57–1.00)
Globulin, Total: 2.7 g/dL (ref 1.5–4.5)
Glucose: 71 mg/dL (ref 70–99)
Potassium: 4.1 mmol/L (ref 3.5–5.2)
Sodium: 137 mmol/L (ref 134–144)
Total Protein: 7.1 g/dL (ref 6.0–8.5)
eGFR: 91 mL/min/{1.73_m2} (ref >59)

## 2022-10-13 LAB — RPR+HIV+GC+CT PANEL
Chlamydia trachomatis, NAA: NEGATIVE
HIV Screen 4th Generation wRfx: NONREACTIVE
Neisseria Gonorrhoeae by PCR: NEGATIVE
RPR Ser Ql: NONREACTIVE

## 2022-10-13 LAB — HEMOGLOBIN A1C
Est. average glucose Bld gHb Est-mCnc: 108 mg/dL
Hgb A1c MFr Bld: 5.4 % (ref 4.8–5.6)

## 2022-10-13 LAB — HEPATITIS B SURFACE ANTIGEN: Hepatitis B Surface Ag: NEGATIVE

## 2022-10-13 LAB — HEPATITIS C ANTIBODY: Hep C Virus Ab: NONREACTIVE

## 2022-10-13 LAB — VITAMIN D 25 HYDROXY (VIT D DEFICIENCY, FRACTURES): Vit D, 25-Hydroxy: 33.7 ng/mL (ref 30.0–100.0)

## 2022-10-13 LAB — TSH: TSH: 1.34 u[IU]/mL (ref 0.450–4.500)

## 2022-10-13 LAB — IRON: Iron: 63 ug/dL (ref 27–159)

## 2022-10-13 NOTE — Progress Notes (Signed)
Results sent through MyChart

## 2022-10-23 ENCOUNTER — Other Ambulatory Visit: Payer: Self-pay

## 2022-11-20 ENCOUNTER — Other Ambulatory Visit: Payer: Self-pay

## 2023-01-29 DIAGNOSIS — Z01419 Encounter for gynecological examination (general) (routine) without abnormal findings: Secondary | ICD-10-CM | POA: Diagnosis not present

## 2023-01-29 DIAGNOSIS — N898 Other specified noninflammatory disorders of vagina: Secondary | ICD-10-CM | POA: Diagnosis not present

## 2023-01-29 DIAGNOSIS — Z6824 Body mass index (BMI) 24.0-24.9, adult: Secondary | ICD-10-CM | POA: Diagnosis not present

## 2023-01-29 DIAGNOSIS — Z113 Encounter for screening for infections with a predominantly sexual mode of transmission: Secondary | ICD-10-CM | POA: Diagnosis not present

## 2023-04-22 DIAGNOSIS — M25812 Other specified joint disorders, left shoulder: Secondary | ICD-10-CM | POA: Diagnosis not present

## 2023-04-22 DIAGNOSIS — M25512 Pain in left shoulder: Secondary | ICD-10-CM | POA: Diagnosis not present

## 2023-04-22 DIAGNOSIS — M7502 Adhesive capsulitis of left shoulder: Secondary | ICD-10-CM | POA: Diagnosis not present

## 2023-05-20 DIAGNOSIS — R051 Acute cough: Secondary | ICD-10-CM | POA: Diagnosis not present

## 2023-05-20 DIAGNOSIS — J40 Bronchitis, not specified as acute or chronic: Secondary | ICD-10-CM | POA: Diagnosis not present

## 2023-05-20 DIAGNOSIS — J22 Unspecified acute lower respiratory infection: Secondary | ICD-10-CM | POA: Diagnosis not present

## 2023-05-27 DIAGNOSIS — M25812 Other specified joint disorders, left shoulder: Secondary | ICD-10-CM | POA: Diagnosis not present

## 2023-05-27 DIAGNOSIS — M7502 Adhesive capsulitis of left shoulder: Secondary | ICD-10-CM | POA: Diagnosis not present

## 2023-06-01 DIAGNOSIS — M7502 Adhesive capsulitis of left shoulder: Secondary | ICD-10-CM | POA: Diagnosis not present

## 2023-06-01 DIAGNOSIS — M25812 Other specified joint disorders, left shoulder: Secondary | ICD-10-CM | POA: Diagnosis not present

## 2023-06-03 DIAGNOSIS — M7502 Adhesive capsulitis of left shoulder: Secondary | ICD-10-CM | POA: Diagnosis not present

## 2023-06-03 DIAGNOSIS — M25812 Other specified joint disorders, left shoulder: Secondary | ICD-10-CM | POA: Diagnosis not present

## 2023-06-03 DIAGNOSIS — S43422D Sprain of left rotator cuff capsule, subsequent encounter: Secondary | ICD-10-CM | POA: Diagnosis not present

## 2023-06-07 DIAGNOSIS — M7502 Adhesive capsulitis of left shoulder: Secondary | ICD-10-CM | POA: Diagnosis not present

## 2023-06-07 DIAGNOSIS — M25812 Other specified joint disorders, left shoulder: Secondary | ICD-10-CM | POA: Diagnosis not present

## 2023-06-10 DIAGNOSIS — M25812 Other specified joint disorders, left shoulder: Secondary | ICD-10-CM | POA: Diagnosis not present

## 2023-06-10 DIAGNOSIS — M7502 Adhesive capsulitis of left shoulder: Secondary | ICD-10-CM | POA: Diagnosis not present

## 2023-06-14 DIAGNOSIS — M25812 Other specified joint disorders, left shoulder: Secondary | ICD-10-CM | POA: Diagnosis not present

## 2023-06-14 DIAGNOSIS — M7502 Adhesive capsulitis of left shoulder: Secondary | ICD-10-CM | POA: Diagnosis not present

## 2023-06-17 DIAGNOSIS — Z9884 Bariatric surgery status: Secondary | ICD-10-CM | POA: Diagnosis not present

## 2023-06-17 DIAGNOSIS — R790 Abnormal level of blood mineral: Secondary | ICD-10-CM | POA: Diagnosis not present

## 2023-06-17 DIAGNOSIS — Z8639 Personal history of other endocrine, nutritional and metabolic disease: Secondary | ICD-10-CM | POA: Diagnosis not present

## 2023-06-17 DIAGNOSIS — E7800 Pure hypercholesterolemia, unspecified: Secondary | ICD-10-CM | POA: Diagnosis not present

## 2023-06-24 ENCOUNTER — Other Ambulatory Visit (HOSPITAL_COMMUNITY): Payer: Self-pay

## 2023-06-24 MED ORDER — WEGOVY 0.25 MG/0.5ML ~~LOC~~ SOAJ
0.2500 mg | SUBCUTANEOUS | 0 refills | Status: DC
  Filled 2023-06-24 – 2023-06-30 (×3): qty 2, 28d supply, fill #0

## 2023-06-28 ENCOUNTER — Other Ambulatory Visit (HOSPITAL_COMMUNITY): Payer: Self-pay

## 2023-06-28 DIAGNOSIS — X58XXXD Exposure to other specified factors, subsequent encounter: Secondary | ICD-10-CM | POA: Diagnosis not present

## 2023-06-28 DIAGNOSIS — S43422D Sprain of left rotator cuff capsule, subsequent encounter: Secondary | ICD-10-CM | POA: Diagnosis not present

## 2023-06-28 DIAGNOSIS — M25512 Pain in left shoulder: Secondary | ICD-10-CM | POA: Diagnosis not present

## 2023-06-28 DIAGNOSIS — M7502 Adhesive capsulitis of left shoulder: Secondary | ICD-10-CM | POA: Diagnosis not present

## 2023-06-29 ENCOUNTER — Other Ambulatory Visit: Payer: Self-pay

## 2023-06-29 ENCOUNTER — Other Ambulatory Visit (HOSPITAL_BASED_OUTPATIENT_CLINIC_OR_DEPARTMENT_OTHER): Payer: Self-pay

## 2023-06-29 ENCOUNTER — Other Ambulatory Visit (HOSPITAL_COMMUNITY): Payer: Self-pay

## 2023-06-30 ENCOUNTER — Other Ambulatory Visit (HOSPITAL_COMMUNITY): Payer: Self-pay

## 2023-06-30 ENCOUNTER — Other Ambulatory Visit: Payer: Self-pay

## 2023-07-05 DIAGNOSIS — M7502 Adhesive capsulitis of left shoulder: Secondary | ICD-10-CM | POA: Diagnosis not present

## 2023-07-05 DIAGNOSIS — S43422D Sprain of left rotator cuff capsule, subsequent encounter: Secondary | ICD-10-CM | POA: Diagnosis not present

## 2023-07-05 DIAGNOSIS — M25812 Other specified joint disorders, left shoulder: Secondary | ICD-10-CM | POA: Diagnosis not present

## 2023-07-13 DIAGNOSIS — M7502 Adhesive capsulitis of left shoulder: Secondary | ICD-10-CM | POA: Diagnosis not present

## 2023-07-13 DIAGNOSIS — M25812 Other specified joint disorders, left shoulder: Secondary | ICD-10-CM | POA: Diagnosis not present

## 2023-07-19 DIAGNOSIS — M7502 Adhesive capsulitis of left shoulder: Secondary | ICD-10-CM | POA: Diagnosis not present

## 2023-07-19 DIAGNOSIS — M25812 Other specified joint disorders, left shoulder: Secondary | ICD-10-CM | POA: Diagnosis not present

## 2023-07-27 ENCOUNTER — Other Ambulatory Visit (HOSPITAL_COMMUNITY): Payer: Self-pay

## 2023-07-27 MED ORDER — WEGOVY 0.5 MG/0.5ML ~~LOC~~ SOAJ
0.5000 mL | SUBCUTANEOUS | 0 refills | Status: DC
  Filled 2023-07-27 – 2023-07-28 (×2): qty 2, 28d supply, fill #0

## 2023-07-28 ENCOUNTER — Other Ambulatory Visit (HOSPITAL_COMMUNITY): Payer: Self-pay

## 2023-07-28 ENCOUNTER — Other Ambulatory Visit: Payer: Self-pay

## 2023-07-30 DIAGNOSIS — M7502 Adhesive capsulitis of left shoulder: Secondary | ICD-10-CM | POA: Diagnosis not present

## 2023-07-30 DIAGNOSIS — M25812 Other specified joint disorders, left shoulder: Secondary | ICD-10-CM | POA: Diagnosis not present

## 2023-08-05 DIAGNOSIS — M7502 Adhesive capsulitis of left shoulder: Secondary | ICD-10-CM | POA: Diagnosis not present

## 2023-08-05 DIAGNOSIS — M25812 Other specified joint disorders, left shoulder: Secondary | ICD-10-CM | POA: Diagnosis not present

## 2023-08-12 DIAGNOSIS — M7502 Adhesive capsulitis of left shoulder: Secondary | ICD-10-CM | POA: Diagnosis not present

## 2023-08-12 DIAGNOSIS — M25812 Other specified joint disorders, left shoulder: Secondary | ICD-10-CM | POA: Diagnosis not present

## 2023-08-18 ENCOUNTER — Other Ambulatory Visit (HOSPITAL_COMMUNITY): Payer: Self-pay

## 2023-08-18 DIAGNOSIS — M25812 Other specified joint disorders, left shoulder: Secondary | ICD-10-CM | POA: Diagnosis not present

## 2023-08-18 DIAGNOSIS — M7502 Adhesive capsulitis of left shoulder: Secondary | ICD-10-CM | POA: Diagnosis not present

## 2023-08-18 DIAGNOSIS — S43422D Sprain of left rotator cuff capsule, subsequent encounter: Secondary | ICD-10-CM | POA: Diagnosis not present

## 2023-08-18 MED ORDER — WEGOVY 1 MG/0.5ML ~~LOC~~ SOAJ
1.0000 mg | SUBCUTANEOUS | 0 refills | Status: DC
  Filled 2023-08-18: qty 2, 28d supply, fill #0

## 2023-09-16 ENCOUNTER — Other Ambulatory Visit (HOSPITAL_COMMUNITY): Payer: Self-pay

## 2023-09-16 ENCOUNTER — Other Ambulatory Visit: Payer: Self-pay

## 2023-09-16 MED ORDER — WEGOVY 1.7 MG/0.75ML ~~LOC~~ SOAJ
1.7000 mg | SUBCUTANEOUS | 0 refills | Status: DC
  Filled 2023-09-16 (×2): qty 3, 28d supply, fill #0

## 2023-10-06 DIAGNOSIS — U071 COVID-19: Secondary | ICD-10-CM | POA: Diagnosis not present

## 2023-10-12 ENCOUNTER — Encounter: Payer: BC Managed Care – PPO | Admitting: Medical

## 2023-10-18 ENCOUNTER — Other Ambulatory Visit: Payer: Self-pay

## 2023-10-18 ENCOUNTER — Other Ambulatory Visit (HOSPITAL_BASED_OUTPATIENT_CLINIC_OR_DEPARTMENT_OTHER): Payer: Self-pay

## 2023-10-18 MED ORDER — WEGOVY 2.4 MG/0.75ML ~~LOC~~ SOAJ
2.4000 mg | SUBCUTANEOUS | 1 refills | Status: DC
  Filled 2023-10-18: qty 3, 28d supply, fill #0
  Filled 2023-12-09: qty 3, 28d supply, fill #1

## 2023-11-03 DIAGNOSIS — M25812 Other specified joint disorders, left shoulder: Secondary | ICD-10-CM | POA: Diagnosis not present

## 2023-11-03 DIAGNOSIS — S43422D Sprain of left rotator cuff capsule, subsequent encounter: Secondary | ICD-10-CM | POA: Diagnosis not present

## 2023-11-03 DIAGNOSIS — M7552 Bursitis of left shoulder: Secondary | ICD-10-CM | POA: Diagnosis not present

## 2023-11-03 DIAGNOSIS — S43422A Sprain of left rotator cuff capsule, initial encounter: Secondary | ICD-10-CM | POA: Diagnosis not present

## 2023-11-03 DIAGNOSIS — M7502 Adhesive capsulitis of left shoulder: Secondary | ICD-10-CM | POA: Diagnosis not present

## 2023-11-03 DIAGNOSIS — X58XXXA Exposure to other specified factors, initial encounter: Secondary | ICD-10-CM | POA: Diagnosis not present

## 2023-11-09 HISTORY — PX: SHOULDER SURGERY: SHX246

## 2023-11-11 ENCOUNTER — Other Ambulatory Visit (HOSPITAL_COMMUNITY): Payer: Self-pay

## 2023-11-11 MED ORDER — WEGOVY 2.4 MG/0.75ML ~~LOC~~ SOAJ
2.4000 mg | SUBCUTANEOUS | 1 refills | Status: DC
  Filled 2023-11-11: qty 3, 28d supply, fill #0
  Filled 2023-12-14: qty 3, 28d supply, fill #1

## 2023-11-12 ENCOUNTER — Other Ambulatory Visit: Payer: Self-pay

## 2023-11-16 DIAGNOSIS — X58XXXA Exposure to other specified factors, initial encounter: Secondary | ICD-10-CM | POA: Diagnosis not present

## 2023-11-16 DIAGNOSIS — S43422A Sprain of left rotator cuff capsule, initial encounter: Secondary | ICD-10-CM | POA: Diagnosis not present

## 2023-11-16 DIAGNOSIS — M7542 Impingement syndrome of left shoulder: Secondary | ICD-10-CM | POA: Diagnosis not present

## 2023-11-16 DIAGNOSIS — M75112 Incomplete rotator cuff tear or rupture of left shoulder, not specified as traumatic: Secondary | ICD-10-CM | POA: Diagnosis not present

## 2023-11-16 DIAGNOSIS — M25812 Other specified joint disorders, left shoulder: Secondary | ICD-10-CM | POA: Diagnosis not present

## 2023-11-16 DIAGNOSIS — G8918 Other acute postprocedural pain: Secondary | ICD-10-CM | POA: Diagnosis not present

## 2023-11-16 DIAGNOSIS — M7552 Bursitis of left shoulder: Secondary | ICD-10-CM | POA: Diagnosis not present

## 2023-11-16 DIAGNOSIS — M7502 Adhesive capsulitis of left shoulder: Secondary | ICD-10-CM | POA: Diagnosis not present

## 2023-11-24 DIAGNOSIS — M25612 Stiffness of left shoulder, not elsewhere classified: Secondary | ICD-10-CM | POA: Diagnosis not present

## 2023-11-24 DIAGNOSIS — S43422D Sprain of left rotator cuff capsule, subsequent encounter: Secondary | ICD-10-CM | POA: Diagnosis not present

## 2023-11-24 DIAGNOSIS — M25512 Pain in left shoulder: Secondary | ICD-10-CM | POA: Diagnosis not present

## 2023-11-24 DIAGNOSIS — X58XXXD Exposure to other specified factors, subsequent encounter: Secondary | ICD-10-CM | POA: Diagnosis not present

## 2023-11-30 DIAGNOSIS — S43422D Sprain of left rotator cuff capsule, subsequent encounter: Secondary | ICD-10-CM | POA: Diagnosis not present

## 2023-12-01 DIAGNOSIS — X58XXXD Exposure to other specified factors, subsequent encounter: Secondary | ICD-10-CM | POA: Diagnosis not present

## 2023-12-01 DIAGNOSIS — M25612 Stiffness of left shoulder, not elsewhere classified: Secondary | ICD-10-CM | POA: Diagnosis not present

## 2023-12-01 DIAGNOSIS — S43422D Sprain of left rotator cuff capsule, subsequent encounter: Secondary | ICD-10-CM | POA: Diagnosis not present

## 2023-12-01 DIAGNOSIS — M25512 Pain in left shoulder: Secondary | ICD-10-CM | POA: Diagnosis not present

## 2023-12-06 DIAGNOSIS — X58XXXD Exposure to other specified factors, subsequent encounter: Secondary | ICD-10-CM | POA: Diagnosis not present

## 2023-12-06 DIAGNOSIS — S43422D Sprain of left rotator cuff capsule, subsequent encounter: Secondary | ICD-10-CM | POA: Diagnosis not present

## 2023-12-06 DIAGNOSIS — M25512 Pain in left shoulder: Secondary | ICD-10-CM | POA: Diagnosis not present

## 2023-12-06 DIAGNOSIS — M25612 Stiffness of left shoulder, not elsewhere classified: Secondary | ICD-10-CM | POA: Diagnosis not present

## 2023-12-08 DIAGNOSIS — M25612 Stiffness of left shoulder, not elsewhere classified: Secondary | ICD-10-CM | POA: Diagnosis not present

## 2023-12-08 DIAGNOSIS — S43422D Sprain of left rotator cuff capsule, subsequent encounter: Secondary | ICD-10-CM | POA: Diagnosis not present

## 2023-12-08 DIAGNOSIS — X58XXXD Exposure to other specified factors, subsequent encounter: Secondary | ICD-10-CM | POA: Diagnosis not present

## 2023-12-08 DIAGNOSIS — M25512 Pain in left shoulder: Secondary | ICD-10-CM | POA: Diagnosis not present

## 2023-12-09 ENCOUNTER — Other Ambulatory Visit (HOSPITAL_COMMUNITY): Payer: Self-pay

## 2023-12-09 ENCOUNTER — Other Ambulatory Visit: Payer: Self-pay

## 2023-12-09 ENCOUNTER — Encounter: Payer: Self-pay | Admitting: Medical

## 2023-12-09 ENCOUNTER — Ambulatory Visit: Admitting: Medical

## 2023-12-09 VITALS — BP 120/70 | HR 74 | Ht 61.0 in | Wt 142.8 lb

## 2023-12-09 DIAGNOSIS — Z113 Encounter for screening for infections with a predominantly sexual mode of transmission: Secondary | ICD-10-CM | POA: Diagnosis not present

## 2023-12-09 DIAGNOSIS — E782 Mixed hyperlipidemia: Secondary | ICD-10-CM

## 2023-12-09 DIAGNOSIS — Z1159 Encounter for screening for other viral diseases: Secondary | ICD-10-CM | POA: Diagnosis not present

## 2023-12-09 DIAGNOSIS — Z8249 Family history of ischemic heart disease and other diseases of the circulatory system: Secondary | ICD-10-CM

## 2023-12-09 DIAGNOSIS — D649 Anemia, unspecified: Secondary | ICD-10-CM | POA: Diagnosis not present

## 2023-12-09 DIAGNOSIS — R059 Cough, unspecified: Secondary | ICD-10-CM

## 2023-12-09 DIAGNOSIS — R7301 Impaired fasting glucose: Secondary | ICD-10-CM | POA: Diagnosis not present

## 2023-12-09 DIAGNOSIS — Z0000 Encounter for general adult medical examination without abnormal findings: Secondary | ICD-10-CM

## 2023-12-09 LAB — LIPID PANEL

## 2023-12-09 MED ORDER — FLUTICASONE-SALMETEROL 115-21 MCG/ACT IN AERO: 2.0000 | INHALATION_SPRAY | Freq: Two times a day (BID) | RESPIRATORY_TRACT | 2 refills | Status: AC

## 2023-12-09 MED ORDER — BENZONATATE 200 MG PO CAPS: 200.0000 mg | ORAL_CAPSULE | Freq: Three times a day (TID) | ORAL | 0 refills | Status: AC | PRN

## 2023-12-09 MED ORDER — ALBUTEROL SULFATE HFA 108 (90 BASE) MCG/ACT IN AERS: 2.0000 | INHALATION_SPRAY | Freq: Four times a day (QID) | RESPIRATORY_TRACT | 1 refills | Status: AC | PRN

## 2023-12-09 NOTE — Progress Notes (Signed)
 Subjective:   HPI  Laura Cross is a 38 y.o. female who presents for Chief Complaint  Patient presents with   Annual Exam    Nonfasting cpe, no concerns. Sees obygn    Patient Care Team: Gracelin Weisberg, Newt Barefoot as PCP - General (Family Medicine) Ob/Gyn, [facility] (Obstetrics and Gynecology) Sees dentist Sees eye doctor Dr. Aldean Hummingbird,  Loura Rower, PA, [facility] [place] [facility], bariatrics [place], [place]  Concerns: Here for well visit  She notes ongoing cough for 1+ month. Could be allergy related.  She had covid in 09/2023 and this could be residual from covid.  No fever.  Using inhaler some.  No specific hx/o asthma, but has had to use inhaler prior.  Had recent surgery on left shoulder last month for bursitis, bone spur  Sees eye doctor and dentist  Working from home [facility].    Sees bariatric clinic, hx/o sleeve bypass and using wegovy  to maintain weight stable  Not currently taking vytorin .    No other c/o   Reviewed their medical, surgical, family, social, medication, and allergy history and updated chart as appropriate.  Past Medical History:  Diagnosis Date   Anemia    Elevated liver enzymes    Family history of premature CAD    father   Fibroids    Impaired fasting blood sugar    Miscarriage 2014   Mixed dyslipidemia    Ovarian cyst    Pre-diabetes    Sexually transmitted disease (STD)    hx/o HPV, gonorrhea, chlamydia, genital herpes   Syphilis 2010    Family History  Problem Relation Age of Onset   Stroke Mother    GER disease Mother    Deep vein thrombosis Mother        with MVA   Heart disease Mother 75       LVH   Heart disease Father 39       died MI   Pneumonia Brother    Cancer Maternal Aunt        breast   Hypertension Maternal Grandmother    Diabetes Maternal Grandmother    Stroke Maternal Grandmother    Heart disease Maternal Grandmother    Hypertension Maternal Grandfather    COPD Maternal Grandfather     Cancer Maternal Grandfather 60       colon   COPD Paternal Grandfather    Cancer Cousin        breast     Current Outpatient Medications:    albuterol  (VENTOLIN  HFA) 108 (90 Base) MCG/ACT inhaler, Inhale 2 puffs into the lungs every 6 (six) hours as needed for wheezing or shortness of breath., Disp: 18 g, Rfl: 1   benzonatate  (TESSALON ) 200 MG capsule, Take 1 capsule (200 mg total) by mouth 3 (three) times daily as needed for cough., Disp: 30 capsule, Rfl: 0   calcium  gluconate 500 MG tablet, Take 1 tablet by mouth 3 (three) times daily., Disp: , Rfl:    fluticasone -salmeterol (ADVAIR HFA) 115-21 MCG/ACT inhaler, Inhale 2 puffs into the lungs 2 (two) times daily., Disp: 1 each, Rfl: 2   Multiple Vitamins-Minerals (ADULT ONE DAILY GUMMIES PO), Take 2 tablets by mouth every evening., Disp: , Rfl:    Semaglutide -Weight Management (WEGOVY ) 2.4 MG/0.75ML SOAJ, Inject 2.4 mg ( 0.30mls) into the skin once a week., Disp: 3 mL, Rfl: 1   valACYclovir  (VALTREX ) 1000 MG tablet, Take 2 tablets (2,000 mg total) by mouth 2 (two) times daily as needed (flare ups).,  Disp: 60 tablet, Rfl: 3   DIGESTIVE ENZYMES PO, Take by mouth., Disp: , Rfl:    ezetimibe -simvastatin  (VYTORIN ) 10-40 MG tablet, Take 1 tablet by mouth at bedtime. (Patient not taking: Reported on 12/09/2023), Disp: 90 tablet, Rfl: 3  Allergies  Allergen Reactions   Meloxicam  Other (See Comments)    Bleeding?  Can tolerate other NSAIDs   Metformin  And Related Diarrhea and Other (See Comments)    hair falling out   Nuvaring [Etonogestrel-Ethinyl Estradiol] Other (See Comments)    Flu-like symptoms    Review of Systems  Constitutional:  Negative for chills, fever, malaise/fatigue and weight loss.  HENT:  Negative for congestion, ear pain, hearing loss, sore throat and tinnitus.   Eyes:  Negative for blurred vision, pain and redness.  Respiratory:  Positive for cough. Negative for hemoptysis and shortness of breath.   Cardiovascular:   Negative for chest pain, palpitations, orthopnea, claudication and leg swelling.  Gastrointestinal:  Positive for abdominal pain. Negative for blood in stool, constipation, diarrhea, nausea and vomiting.  Genitourinary:  Negative for dysuria, flank pain, frequency, hematuria and urgency.  Musculoskeletal:  Negative for falls, joint pain and myalgias.  Skin:  Negative for itching and rash.  Neurological:  Negative for dizziness, tingling, speech change, weakness and headaches.  Endo/Heme/Allergies:  Negative for polydipsia. Does not bruise/bleed easily.  Psychiatric/Behavioral:  Negative for depression and memory loss. The patient is not nervous/anxious and does not have insomnia.         12/09/2023    3:29 PM 10/09/2022    9:47 AM 09/09/2021    8:32 AM 06/04/2021   11:20 AM 05/06/2021   11:35 AM  Depression screen PHQ 2/9  Decreased Interest 0 0 0 0 0  Down, Depressed, Hopeless 0 0 0 0 0  PHQ - 2 Score 0 0 0 0 0       Objective:  BP 120/70   Pulse 74   Ht 5\' 1"  (1.549 m)   Wt 142 lb 12.8 oz (64.8 kg)   BMI 26.98 kg/m   Wt Readings from Last 3 Encounters:  12/09/23 142 lb 12.8 oz (64.8 kg)  10/09/22 137 lb 6.4 oz (62.3 kg)  01/15/22 173 lb (78.5 kg)   BP Readings from Last 3 Encounters:  12/09/23 120/70  10/09/22 110/70  11/05/21 120/70   General appearance: alert, no distress, WD/WN, African American female Skin: unremarkable, tattoos right upper chest, right upper arm, tattoo of crown right forearm HEENT: normocephalic, conjunctiva/corneas normal, sclerae anicteric, PERRLA, EOMi Neck: supple, no lymphadenopathy, no thyromegaly, no masses, normal ROM, no bruits Chest: non tender, normal shape and expansion Heart: RRR, normal S1, S2, no murmurs Lungs: CTA bilaterally, no wheezes, rhonchi, or rales Abdomen: +bs, soft, non tender, non distended, no masses, no hepatomegaly, no splenomegaly, no bruits Back: non tender, normal ROM, no scoliosis Musculoskeletal: upper  extremities non tender, no obvious deformity, normal ROM throughout, lower extremities non tender, no obvious deformity, normal ROM throughout Extremities: no edema, no cyanosis, no clubbing Pulses: 2+ symmetric, upper and lower extremities, normal cap refill Neurological: alert, oriented x 3, CN2-12 intact, strength normal upper extremities and lower extremities, sensation normal throughout, DTRs 2+ throughout, no cerebellar signs, gait normal Psychiatric: normal affect, behavior normal, pleasant  Breast/gyn/rectal - deferred to gynecology    Assessment and Plan :   Encounter Diagnoses  Name Primary?   Encounter for health maintenance examination in adult Yes   Anemia, unspecified type    Screen for STD (sexually  transmitted disease)    Mixed dyslipidemia    Impaired fasting blood sugar    Family history of premature CAD    Cough, unspecified type     This visit was a preventative care visit, also known as wellness visit or routine physical.   Topics typically include healthy lifestyle, diet, exercise, preventative care, vaccinations, sick and well care, proper use of emergency dept and after hours care, as well as other concerns.     Recommendations: Continue to return yearly for your annual wellness and preventative care visits.  This gives us  a chance to discuss healthy lifestyle, exercise, vaccinations, review your chart record, and perform screenings where appropriate.  I recommend you see your eye doctor yearly for routine vision care.  I recommend you see your dentist yearly for routine dental care including hygiene visits twice yearly.   Vaccination recommendations were reviewed Immunization History  Administered Date(s) Administered   HPV 9-valent 10/09/2022   HPV Quadrivalent 04/27/2012, 06/27/2012   PFIZER Comirnaty(Gray Top)Covid-19 Tri-Sucrose Vaccine 11/12/2020, 12/10/2020   Td 06/05/2015   Tdap 01/21/2018     Screening for cancer: Colon cancer  screening: Age 1  Breast cancer screening: You should perform a self breast exam monthly.   We reviewed recommendations for regular mammograms and breast cancer screening.  Cervical cancer screening: We reviewed recommendations for pap smear screening.   Skin cancer screening: Check your skin regularly for new changes, growing lesions, or other lesions of concern Come in for evaluation if you have skin lesions of concern.  Lung cancer screening: If you have a greater than 20 pack year history of tobacco use, then you may qualify for lung cancer screening with a chest CT scan.   Please call your insurance company to inquire about coverage for this test.  We currently don't have screenings for other cancers besides breast, cervical, colon, and lung cancers.  If you have a strong family history of cancer or have other cancer screening concerns, please let me know.    Bone health: Get at least 150 minutes of aerobic exercise weekly Get weight bearing exercise at least once weekly Bone density test:  A bone density test is an imaging test that uses a type of X-ray to measure the amount of calcium  and other minerals in your bones. The test may be used to diagnose or screen you for a condition that causes weak or thin bones (osteoporosis), predict your risk for a broken bone (fracture), or determine how well your osteoporosis treatment is working. The bone density test is recommended for females 65 and older, or females or males <65 if certain risk factors such as thyroid  disease, long term use of steroids such as for asthma or rheumatological issues, vitamin D  deficiency, estrogen deficiency, family history of osteoporosis, self or family history of fragility fracture in first degree relative.    Heart health: Get at least 150 minutes of aerobic exercise weekly Limit alcohol It is important to maintain a healthy blood pressure and healthy cholesterol numbers  Heart disease  screening: Screening for heart disease includes screening for blood pressure, fasting lipids, glucose/diabetes screening, BMI height to weight ratio, reviewed of smoking status, physical activity, and diet.    Goals include blood pressure 120/80 or less, maintaining a healthy lipid/cholesterol profile, preventing diabetes or keeping diabetes numbers under good control, not smoking or using tobacco products, exercising most days per week or at least 150 minutes per week of exercise, and eating healthy variety of fruits and vegetables,  healthy oils, and avoiding unhealthy food choices like fried food, fast food, high sugar and high cholesterol foods.    Other tests may possibly include EKG test, CT coronary calcium  score, echocardiogram, exercise treadmill stress test.    Medical care options: I recommend you continue to seek care here first for routine care.  We try really hard to have available appointments Monday through Friday daytime hours for sick visits, acute visits, and physicals.  Urgent care should be used for after hours and weekends for significant issues that cannot wait till the next day.  The emergency department should be used for significant potentially life-threatening emergencies.  The emergency department is expensive, can often have long wait times for less significant concerns, so try to utilize primary care, urgent care, or telemedicine when possible to avoid unnecessary trips to the emergency department.  Virtual visits and telemedicine have been introduced since the pandemic started in 2020, and can be convenient ways to receive medical care.  We offer virtual appointments as well to assist you in a variety of options to seek medical care.    Separate significant issues discussed: Doing well with wegovy , s/p sleeve bypass and healthy eating habits  Prior dyslipidemia-updated labs today fasting, discussed diet, prevention.  She hasn't been eager to use medication prior for  this.  Consider CT coronary test age 70  Genital herpes-uses Valtrex  prn, but no recent issues  Recent anemia on labs 11/2023, additional labs today  Cough - begin trial of advair, continue albuterol  prn, begin OTC antihistamine at bedtime for next month.  Go for chest xray   Deiona was seen today for annual exam.  Diagnoses and all orders for this visit:  Encounter for health maintenance examination in adult -     Lipid panel -     Hemoglobin A1c -     Iron, TIBC and Ferritin Panel -     Hepatic function panel -     CBC with Differential/Platelet -     RPR+HIV+GC+CT Panel -     Hepatitis C antibody -     Hepatitis B surface antigen -     HSV-2 Ab, IgG  Anemia, unspecified type -     Iron, TIBC and Ferritin Panel -     CBC with Differential/Platelet  Screen for STD (sexually transmitted disease) -     RPR+HIV+GC+CT Panel -     Hepatitis C antibody -     Hepatitis B surface antigen -     HSV-2 Ab, IgG  Mixed dyslipidemia -     Lipid panel  Impaired fasting blood sugar -     Hemoglobin A1c  Family history of premature CAD  Cough, unspecified type  Other orders -     benzonatate  (TESSALON ) 200 MG capsule; Take 1 capsule (200 mg total) by mouth 3 (three) times daily as needed for cough. -     fluticasone -salmeterol (ADVAIR HFA) 115-21 MCG/ACT inhaler; Inhale 2 puffs into the lungs 2 (two) times daily. -     albuterol  (VENTOLIN  HFA) 108 (90 Base) MCG/ACT inhaler; Inhale 2 puffs into the lungs every 6 (six) hours as needed for wheezing or shortness of breath.    Follow-up pending labs, yearly for physical

## 2023-12-10 ENCOUNTER — Other Ambulatory Visit (HOSPITAL_COMMUNITY): Payer: Self-pay

## 2023-12-10 NOTE — Progress Notes (Signed)
 Iron levels are quite low.  Cholesterol does not look great either.  Fortunately you are not anemic. your hemoglobin is still okay.  Diabetes marker normal.  Liver test marker normal.  Negative for syphilis.  Negative hepatitis C.  Negative hepatitis B.  Still pending HIV gonorrhea and chlamydia.    Recommendations 1-I recommend an iron infusion and taking oral iron daily 2-I do recommend getting back on cholesterol medicine to lower your overall risk of heart disease and stroke.  I will send this to the pharmacy 3-if not improving in regards to the cough within the next month then we need to get a chest x-ray.  Regarding the iron infusion, if agreeable we will try to find a facility close to you that does this.  You would need to have your iron rechecked in 1 to 2 months  What iron have you tolerated in the past?  Fergon, ferrous gluconate or ferrous sulfate  or have you had problems with oral iron in the past?

## 2023-12-13 LAB — CBC WITH DIFFERENTIAL/PLATELET
Basophils Absolute: 0 10*3/uL (ref 0.0–0.2)
Basos: 1 %
EOS (ABSOLUTE): 0.1 10*3/uL (ref 0.0–0.4)
Eos: 3 %
Hematocrit: 37.1 % (ref 34.0–46.6)
Hemoglobin: 11.3 g/dL (ref 11.1–15.9)
Immature Grans (Abs): 0 10*3/uL (ref 0.0–0.1)
Immature Granulocytes: 0 %
Lymphocytes Absolute: 1.8 10*3/uL (ref 0.7–3.1)
Lymphs: 41 %
MCH: 25.1 pg — ABNORMAL LOW (ref 26.6–33.0)
MCHC: 30.5 g/dL — ABNORMAL LOW (ref 31.5–35.7)
MCV: 82 fL (ref 79–97)
Monocytes Absolute: 0.4 10*3/uL (ref 0.1–0.9)
Monocytes: 9 %
Neutrophils Absolute: 2 10*3/uL (ref 1.4–7.0)
Neutrophils: 46 %
Platelets: 293 10*3/uL (ref 150–450)
RBC: 4.5 x10E6/uL (ref 3.77–5.28)
RDW: 14.7 % (ref 11.7–15.4)
WBC: 4.4 10*3/uL (ref 3.4–10.8)

## 2023-12-13 LAB — IRON,TIBC AND FERRITIN PANEL
Ferritin: 10 ng/mL — ABNORMAL LOW (ref 15–150)
Iron Saturation: 4 % — CL (ref 15–55)
Iron: 16 ug/dL — ABNORMAL LOW (ref 27–159)
Total Iron Binding Capacity: 382 ug/dL (ref 250–450)
UIBC: 366 ug/dL (ref 131–425)

## 2023-12-13 LAB — HEPATIC FUNCTION PANEL
ALT: 18 IU/L (ref 0–32)
AST: 18 IU/L (ref 0–40)
Albumin: 4.5 g/dL (ref 3.9–4.9)
Alkaline Phosphatase: 81 IU/L (ref 44–121)
Bilirubin Total: <0.2 mg/dL (ref 0.0–1.2)
Bilirubin, Direct: <0.08 mg/dL (ref 0.00–0.40)
Total Protein: 7.2 g/dL (ref 6.0–8.5)

## 2023-12-13 LAB — RPR+HIV+GC+CT PANEL
HIV Screen 4th Generation wRfx: NONREACTIVE
RPR Ser Ql: NONREACTIVE

## 2023-12-13 LAB — LIPID PANEL
Chol/HDL Ratio: 4.2 ratio (ref 0.0–4.4)
Cholesterol, Total: 222 mg/dL — ABNORMAL HIGH (ref 100–199)
HDL: 53 mg/dL (ref >39)
LDL Chol Calc (NIH): 142 mg/dL — ABNORMAL HIGH (ref 0–99)
Triglycerides: 152 mg/dL — ABNORMAL HIGH (ref 0–149)
VLDL Cholesterol Cal: 27 mg/dL (ref 5–40)

## 2023-12-13 LAB — HEMOGLOBIN A1C
Est. average glucose Bld gHb Est-mCnc: 105 mg/dL
Hgb A1c MFr Bld: 5.3 % (ref 4.8–5.6)

## 2023-12-13 LAB — HEPATITIS B SURFACE ANTIGEN: Hepatitis B Surface Ag: NEGATIVE

## 2023-12-13 LAB — HEPATITIS C ANTIBODY: Hep C Virus Ab: NONREACTIVE

## 2023-12-13 LAB — HSV-2 AB, IGG: HSV 2 IgG, Type Spec: REACTIVE — AB

## 2023-12-13 NOTE — Progress Notes (Signed)
 Results sent through MyChart

## 2023-12-14 ENCOUNTER — Other Ambulatory Visit: Payer: Self-pay

## 2023-12-14 DIAGNOSIS — M25512 Pain in left shoulder: Secondary | ICD-10-CM | POA: Diagnosis not present

## 2023-12-14 DIAGNOSIS — M25612 Stiffness of left shoulder, not elsewhere classified: Secondary | ICD-10-CM | POA: Diagnosis not present

## 2023-12-14 DIAGNOSIS — S43422D Sprain of left rotator cuff capsule, subsequent encounter: Secondary | ICD-10-CM | POA: Diagnosis not present

## 2023-12-14 DIAGNOSIS — X58XXXD Exposure to other specified factors, subsequent encounter: Secondary | ICD-10-CM | POA: Diagnosis not present

## 2023-12-16 DIAGNOSIS — M25512 Pain in left shoulder: Secondary | ICD-10-CM | POA: Diagnosis not present

## 2023-12-16 DIAGNOSIS — X58XXXD Exposure to other specified factors, subsequent encounter: Secondary | ICD-10-CM | POA: Diagnosis not present

## 2023-12-16 DIAGNOSIS — M25612 Stiffness of left shoulder, not elsewhere classified: Secondary | ICD-10-CM | POA: Diagnosis not present

## 2023-12-16 DIAGNOSIS — S43422D Sprain of left rotator cuff capsule, subsequent encounter: Secondary | ICD-10-CM | POA: Diagnosis not present

## 2023-12-17 DIAGNOSIS — Z9889 Other specified postprocedural states: Secondary | ICD-10-CM | POA: Diagnosis not present

## 2023-12-23 ENCOUNTER — Telehealth: Payer: Self-pay | Admitting: Internal Medicine

## 2023-12-23 DIAGNOSIS — M25612 Stiffness of left shoulder, not elsewhere classified: Secondary | ICD-10-CM | POA: Diagnosis not present

## 2023-12-23 DIAGNOSIS — M25512 Pain in left shoulder: Secondary | ICD-10-CM | POA: Diagnosis not present

## 2023-12-23 DIAGNOSIS — X58XXXD Exposure to other specified factors, subsequent encounter: Secondary | ICD-10-CM | POA: Diagnosis not present

## 2023-12-23 DIAGNOSIS — S43422D Sprain of left rotator cuff capsule, subsequent encounter: Secondary | ICD-10-CM | POA: Diagnosis not present

## 2023-12-23 NOTE — Telephone Encounter (Unsigned)
 Copied from CRM [id number]. Topic: Clinical - Medication Question >> Dec 23, 2023 10:28 AM Lotus Round B wrote: Reason for CRM: pt called in because she had a physical 12/09/2023 with Dr.Tysinger and he was going to put her on a medication for her cholesterol. pt haven't heard anything back about it . Just was checking to see if she still needs to take it . If Dr.Tysinger or nurse can give her a call about this matter if possible . [phone]

## 2023-12-24 ENCOUNTER — Other Ambulatory Visit (HOSPITAL_COMMUNITY): Payer: Self-pay

## 2023-12-24 ENCOUNTER — Telehealth: Payer: Self-pay

## 2023-12-24 ENCOUNTER — Encounter: Payer: Self-pay | Admitting: Medical

## 2023-12-24 ENCOUNTER — Other Ambulatory Visit: Payer: Self-pay | Admitting: Medical

## 2023-12-24 DIAGNOSIS — E611 Iron deficiency: Secondary | ICD-10-CM | POA: Diagnosis not present

## 2023-12-24 DIAGNOSIS — R7303 Prediabetes: Secondary | ICD-10-CM | POA: Diagnosis not present

## 2023-12-24 DIAGNOSIS — Z9884 Bariatric surgery status: Secondary | ICD-10-CM | POA: Diagnosis not present

## 2023-12-24 DIAGNOSIS — E7800 Pure hypercholesterolemia, unspecified: Secondary | ICD-10-CM | POA: Diagnosis not present

## 2023-12-24 MED ORDER — WEGOVY 1.7 MG/0.75ML ~~LOC~~ SOAJ
1.7000 mg | SUBCUTANEOUS | 1 refills | Status: DC
  Filled 2023-12-24 – 2024-01-20 (×5): qty 3, 28d supply, fill #0
  Filled 2024-02-14: qty 3, 28d supply, fill #1

## 2023-12-24 MED ORDER — EZETIMIBE-SIMVASTATIN 10-40 MG PO TABS: 1.0000 | ORAL_TABLET | Freq: Every day | ORAL | 3 refills | Status: AC

## 2023-12-24 NOTE — Telephone Encounter (Signed)
 Dr. Azalea Lento and Irena Manners, patient will be scheduled as soon as possible.  Auth Submission: NO AUTH NEEDED Site of care: Site of care: [facility] Payer: [employer] commercial Medication & CPT/J Code(s) submitted: Feraheme (ferumoxytol) [id number] Route of submission (phone, fax, portal): phone Phone # [phone] Fax # Auth type: Buy/Bill PB Units/visits requested: 510mg  x 1 dose Reference number: [id number] Approval from: 12/24/23 to 03/25/24

## 2023-12-24 NOTE — Telephone Encounter (Signed)
 Pt was notified.   I also went ahead and put order in for Iron infusion here. She will also check at home too to see if she can find a place

## 2023-12-24 NOTE — Telephone Encounter (Signed)
 Pt was notified.

## 2023-12-31 DIAGNOSIS — M25512 Pain in left shoulder: Secondary | ICD-10-CM | POA: Diagnosis not present

## 2023-12-31 DIAGNOSIS — M25612 Stiffness of left shoulder, not elsewhere classified: Secondary | ICD-10-CM | POA: Diagnosis not present

## 2023-12-31 DIAGNOSIS — X58XXXD Exposure to other specified factors, subsequent encounter: Secondary | ICD-10-CM | POA: Diagnosis not present

## 2023-12-31 DIAGNOSIS — S43422D Sprain of left rotator cuff capsule, subsequent encounter: Secondary | ICD-10-CM | POA: Diagnosis not present

## 2024-01-07 DIAGNOSIS — M25612 Stiffness of left shoulder, not elsewhere classified: Secondary | ICD-10-CM | POA: Diagnosis not present

## 2024-01-07 DIAGNOSIS — M25512 Pain in left shoulder: Secondary | ICD-10-CM | POA: Diagnosis not present

## 2024-01-07 DIAGNOSIS — X58XXXD Exposure to other specified factors, subsequent encounter: Secondary | ICD-10-CM | POA: Diagnosis not present

## 2024-01-07 DIAGNOSIS — S43422D Sprain of left rotator cuff capsule, subsequent encounter: Secondary | ICD-10-CM | POA: Diagnosis not present

## 2024-01-10 ENCOUNTER — Other Ambulatory Visit (HOSPITAL_COMMUNITY): Payer: Self-pay

## 2024-01-13 ENCOUNTER — Other Ambulatory Visit (HOSPITAL_COMMUNITY): Payer: Self-pay

## 2024-01-18 ENCOUNTER — Encounter: Payer: Self-pay | Admitting: Medical

## 2024-01-18 ENCOUNTER — Other Ambulatory Visit (HOSPITAL_COMMUNITY): Payer: Self-pay

## 2024-01-20 ENCOUNTER — Other Ambulatory Visit (HOSPITAL_COMMUNITY): Payer: Self-pay

## 2024-01-20 ENCOUNTER — Other Ambulatory Visit: Payer: Self-pay

## 2024-01-20 DIAGNOSIS — M25612 Stiffness of left shoulder, not elsewhere classified: Secondary | ICD-10-CM | POA: Diagnosis not present

## 2024-01-20 DIAGNOSIS — M25512 Pain in left shoulder: Secondary | ICD-10-CM | POA: Diagnosis not present

## 2024-01-20 DIAGNOSIS — S43422D Sprain of left rotator cuff capsule, subsequent encounter: Secondary | ICD-10-CM | POA: Diagnosis not present

## 2024-01-20 DIAGNOSIS — X58XXXD Exposure to other specified factors, subsequent encounter: Secondary | ICD-10-CM | POA: Diagnosis not present

## 2024-01-21 ENCOUNTER — Ambulatory Visit (INDEPENDENT_AMBULATORY_CARE_PROVIDER_SITE_OTHER)

## 2024-01-21 VITALS — BP 109/72 | HR 74 | Temp 98.3°F | Resp 16 | Ht 61.0 in | Wt 135.8 lb

## 2024-01-21 DIAGNOSIS — D509 Iron deficiency anemia, unspecified: Secondary | ICD-10-CM

## 2024-01-21 DIAGNOSIS — D649 Anemia, unspecified: Secondary | ICD-10-CM

## 2024-01-21 MED ORDER — SODIUM CHLORIDE 0.9 % IV SOLN
510.0000 mg | Freq: Once | INTRAVENOUS | Status: AC
  Administered 2024-01-21: 510 mg via INTRAVENOUS
  Filled 2024-01-21: qty 17

## 2024-01-21 NOTE — Progress Notes (Signed)
 Diagnosis: Iron Deficiency Anemia  Provider:  Praveen Mannam MD  Procedure: IV Infusion  IV Type: Peripheral, IV Location: R Forearm  Feraheme (Ferumoxytol), Dose: 510 mg  Infusion Start Time: 1555  Infusion Stop Time: 1610  Post Infusion IV Care: Observation period completed and Peripheral IV Discontinued  Discharge: Condition: Good, Destination: Home . AVS Provided  Performed by:  Lunell Robart, RN

## 2024-01-21 NOTE — Patient Instructions (Signed)

## 2024-01-28 ENCOUNTER — Ambulatory Visit

## 2024-02-02 DIAGNOSIS — S43422D Sprain of left rotator cuff capsule, subsequent encounter: Secondary | ICD-10-CM | POA: Diagnosis not present

## 2024-02-02 DIAGNOSIS — X58XXXD Exposure to other specified factors, subsequent encounter: Secondary | ICD-10-CM | POA: Diagnosis not present

## 2024-02-02 DIAGNOSIS — M25512 Pain in left shoulder: Secondary | ICD-10-CM | POA: Diagnosis not present

## 2024-02-02 DIAGNOSIS — M25612 Stiffness of left shoulder, not elsewhere classified: Secondary | ICD-10-CM | POA: Diagnosis not present

## 2024-02-04 DIAGNOSIS — X58XXXD Exposure to other specified factors, subsequent encounter: Secondary | ICD-10-CM | POA: Diagnosis not present

## 2024-02-04 DIAGNOSIS — M25612 Stiffness of left shoulder, not elsewhere classified: Secondary | ICD-10-CM | POA: Diagnosis not present

## 2024-02-04 DIAGNOSIS — S43422D Sprain of left rotator cuff capsule, subsequent encounter: Secondary | ICD-10-CM | POA: Diagnosis not present

## 2024-02-04 DIAGNOSIS — M25512 Pain in left shoulder: Secondary | ICD-10-CM | POA: Diagnosis not present

## 2024-02-10 DIAGNOSIS — M25512 Pain in left shoulder: Secondary | ICD-10-CM | POA: Diagnosis not present

## 2024-02-10 DIAGNOSIS — S43422D Sprain of left rotator cuff capsule, subsequent encounter: Secondary | ICD-10-CM | POA: Diagnosis not present

## 2024-02-10 DIAGNOSIS — M25612 Stiffness of left shoulder, not elsewhere classified: Secondary | ICD-10-CM | POA: Diagnosis not present

## 2024-02-10 DIAGNOSIS — X58XXXD Exposure to other specified factors, subsequent encounter: Secondary | ICD-10-CM | POA: Diagnosis not present

## 2024-02-14 ENCOUNTER — Other Ambulatory Visit (HOSPITAL_COMMUNITY): Payer: Self-pay

## 2024-02-19 DIAGNOSIS — Z8616 Personal history of COVID-19: Secondary | ICD-10-CM | POA: Diagnosis not present

## 2024-02-19 DIAGNOSIS — R188 Other ascites: Secondary | ICD-10-CM | POA: Diagnosis not present

## 2024-02-19 DIAGNOSIS — D259 Leiomyoma of uterus, unspecified: Secondary | ICD-10-CM | POA: Diagnosis not present

## 2024-02-19 DIAGNOSIS — R102 Pelvic and perineal pain: Secondary | ICD-10-CM | POA: Diagnosis not present

## 2024-02-19 DIAGNOSIS — Z886 Allergy status to analgesic agent status: Secondary | ICD-10-CM | POA: Diagnosis not present

## 2024-02-19 DIAGNOSIS — Z9884 Bariatric surgery status: Secondary | ICD-10-CM | POA: Diagnosis not present

## 2024-03-22 ENCOUNTER — Other Ambulatory Visit (HOSPITAL_COMMUNITY): Payer: Self-pay

## 2024-03-22 ENCOUNTER — Other Ambulatory Visit: Payer: Self-pay

## 2024-03-22 MED ORDER — WEGOVY 1.7 MG/0.75ML ~~LOC~~ SOAJ
0.7500 mL | SUBCUTANEOUS | 1 refills | Status: DC
  Filled 2024-03-22 (×2): qty 3, 28d supply, fill #0
  Filled 2024-04-14: qty 3, 28d supply, fill #1

## 2024-03-24 DIAGNOSIS — S43422D Sprain of left rotator cuff capsule, subsequent encounter: Secondary | ICD-10-CM | POA: Diagnosis not present

## 2024-03-24 DIAGNOSIS — Z9889 Other specified postprocedural states: Secondary | ICD-10-CM | POA: Diagnosis not present

## 2024-03-28 ENCOUNTER — Telehealth: Payer: Self-pay

## 2024-03-28 NOTE — Telephone Encounter (Signed)
 NA

## 2024-03-28 NOTE — Telephone Encounter (Signed)
 Made in error

## 2024-04-03 ENCOUNTER — Telehealth: Payer: Self-pay | Admitting: Medical

## 2024-04-03 NOTE — Telephone Encounter (Signed)
 [facility] health screening form received and sent back for completion

## 2024-04-03 NOTE — Telephone Encounter (Signed)
 Faxed back form to [facility]

## 2024-04-14 ENCOUNTER — Other Ambulatory Visit: Payer: Self-pay

## 2024-05-31 ENCOUNTER — Other Ambulatory Visit: Payer: Self-pay

## 2024-05-31 ENCOUNTER — Other Ambulatory Visit (HOSPITAL_COMMUNITY): Payer: Self-pay

## 2024-05-31 MED ORDER — WEGOVY 1.7 MG/0.75ML ~~LOC~~ SOAJ
1.7000 mg | SUBCUTANEOUS | 1 refills | Status: AC
  Filled 2024-05-31: qty 3, 28d supply, fill #0
  Filled 2024-06-23: qty 3, 28d supply, fill #1

## 2024-06-23 ENCOUNTER — Other Ambulatory Visit (HOSPITAL_COMMUNITY): Payer: Self-pay

## 2024-06-28 DIAGNOSIS — K912 Postsurgical malabsorption, not elsewhere classified: Secondary | ICD-10-CM | POA: Diagnosis not present

## 2024-06-28 DIAGNOSIS — Z713 Dietary counseling and surveillance: Secondary | ICD-10-CM | POA: Diagnosis not present

## 2024-06-28 DIAGNOSIS — Z9884 Bariatric surgery status: Secondary | ICD-10-CM | POA: Diagnosis not present

## 2024-12-21 ENCOUNTER — Encounter: Payer: Self-pay | Admitting: Medical
